# Patient Record
Sex: Male | Born: 1945 | Race: Black or African American | Hispanic: No | Marital: Single | Smoking: Never smoker
Health system: Southern US, Community
[De-identification: ages and names within clinical notes are randomized; demographics above are authoritative.]

## PROBLEM LIST (undated history)

## (undated) DIAGNOSIS — D126 Benign neoplasm of colon, unspecified: Secondary | ICD-10-CM

## (undated) DIAGNOSIS — N183 Chronic kidney disease, stage 3 unspecified: Secondary | ICD-10-CM

## (undated) DIAGNOSIS — K5909 Other constipation: Secondary | ICD-10-CM

## (undated) DIAGNOSIS — Z86010 Personal history of colonic polyps: Secondary | ICD-10-CM

## (undated) DIAGNOSIS — F4310 Post-traumatic stress disorder, unspecified: Secondary | ICD-10-CM

## (undated) DIAGNOSIS — I10 Essential (primary) hypertension: Secondary | ICD-10-CM

## (undated) DIAGNOSIS — F329 Major depressive disorder, single episode, unspecified: Secondary | ICD-10-CM

## (undated) DIAGNOSIS — K635 Polyp of colon: Secondary | ICD-10-CM

## (undated) HISTORY — DX: Major depressive disorder, single episode, unspecified: F32.9

## (undated) HISTORY — PX: COLONOSCOPY: SHX174

## (undated) HISTORY — DX: Other disorders of bilirubin metabolism: E80.6

## (undated) HISTORY — DX: Post-traumatic stress disorder, unspecified: F43.10

## (undated) HISTORY — DX: Polyp of colon: K63.5

## (undated) HISTORY — DX: Other constipation: K59.09

## (undated) HISTORY — PX: UPPER GASTROINTESTINAL ENDOSCOPY: SHX188

## (undated) HISTORY — DX: Chronic kidney disease, stage 3 unspecified: N18.30

## (undated) HISTORY — DX: Personal history of colonic polyps: Z86.010

## (undated) HISTORY — DX: Essential (primary) hypertension: I10

---
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
## 2010-01-18 ENCOUNTER — Emergency Department (HOSPITAL_COMMUNITY): Admission: EM | Admit: 2010-01-18 | Discharge: 2010-01-18 | Payer: Self-pay | Admitting: Family Medicine

## 2010-02-17 ENCOUNTER — Ambulatory Visit: Payer: Self-pay | Admitting: Internal Medicine

## 2010-02-17 ENCOUNTER — Encounter: Payer: Self-pay | Admitting: Internal Medicine

## 2010-02-17 DIAGNOSIS — I10 Essential (primary) hypertension: Secondary | ICD-10-CM

## 2010-02-17 DIAGNOSIS — R0989 Other specified symptoms and signs involving the circulatory and respiratory systems: Secondary | ICD-10-CM | POA: Insufficient documentation

## 2010-02-17 HISTORY — DX: Essential (primary) hypertension: I10

## 2010-03-07 ENCOUNTER — Telehealth (INDEPENDENT_AMBULATORY_CARE_PROVIDER_SITE_OTHER): Payer: Self-pay | Admitting: *Deleted

## 2010-03-08 ENCOUNTER — Ambulatory Visit: Payer: Self-pay | Admitting: Internal Medicine

## 2010-03-08 LAB — CONVERTED CEMR LAB
AST: 57 units/L — ABNORMAL HIGH (ref 0–37)
Albumin: 4.1 g/dL (ref 3.5–5.2)
CO2: 31 meq/L (ref 19–32)
Chloride: 102 meq/L (ref 96–112)
Glucose, Bld: 106 mg/dL — ABNORMAL HIGH (ref 70–99)
HDL: 44.1 mg/dL (ref >39.00)
PSA: 0.82 ng/mL (ref 0.10–4.00)
Potassium: 4.5 meq/L (ref 3.5–5.1)
Sodium: 141 meq/L (ref 135–145)
TSH: 0.95 microintl units/mL (ref 0.35–5.50)
Total Bilirubin: 0.7 mg/dL (ref 0.3–1.2)
Total CHOL/HDL Ratio: 4
Triglycerides: 61 mg/dL (ref 0.0–149.0)
VLDL: 12.2 mg/dL (ref 0.0–40.0)

## 2010-03-11 ENCOUNTER — Ambulatory Visit: Payer: Self-pay | Admitting: Internal Medicine

## 2010-03-14 ENCOUNTER — Ambulatory Visit: Payer: Self-pay | Admitting: Internal Medicine

## 2010-05-17 NOTE — Assessment & Plan Note (Signed)
Summary: FU/NWS   Vital Signs:  Patient profile:   65 year old male Height:      67 inches Weight:      213 pounds BMI:     33.48 O2 Sat:      99 % on Room air Temp:     98.1 degrees F oral Pulse rate:   56 / minute Pulse rhythm:   regular BP sitting:   118 / 76  (left arm) Cuff size:   large  Vitals Entered By: Rock Nephew CMA (March 14, 2010 4:40 PM)  O2 Flow:  Room air CC: Patient here for follow up/ lab results  Does patient need assistance? Functional Status Self care Ambulation Normal   Primary Care Provider:  Jacques Navy MD  CC:  Patient here for follow up/ lab results.  History of Present Illness: Patient returns for BP follow-up. Reviewed initial H&P with the patient - no additions or corrections.   In the interval he has done well with medication. Generally BP is well controlled. He does report that he has had to take clonidine on occasion but not on a regular basis. He did have follow-up labs Nov 22nd with normal renal function.  Current Medications (verified): 1)  Clonidine Hcl 0.1 Mg Tabs (Clonidine Hcl) .Marland Kitchen.. 1 Tablet Daily 2)  Lisinopril-Hydrochlorothiazide 10-12.5 Mg Tabs (Lisinopril-Hydrochlorothiazide) .Marland Kitchen.. 1 By Mouth Once Daily For Blood Pressure  Allergies (verified): No Known Drug Allergies  Past History:  Past Medical History: Benign colon polyp removed in 1970s possible PTSD - [place] Veteran Hypertension PMH-FH-SH reviewed-no changes except otherwise noted  Review of Systems  The patient denies anorexia, fever, weight loss, weight gain, chest pain, dyspnea on exertion, headaches, abdominal pain, severe indigestion/heartburn, incontinence, suspicious skin lesions, difficulty walking, unusual weight change, enlarged lymph nodes, and breast masses.    Physical Exam  General:  WNWD AA male in no distress Head:  Normocephalic and atraumatic without obvious abnormalities. No apparent alopecia or balding. Eyes:  pupils equal and  pupils round.   Neck:  supple.   Lungs:  Normal respiratory effort, chest expands symmetrically. Lungs are clear to auscultation, no crackles or wheezes. Heart:  Normal rate and regular rhythm. S1 and S2 normal without gallop, murmur, click, rub or other extra sounds. Abdomen:  soft and non-tender.   Neurologic:  alert & oriented X3, gait normal, and DTRs symmetrical and normal.   Skin:  turgor normal and color normal.     Impression & Recommendations:  Problem # 1:  UNSPECIFIED ESSENTIAL HYPERTENSION (ICD-401.9)  His updated medication list for this problem includes:    Clonidine Hcl 0.1 Mg Tabs (Clonidine hcl) .Marland Kitchen... 1 tablet daily    Lisinopril-hydrochlorothiazide 10-12.5 Mg Tabs (Lisinopril-hydrochlorothiazide) .Marland Kitchen... 1 by mouth once daily for blood pressure  BP today: 118/76 Prior BP: 148/92 (02/17/2010)  Labs Reviewed: K+: 4.5 (03/08/2010) Creat: : 1.3 (03/08/2010)     Good control on present medications and will continue the same  Problem # 2:  Preventive Health Care (ICD-V70.0) Reviewed all his labs: normal results including very good lipid panel. He will return in 6 months.   Complete Medication List: 1)  Clonidine Hcl 0.1 Mg Tabs (Clonidine hcl) .Marland Kitchen.. 1 tablet daily 2)  Lisinopril-hydrochlorothiazide 10-12.5 Mg Tabs (Lisinopril-hydrochlorothiazide) .Marland Kitchen.. 1 by mouth once daily for blood pressure Prescriptions: LISINOPRIL-HYDROCHLOROTHIAZIDE 10-12.5 MG TABS (LISINOPRIL-HYDROCHLOROTHIAZIDE) 1 by mouth once daily for blood pressure  #30 x 12   Entered and Authorized by:   Jacques Navy MD  Signed by:   Jacques Navy MD on 03/14/2010   Method used:   Electronically to        [facility] (retail)       [address]       [place], [place]  [id number]       Ph: [phone]       Fax: [phone]   RxID:   [id number] CLONIDINE HCL 0.1 MG TABS (CLONIDINE HCL) 1 tablet daily  #30 x 12   Entered and Authorized by:   Jacques Navy MD    Signed by:   Jacques Navy MD on 03/14/2010   Method used:   Electronically to        [facility] (retail)       [address]       [place], [place]  [id number]       Ph: [phone]       Fax: [phone]   RxID:   [id number]    Orders Added: 1)  Est. Patient Level III [67893]

## 2010-05-17 NOTE — Progress Notes (Signed)
Summary: PT DUE FOR OV  Phone Note From Pharmacy   Caller: [employer] [phone] opt 3 order # [id number] Summary of Call: [facility] is req RF for Simvastatin 20mg . This is not on med list. OK?  Initial call taken by: Lamar Sprinkles, CMA,  March 07, 2010 2:49 PM  Follow-up for Phone Call        not reviewed at his one and only visit. He was to have labs for which he has not returned.  He needs to have labs, including lipid panel and then ROV and then Rx if needed Follow-up by: Jacques Navy MD,  March 07, 2010 5:33 PM  Additional Follow-up for Phone Call Additional follow up Details #1::        No answer on pt's home #. Pt needs return office visit this or next week with Fasting lipid panel, CBC, and LFTs prior  Please help set up, [facility] Additional Follow-up by: Lamar Sprinkles, CMA,  March 07, 2010 5:54 PM    Additional Follow-up for Phone Call Additional follow up Details #2::    Pt has appt for 1128 @3 :10 pm. Pt states he had labwork this am. Follow-up by: Verdell Face,  March 08, 2010 2:39 PM

## 2010-05-17 NOTE — Assessment & Plan Note (Signed)
Summary: NEW / [name]  #   Vital Signs:  Patient profile:   65 year old male Height:      67 inches Weight:      215 pounds BMI:     33.80 O2 Sat:      97 % on Room air Temp:     98.2 degrees F oral Pulse rate:   53 / minute BP sitting:   148 / 92  (left arm) Cuff size:   regular  Vitals Entered By: Bill Salinas CMA (February 17, 2010 1:41 PM)  O2 Flow:  Room air CC: new pt here to est care with primary MD/ ab   CC:  new pt here to est care with primary MD/ ab.  History of Present Illness: [name] comes in today as a new patient to be evaluated for a recent diagnosis of hypertension.  He states that he has never had a problem with blood pressue in the past, but recently he had a friend check his blood pressure with his at home machine and got an elevated reading.  He went to urgent care at [facility] where his blood pressure was taken to be 208/101 and then 210/105 an hour later.  HE denied any symptoms of increase blood pressure at this time.  A BMP was obtained and showed normal values (Cr = 1.4).  He wa placed on Clonidine 0.1 mg and told him to set up a follow up appointment with a primary care physician.    At presentation today his blood pressure was 148/92.  He deniesw any headaches, blurry vision, tinitis, or any other pain.  He does report some feeling of lightheadedness when he stands but only occasionally.  He lives comfortably at home with his 2 brothers.  He is able to perform his ADLs well in taking care of his finances and house.  He interacts well socially but does report some problems with depression secondary to his time in [place] and the resent passing of his mother.     Of note the patients last colonoscopy was back in the 1970s and he had benign colon polyps removed. He has not had a pneumonnia vaccine and denies a flu shot today.  Preventive Screening-Counseling & Management  Alcohol-Tobacco     Alcohol drinks/day: 0     Smoking Status:  never  Caffeine-Diet-Exercise     Does Patient Exercise: no  Hep-HIV-STD-Contraception     Sun Exposure-Excessive: no  Safety-Violence-Falls     Seat Belt Use: yes     Helmet Use: n/a     Firearms in the Home: no firearms in the home     Smoke Detectors: no     Violence in the Home: no risk noted     Sexual Abuse: no     Fall Risk: low fall risk  Current Medications (verified): 1)  Clonidine Hcl 0.1 Mg Tabs (Clonidine Hcl) .Marland Kitchen.. 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 Lives with his 2 brothers Currently sexually active - uses protection Smoking Status:  never Does Patient Exercise:  no Sun Exposure-Excessive:  no Seat Belt Use:  yes Fall Risk:  low fall risk  Review of Systems       Patient complains of some depression - mother passed at the begining of October and has some continual problems since being in [place].  Physical Exam  General:  alert, well-developed, and well-nourished.  alert, well-developed, and well-nourished.   Head:  normocephalic and atraumatic.  normocephalic and atraumatic.   Eyes:  pupils  equal, pupils round, pupils reactive to light, no retinal abnormalitiies, and no nystagmus.  C&S clear.pupils equal, pupils round, pupils reactive to light, no retinal abnormalitiies, and no nystagmus.   Ears:  R ear normal, L ear normal, and no external deformities.  R ear normal, L ear normal, and no external deformities.   Nose:  no external deformity, no external erythema, no nasal discharge, no mucosal pallor, no mucosal edema, and no active bleeding or clots.  no external deformity, no external erythema, no nasal discharge, no mucosal pallor, no mucosal edema, and no active bleeding or clots.   Mouth:  good dentition, pharynx pink and moist, no erythema, and no exudates.  good dentition, pharynx pink and moist, no erythema, and no exudates.   Neck:  supple, full ROM, no masses, no carotid bruits, no cervical lymphadenopathy, and no neck tenderness.  supple, full ROM, no masses, no carotid bruits, no cervical lymphadenopathy, and no neck tenderness.   Chest Wall:  no deformities, no tenderness, and no masses.  no deformities, no tenderness, and no masses.   Lungs:  normal respiratory effort, normal breath sounds, no dullness, no crackles, and no wheezes.  normal respiratory effort, normal breath sounds, no dullness, no crackles, and no wheezes.   Heart:  regular rhythm, no murmur, no gallop, no rub, no thrills, PMI normal, and bradycardia.  1 second capillary refill timeregular rhythm, no murmur, no gallop, no rub, no thrills, PMI normal, and bradycardia.   Abdomen:  soft, non-tender, normal bowel sounds, no distention, no masses, no guarding, no rigidity, no rebound tenderness, no abdominal hernia, and no hepatomegaly.  soft, non-tender, normal bowel sounds, no distention, no masses, no guarding, no rigidity, no rebound tenderness, no abdominal hernia, and no hepatomegaly.   Msk:  normal ROM, no joint tenderness, no joint swelling, no joint warmth, no joint deformities, no crepitation, and no muscle  atrophy.  normal ROM, no joint tenderness, no joint swelling, no joint warmth, no joint deformities, no crepitation, and no muscle atrophy.   Pulses:  R radial normal, R posterior tibial normal, R dorsalis pedis normal, L radial normal, L posterior tibial normal, and L dorsalis pedis normal.  R radial normal, R posterior tibial normal, R dorsalis pedis normal, L radial normal, L posterior tibial normal, and L dorsalis pedis normal.   Neurologic:  alert & oriented X3, cranial nerves II-XII intact, strength normal in all extremities, sensation intact to light touch, sensation intact to pinprick, gait normal, and DTRs symmetrical and normal.  alert & oriented X3, cranial nerves II-XII intact, strength  normal in all extremities, sensation intact to light touch, sensation intact to pinprick, gait normal, and DTRs symmetrical and normal.   Skin:  turgor normal and color normal.  turgor normal and color normal.   Cervical Nodes:  no anterior cervical adenopathy and no posterior cervical adenopathy.  no anterior cervical adenopathy and no posterior cervical adenopathy.   Psych:  Oriented X3, memory intact for recent and remote, normally interactive, good eye contact, and subdued.  Oriented X3, memory intact for recent and remote, normally interactive, and good eye contact.     Impression & Recommendations:  Problem # 1:  UNSPECIFIED ESSENTIAL HYPERTENSION (ICD-401.9) The patient has confirmed hypertension.  A BMP was obtained at the ED visit and showed good renal function.  A 12 Lead EKG was performed today and showed sinus bradycardia with no signs of LVH.  Due to the severity of the rise in his blood pressure at the ED a two drug combination therapy is the best choice to keep his blood pressure under control.  A CXR still needs to be obtained to evaluate for any current heart changes and to establish a baseline for future comparison.  Plan:  CXR today           Lisinopril/HCTZ 10/12.5mg  once a day            Stop taking Clonidine regularly           Take one Clonidine 0.1mg  if blood pressure is >180 systolic or >100 diastolic and call office           LIfestyle changes - monitor diet and increase exercise  His updated medication list for this problem includes:    Clonidine Hcl 0.1 Mg Tabs (Clonidine hcl) .Marland Kitchen... 1 tablet daily    Lisinopril-hydrochlorothiazide 10-12.5 Mg Tabs (Lisinopril-hydrochlorothiazide) .Marland Kitchen... 1 by mouth once daily for blood pressure  Orders: T-2 View CXR (71020TC)  His updated medication list for this problem includes:    Clonidine Hcl 0.1 Mg Tabs (Clonidine hcl) .Marland Kitchen... 1 tablet daily    Lisinopril-hydrochlorothiazide 10-12.5 Mg Tabs (Lisinopril-hydrochlorothiazide) .Marland Kitchen... 1 by mouth once daily for blood pressure  Problem # 2:  GRIEF REACTION (ICD-309.0) His mother passed about a month ago and has been dealing with his greif over the loss, but does seem to be more depressed than normal.  Due to his history in [place] and possible PTSD he should be monitored for any other signs of depression.  Plan:  Re-evaluate at next office visit in 3 weeks           Call if needed  Problem # 3:  Preventive Health Care (ICD-V70.0) Patient is an over-weight African American male who presents to day with a new diagnosis of hypertension which will now be managed with lisinopril and HCTZ.    The focus of this visit was mainly to get his hypertension under control and more preventative health care will be addressed.  He does report being functional in his ADLs and can maintain his finances well.  He is capable of normal social interaction.  Some history of depression that should be evaluated on next visit.    Plan:  Follow up in 3 weeks           Fasting lipid panel, CBC, and TFTs prior to next office visit  Complete Medication List: 1)  Clonidine Hcl 0.1 Mg Tabs (Clonidine hcl) .Marland Kitchen.. 1 tablet daily 2)  Lisinopril-hydrochlorothiazide 10-12.5 Mg Tabs (Lisinopril-hydrochlorothiazide) .Marland Kitchen.. 1 by  mouth once daily for blood  pressure  Patient Instructions: 1)  Newly diagnosed hypertension - will start lisinopril/hct 10/12.5 mg once a day. Monitor your blood pressure at home. If the top number is greater than 180 and/or the bottom number is greater than 100 you should take a clonidine. If this happens frequently you need to call me. You will return for a follow-up visit in 3 weeks with lab work 2 working days before your visit. Call at anytime with questions: [phone], option #3, option #1 and leave a message.  2)  Health maintenance - will get routine labs when you have your blood drawn and we will talk about followp-up colonoscopy. Prescriptions: LISINOPRIL-HYDROCHLOROTHIAZIDE 10-12.5 MG TABS (LISINOPRIL-HYDROCHLOROTHIAZIDE) 1 by mouth once daily for blood pressure  #30 x 12   Entered and Authorized by:   Jacques Navy MD   Signed by:   Jacques Navy MD on 02/17/2010   Method used:   Electronically to        [facility] (retail)       [address]       [place], [place]  [id number]       Ph: [phone]       Fax: [phone]   RxID:   [id number]    Orders Added: 1)  T-2 View CXR [[id number]] 2)  New Patient Level III [99203]   DG CHEST 2 VIEW - [id number]   Clinical Data: Unspecified essential hypertension.   CHEST - 2 VIEW 02/17/2010:   Comparison: None.   Findings: Cardiac silhouette normal in size.  Thoracic aorta tortuous, consistent with the history of hypertension.  Hilar and mediastinal contours otherwise unremarkable.  Lungs clear. Bronchovascular markings normal.  No pleural effusions.  Visualized bony thorax intact with mild degenerative changes.   IMPRESSION: No acute cardiopulmonary disease.   Read By:  Arnell Sieving,  Judie Petit.D.

## 2010-06-30 LAB — POCT I-STAT, CHEM 8
Calcium, Ion: 1.2 mmol/L (ref 1.12–1.32)
Chloride: 104 mEq/L (ref 96–112)
Glucose, Bld: 96 mg/dL (ref 70–99)
HCT: 49 % (ref 39.0–52.0)
Hemoglobin: 16.7 g/dL (ref 13.0–17.0)
Potassium: 4.2 mEq/L (ref 3.5–5.1)

## 2010-12-15 ENCOUNTER — Encounter: Payer: Self-pay | Admitting: Internal Medicine

## 2010-12-15 ENCOUNTER — Ambulatory Visit (INDEPENDENT_AMBULATORY_CARE_PROVIDER_SITE_OTHER): Payer: BC Managed Care – PPO | Admitting: Internal Medicine

## 2010-12-15 ENCOUNTER — Other Ambulatory Visit (INDEPENDENT_AMBULATORY_CARE_PROVIDER_SITE_OTHER): Payer: BC Managed Care – PPO

## 2010-12-15 VITALS — BP 128/82 | HR 61 | Temp 97.8°F | Wt 207.0 lb

## 2010-12-15 DIAGNOSIS — I10 Essential (primary) hypertension: Secondary | ICD-10-CM

## 2010-12-15 DIAGNOSIS — K648 Other hemorrhoids: Secondary | ICD-10-CM

## 2010-12-15 LAB — COMPREHENSIVE METABOLIC PANEL
ALT: 24 U/L (ref 0–53)
AST: 30 U/L (ref 0–37)
CO2: 30 mEq/L (ref 19–32)
Chloride: 100 mEq/L (ref 96–112)
Creatinine, Ser: 1.5 mg/dL (ref 0.4–1.5)
Sodium: 137 mEq/L (ref 135–145)
Total Bilirubin: 0.7 mg/dL (ref 0.3–1.2)
Total Protein: 8 g/dL (ref 6.0–8.3)

## 2010-12-15 MED ORDER — HYDROCORTISONE ACETATE 25 MG RE SUPP: 25.0000 mg | Freq: Two times a day (BID) | RECTAL | Status: AC

## 2010-12-15 NOTE — Progress Notes (Signed)
  Subjective:    Patient ID: [name], male    DOB: 24-May-1945, 65 y.o.   MRN: [id number]  HPI [name] presents for evaluation of a nodule at the anus. It is a painful. No bleeding. He also has an irregular bowel habit which does do better with dulcolax or fiber.  He is followed for blood pressure - doing very well on his present medications.  Past Medical History  Diagnosis Date  . Benign colon polyp   . PTSD (post-traumatic stress disorder)     possible [place] vet  . Hypertension    No past surgical history on file. No family history on file. History   Social History  . Marital Status: Single    Spouse Name: N/A    Number of Children: N/A  . Years of Education: N/A   Occupational History  . Not on file.   Social History Main Topics  . Smoking status: Not on file  . Smokeless tobacco: Not on file  . Alcohol Use: Not on file  . Drug Use: Not on file  . Sexually Active: Not on file   Other Topics Concern  . 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 NST. Firm nodule internal to the sphincter that is not tender.       Assessment & Plan:  Internal hemorrhoid - no frank bleeding but some blood on tissue with BM and a hesitancy in BMs.  Plan - routine care: bulk laxatives, sitz bath for flare, anusol HC 25 mg suppositories as needed.

## 2010-12-15 NOTE — Patient Instructions (Signed)
Blood pressure is very well controlled. Please continue your present medication. Will check routine lab today.  Hemorrhoid - internal hemorrhoid at the 5:00 position. Plan - a bulk laxative, i.e. Metamucil, fibercon, etc, daily to promote an easy BM without strain. Anusol cortisone suppository for the hemorrhoid: take a sitz bath or hold a warm washcloth against the anus for 5 minutes or so then use the suppository.    Hemorrhoids Hemorrhoids are dilated (enlarged) veins around the rectum. Sometimes clots will form in the veins. This makes them swollen and painful. These are called thrombosed hemorrhoids. Causes of hemorrhoids include:  Pregnancy: this increases the pressure in the hemorrhoidal veins.   Constipation.   Straining to have a bowel movement.  HOME CARE INSTRUCTIONS  Eat a well balanced diet and drink 6 to 8 glasses of water every day to avoid constipation. You may also use a bulk laxative.   Avoid straining to have bowel movements.   Keep anal area dry and clean.   Only take over-the-counter or prescription medicines for pain, discomfort, or fever as directed by your caregiver.  If thrombosed:  Take hot sitz baths for 20 to 30 minutes, 3 to 4 times per day.   If the hemorrhoids are very tender and swollen, place ice packs on area as tolerated. Using ice packs between sitz baths may be helpful. Fill a plastic bag with ice and use a towel between the bag of ice and your skin.   Special creams and suppositories (Anusol, Nupercainal, Wyanoids) may be used or applied as directed.   Do not use a donut shaped pillow or sit on the toilet for long periods. This increases blood pooling and pain.   Move your bowels when your body has the urge; this will require less straining and will decrease pain and pressure.   Only take over-the-counter or prescription medicines for pain, discomfort, or fever as directed by your caregiver.  SEEK MEDICAL CARE IF:  You have increasing pain  and swelling that is not controlled with your prescription.   You have uncontrolled bleeding.   You have an inability or difficulty having a bowel movement.   You have pain or inflammation outside the area of the hemorrhoids.   You have chills and/or an oral temperature above 100 that lasts for 2 days or longer, or as your caregiver suggests.  MAKE SURE YOU:    Understand these instructions.   Will watch your condition.   Will get help right away if you are not doing well or get worse.  Document Released: 03/31/2000 Document Re-Released: 03/16/2008 [facility] Patient Information 2011 [place], [place].       What is this medicine? HYDROCORTISONE (hye droe KOR ti sone) is a corticosteroid. It is used to decrease swelling, itching, and pain that is caused by minor skin irritations or by hemorrhoids. This medicine may be used for other purposes; ask your health care Beryle Bagsby or pharmacist if you have questions.   What should I tell my health care Heidee Audi before I take this medicine? They need to know if you have any of these conditions: -an unusual or allergic reaction to hydrocortisone, corticosteroids, other medicines, foods, dyes, or preservatives -pregnant or trying to get pregnant -breast-feeding   How should I use this medicine? This medicine is for rectal use only. Do not take by mouth. Wash your hands before and after use. Take off the foil wrapping. Wet the tip of the suppository with cold tap water to make it easier to  use. Lie on your side with your lower leg straightened out and your upper leg bent forward toward your stomach. Lift upper buttock to expose the rectal area. Apply gentle pressure to insert the suppository completely into the rectum, pointed end first. Hold buttocks together for a few seconds. Remain lying down for about 15 minutes to avoid having the suppository come out. Do not use more often than directed.   Talk to your pediatrician regarding the use of this  medicine in children. Special care may be needed.   Overdosage: If you think you have taken too much of this medicine contact a poison control center or emergency room at once. NOTE: This medicine is only for you. Do not share this medicine with others.   What if I miss a dose? If you miss a dose, use it as soon as you can. If it is almost time for your next dose, use only that dose. Do not use double or extra doses.   What may interact with this medicine? Interactions are not expected. Do not use any other rectal products on the affected area without telling your doctor or health care professional.   This list may not describe all possible interactions. Give your health care Corbin Hott a list of all the medicines, herbs, non-prescription drugs, or dietary supplements you use. Also tell them if you smoke, drink alcohol, or use illegal drugs. Some items may interact with your medicine.   What should I watch for while using this medicine? Visit your doctor or health care professional for regular checks on your progress. Tell your doctor or health care professional if your symptoms do not improve after a few days of use. Do not use if there is blood in your stools.   If you get any type of infection while using this medicine, you may need to stop using this medicine until our infections clears up. Ask your doctor or health care professional for advice.   What side effects may I notice from receiving this medicine? Side effects that you should report to your doctor or health care professional as soon as possible: -bloody or black, tarry stools -painful, red, pus filled blisters in hair follicles -rectal pain, burning or bleeding after use of medicine   Side effects that usually do not require medical attention (report to your doctor or health care professional if they continue or are bothersome): -changes in skin color -dry skin -itching or irritation   This list may not describe all possible  side effects. Call your doctor for medical advice about side effects. You may report side effects to FDA at 1-800-FDA-1088.   Where should I keep my medicine? Keep out of the reach of children.   Store at room temperature between 20 and 25 degrees C (68 and 77 degrees F). Protect from heat and freezing. Throw away any unused medicine after the expiration date.   NOTE:This sheet is a summary. It may not cover all possible information. If you have questions about this medicine, talk to your doctor, pharmacist, or health care Musa Rewerts.      2011, [employer] Standard.

## 2010-12-17 NOTE — Assessment & Plan Note (Signed)
BP Readings from Last 3 Encounters:  12/15/10 128/82  03/14/10 118/76  02/17/10 148/92   Good control. Continue present regimen.

## 2010-12-19 ENCOUNTER — Encounter: Payer: Self-pay | Admitting: Internal Medicine

## 2011-03-22 ENCOUNTER — Other Ambulatory Visit: Payer: Self-pay | Admitting: *Deleted

## 2011-03-22 NOTE — Telephone Encounter (Signed)
Opened in error

## 2011-03-27 ENCOUNTER — Other Ambulatory Visit: Payer: Self-pay | Admitting: *Deleted

## 2011-03-28 ENCOUNTER — Other Ambulatory Visit: Payer: Self-pay | Admitting: Internal Medicine

## 2011-03-28 MED ORDER — LISINOPRIL-HYDROCHLOROTHIAZIDE 10-12.5 MG PO TABS: 1.0000 | ORAL_TABLET | Freq: Every day | ORAL | Status: DC

## 2011-03-28 NOTE — Telephone Encounter (Signed)
Rx faxed into [facility] on [address].

## 2011-04-27 ENCOUNTER — Other Ambulatory Visit: Payer: Self-pay | Admitting: *Deleted

## 2011-04-27 MED ORDER — SIMVASTATIN 20 MG PO TABS: 20.0000 mg | ORAL_TABLET | Freq: Every day | ORAL | Status: DC

## 2011-04-27 MED ORDER — LOSARTAN POTASSIUM 50 MG PO TABS: 50.0000 mg | ORAL_TABLET | Freq: Every day | ORAL | Status: DC

## 2011-05-15 ENCOUNTER — Other Ambulatory Visit: Payer: Self-pay | Admitting: *Deleted

## 2011-05-15 MED ORDER — LOSARTAN POTASSIUM 50 MG PO TABS: 50.0000 mg | ORAL_TABLET | Freq: Every day | ORAL | Status: DC

## 2012-01-29 ENCOUNTER — Other Ambulatory Visit: Payer: Self-pay | Admitting: Internal Medicine

## 2012-04-17 DIAGNOSIS — D126 Benign neoplasm of colon, unspecified: Secondary | ICD-10-CM

## 2012-04-17 HISTORY — DX: Benign neoplasm of colon, unspecified: D12.6

## 2013-01-30 ENCOUNTER — Telehealth: Payer: Self-pay | Admitting: Internal Medicine

## 2013-01-30 NOTE — Telephone Encounter (Signed)
Pt called to say his BP is elevated. 121/113 this morning around 8:30.  LOV 2012.  He is also going to call the [place] where he has been seeing someone.

## 2013-01-30 NOTE — Telephone Encounter (Signed)
Pt walked into [facility] states his BP was elevated.  BP checked 122/78 left, 126/80 right.  Pt has been taking Nifedipine 30mg , last dose approx 9am.  Pt scheduled appoint with Dr Debby Bud on 10.20.14 at 11:30am

## 2013-02-03 ENCOUNTER — Ambulatory Visit (INDEPENDENT_AMBULATORY_CARE_PROVIDER_SITE_OTHER): Payer: BC Managed Care – PPO | Admitting: Internal Medicine

## 2013-02-03 VITALS — BP 118/82 | HR 62 | Temp 98.6°F | Wt 205.0 lb

## 2013-02-03 DIAGNOSIS — F4310 Post-traumatic stress disorder, unspecified: Secondary | ICD-10-CM | POA: Insufficient documentation

## 2013-02-03 DIAGNOSIS — I10 Essential (primary) hypertension: Secondary | ICD-10-CM

## 2013-02-03 NOTE — Assessment & Plan Note (Signed)
Patient reports dx made at [facility] and has been seeking talk therapy there. Patient also reports that they recommended a medication to help him sleep (maybe Prazosin?), but has not taken it. Significant symptoms of depression--admits to past SI, low energy, periods of low mood.   Plan:  Will more fully assess at follow up CPE.

## 2013-02-03 NOTE — Assessment & Plan Note (Addendum)
Blood pressure is excellent today. Unclear exactly how his BPs have been in the past since he has been seen outside of this office.   Plan:  Will recheck at follow-up CPE.

## 2013-02-03 NOTE — Progress Notes (Signed)
Subjective:     Patient ID: [name], male   DOB: [birth year], 67 y.o.   MRN: [id number]  HPI [name] is a 67 year old man with a history of HTN and PTSD who presents for evaluation of his blood pressure. He has been seeing his doctor at the [place] in [place] and was last here about 2 years ago. He checked his BP at a friend's and it was high--maybe about 180s. Last week, a nurse checked his BP here and reported that it was ok.   He was taken off of his lisinopril-HCTZ 6 months ago at the [place] for angioedema--"my face and tongue were very swollen." He was started on nifedipine 30 mg once daily.   He does admit to an intermittent HA 1-2x daily. They feel like feeling "off or not clear" but there is no pain. During these headaches, his vision gets slightly burred. He has not taken any medicines for these HA, but the doctors at the [place] recommended that he started a medicine for sleep--maybe Prazosin. No photophobia or N/V. No hearing racing or chest palpitations.   He admits to some periods of low mood, and at times is less able to enjoy his normal activities. He feels like he has low energy. He has been diagnosed with PTSD in the past and has been receiving talk therapy with the [place]. He admits to some suicidal thoughts in the past during his periods of low mood, most recently 4-5 months ago. He recently called for another appointment at the [place], but does not have one currently scheduled.   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     Objective:   Physical Exam Filed Vitals:   02/03/13 1209  BP: 118/82  Pulse: 62  Temp: 98.6 F (37 C)   MSE: Affect is flat. Thought content includes concerns over his blood pressure. No behaviors concerning for responding to internal stimuli. Does admit to past SI, but denies any current SI.     Assessment:     [name] is a 67 year old  man with a history of HTN and depression who presents for a blood pressure recheck. Given the many changes in his medical history since he was last here, he will be scheduled for a CPE later this week or early next week.     Plan:     See plan by problem list.

## 2013-02-11 ENCOUNTER — Ambulatory Visit: Payer: BC Managed Care – PPO | Admitting: Internal Medicine

## 2013-02-11 DIAGNOSIS — Z0289 Encounter for other administrative examinations: Secondary | ICD-10-CM

## 2013-10-21 ENCOUNTER — Telehealth: Payer: Self-pay | Admitting: *Deleted

## 2013-10-21 NOTE — Telephone Encounter (Signed)
Ok with me 

## 2013-10-21 NOTE — Telephone Encounter (Signed)
Patient would like to know if Dr Jenny Reichmann will accept him as a new patient.  Patient is a former Dr Norrins patient and would come the same day as his wife [name].  Mrs Valere Dross has already scheduled an appointment with Dr Jenny Reichmann on 12/30/13 and Mr Hallum would like to do the same if possible?

## 2013-12-30 ENCOUNTER — Encounter: Payer: Self-pay | Admitting: Internal Medicine

## 2013-12-30 ENCOUNTER — Ambulatory Visit (INDEPENDENT_AMBULATORY_CARE_PROVIDER_SITE_OTHER): Payer: BC Managed Care – PPO | Admitting: Internal Medicine

## 2013-12-30 VITALS — BP 118/60 | HR 64 | Temp 98.2°F | Ht 67.0 in | Wt 217.0 lb

## 2013-12-30 DIAGNOSIS — F329 Major depressive disorder, single episode, unspecified: Secondary | ICD-10-CM

## 2013-12-30 DIAGNOSIS — Z0000 Encounter for general adult medical examination without abnormal findings: Secondary | ICD-10-CM

## 2013-12-30 DIAGNOSIS — Z860100 Personal history of colon polyps, unspecified: Secondary | ICD-10-CM

## 2013-12-30 DIAGNOSIS — I10 Essential (primary) hypertension: Secondary | ICD-10-CM

## 2013-12-30 DIAGNOSIS — F3289 Other specified depressive episodes: Secondary | ICD-10-CM

## 2013-12-30 DIAGNOSIS — F32A Depression, unspecified: Secondary | ICD-10-CM

## 2013-12-30 DIAGNOSIS — Z0001 Encounter for general adult medical examination with abnormal findings: Secondary | ICD-10-CM | POA: Insufficient documentation

## 2013-12-30 HISTORY — DX: Major depressive disorder, single episode, unspecified: F32.9

## 2013-12-30 HISTORY — DX: Personal history of colon polyps, unspecified: Z86.0100

## 2013-12-30 HISTORY — DX: Personal history of colonic polyps: Z86.010

## 2013-12-30 MED ORDER — ASPIRIN EC 81 MG PO TBEC: 81.0000 mg | DELAYED_RELEASE_TABLET | Freq: Every day | ORAL | Status: DC

## 2013-12-30 NOTE — Progress Notes (Signed)
Pre visit review using our clinic review tool, if applicable. No additional management support is needed unless otherwise documented below in the visit note. 

## 2013-12-30 NOTE — Assessment & Plan Note (Signed)
stable overall by history and exam, recent data reviewed with pt, and pt to continue medical treatment as before,  to f/u any worsening symptoms or concerns BP Readings from Last 3 Encounters:  12/30/13 118/60  02/03/13 118/82  12/15/10 128/82

## 2013-12-30 NOTE — Patient Instructions (Signed)
Please continue all other medications as before  Please have the pharmacy call with any other refills you may need.  Please continue your efforts at being more active, low cholesterol diet, and weight control.  You are otherwise up to date with prevention measures today.  Please keep your appointments with your specialists as you may have planned  Please forward any Lab work from the [place], or bring to your next visit  Please return in 1 year for your yearly visit, or sooner if needed, with Lab testing done 3-5 days before

## 2013-12-30 NOTE — Progress Notes (Signed)
Subjective:    Patient ID: [name], male    DOB: 03/12/1946, 68 y.o.   MRN: [id number]  HPI  /Here for wellness and f/u;  Overall doing ok;  Pt denies CP, worsening SOB, DOE, wheezing, orthopnea, PND, worsening LE edema, palpitations, dizziness or syncope.  Pt denies neurological change such as new headache, facial or extremity weakness.  Pt denies polydipsia, polyuria, or low sugar symptoms. Pt states overall good compliance with treatment and medications, good tolerability, and has been trying to follow lower cholesterol diet.  Pt denies worsening depressive symptoms, suicidal ideation or panic. No fever, night sweats, wt loss, loss of appetite, or other constitutional symptoms.  Pt states good ability with ADL's, has low fall risk, home safety reviewed and adequate, no other significant changes in hearing or vision, and only occasionally active with exercise. Declines immunizations. Does not want albs today, but may do later this wk  Had some labs a few mo ago at the [place].  No complaints Past Medical History  Diagnosis Date  . Benign colon polyp   . PTSD (post-traumatic stress disorder)     possible [place] vet  . Hypertension   . Personal history of colonic polyps 12/30/2013    Per colonoscopy at [facility] per pt, about sept 2014  . Depression 12/30/2013   No past surgical history on file.  reports that he has quit smoking. He does not have any smokeless tobacco history on file. He reports that he does not drink alcohol or use illicit drugs. family history includes Hypertension in an other family member. Allergies  Allergen Reactions  . Lisinopril     Angioedema? Patient reports that his tongue and mouth became very swollen, was seen at [facility] for this in 2014.    No current outpatient prescriptions on file prior to visit.   No current facility-administered medications on file prior to visit.   Review of Systems Constitutional: Negative for increased diaphoresis, other activity, appetite  or other siginficant weight change  HENT: Negative for worsening hearing loss, ear pain, facial swelling, mouth sores and neck stiffness.   Eyes: Negative for other worsening pain, redness or visual disturbance.  Respiratory: Negative for shortness of breath and wheezing.   Cardiovascular: Negative for chest pain and palpitations.  Gastrointestinal: Negative for diarrhea, blood in stool, abdominal distention or other pain Genitourinary: Negative for hematuria, flank pain or change in urine volume.  Musculoskeletal: Negative for myalgias or other joint complaints.  Skin: Negative for color change and wound.  Neurological: Negative for syncope and numbness. other than noted Hematological: Negative for adenopathy. or other swelling Psychiatric/Behavioral: Negative for hallucinations, self-injury, decreased concentration or other worsening agitation.      Objective:   Physical Exam BP 118/60  Pulse 64  Temp(Src) 98.2 F (36.8 C) (Oral)  Ht 5\' 7"  (1.702 m)  Wt 217 lb (98.431 kg)  BMI 33.98 kg/m2  SpO2 95% VS noted,  Constitutional: Pt is oriented to person, place, and time. Appears well-developed and well-nourished.  Head: Normocephalic and atraumatic.  Right Ear: External ear normal.  Left Ear: External ear normal.  Nose: Nose normal.  Mouth/Throat: Oropharynx is clear and moist.  Eyes: Conjunctivae and EOM are normal. Pupils are equal, round, and reactive to light.  Neck: Normal range of motion. Neck supple. No JVD present. No tracheal deviation present.  Cardiovascular: Normal rate, regular rhythm, normal heart sounds and intact distal pulses.   Pulmonary/Chest: Effort normal and breath sounds without rales or wheezing  Abdominal:  Soft. Bowel sounds are normal. NT. No HSM  Musculoskeletal: Normal range of motion. Exhibits no edema.  Lymphadenopathy:  Has no cervical adenopathy.  Neurological: Pt is alert and oriented to person, place, and time. Pt has normal reflexes. No cranial  nerve deficit. Motor grossly intact Skin: Skin is warm and dry. No rash noted.  Psychiatric:  Has depressed mood and affect. Behavior is normal.     Assessment & Plan:

## 2013-12-30 NOTE — Assessment & Plan Note (Signed)
Mild persistent, is on what sounds like anti-depressant from the [place], cant recall name, declines any change

## 2013-12-30 NOTE — Assessment & Plan Note (Addendum)

## 2014-09-15 ENCOUNTER — Ambulatory Visit: Payer: BC Managed Care – PPO | Admitting: Internal Medicine

## 2014-09-15 DIAGNOSIS — R4689 Other symptoms and signs involving appearance and behavior: Secondary | ICD-10-CM | POA: Insufficient documentation

## 2014-09-15 DIAGNOSIS — Z0289 Encounter for other administrative examinations: Secondary | ICD-10-CM

## 2015-01-13 ENCOUNTER — Encounter: Payer: BC Managed Care – PPO | Admitting: Internal Medicine

## 2015-01-14 ENCOUNTER — Encounter: Payer: Self-pay | Admitting: Internal Medicine

## 2015-01-14 ENCOUNTER — Ambulatory Visit (INDEPENDENT_AMBULATORY_CARE_PROVIDER_SITE_OTHER): Payer: BC Managed Care – PPO | Admitting: Internal Medicine

## 2015-01-14 ENCOUNTER — Other Ambulatory Visit (INDEPENDENT_AMBULATORY_CARE_PROVIDER_SITE_OTHER): Payer: BC Managed Care – PPO

## 2015-01-14 VITALS — BP 126/86 | HR 67 | Temp 97.8°F | Ht 67.0 in | Wt 219.0 lb

## 2015-01-14 DIAGNOSIS — I10 Essential (primary) hypertension: Secondary | ICD-10-CM | POA: Diagnosis not present

## 2015-01-14 DIAGNOSIS — F32A Depression, unspecified: Secondary | ICD-10-CM

## 2015-01-14 DIAGNOSIS — F329 Major depressive disorder, single episode, unspecified: Secondary | ICD-10-CM

## 2015-01-14 DIAGNOSIS — Z86010 Personal history of colonic polyps: Secondary | ICD-10-CM | POA: Diagnosis not present

## 2015-01-14 DIAGNOSIS — Z0000 Encounter for general adult medical examination without abnormal findings: Secondary | ICD-10-CM | POA: Diagnosis not present

## 2015-01-14 LAB — HEPATIC FUNCTION PANEL
ALBUMIN: 4 g/dL (ref 3.5–5.2)
ALK PHOS: 63 U/L (ref 39–117)
ALT: 24 U/L (ref 0–53)
AST: 30 U/L (ref 0–37)
Bilirubin, Direct: 0.2 mg/dL (ref 0.0–0.3)
Total Bilirubin: 0.7 mg/dL (ref 0.2–1.2)
Total Protein: 7.1 g/dL (ref 6.0–8.3)

## 2015-01-14 LAB — URINALYSIS, ROUTINE W REFLEX MICROSCOPIC
Bilirubin Urine: NEGATIVE
HGB URINE DIPSTICK: NEGATIVE
Ketones, ur: NEGATIVE
LEUKOCYTES UA: NEGATIVE
Nitrite: NEGATIVE
Specific Gravity, Urine: 1.025 (ref 1.000–1.030)
TOTAL PROTEIN, URINE-UPE24: NEGATIVE
URINE GLUCOSE: NEGATIVE
UROBILINOGEN UA: 0.2 (ref 0.0–1.0)
pH: 6 (ref 5.0–8.0)

## 2015-01-14 LAB — CBC WITH DIFFERENTIAL/PLATELET
BASOS ABS: 0 10*3/uL (ref 0.0–0.1)
Basophils Relative: 0.5 % (ref 0.0–3.0)
EOS ABS: 0.1 10*3/uL (ref 0.0–0.7)
Eosinophils Relative: 1.1 % (ref 0.0–5.0)
HEMATOCRIT: 41.9 % (ref 39.0–52.0)
HEMOGLOBIN: 13.7 g/dL (ref 13.0–17.0)
LYMPHS PCT: 25.9 % (ref 12.0–46.0)
Lymphs Abs: 1.2 10*3/uL (ref 0.7–4.0)
MCHC: 32.6 g/dL (ref 30.0–36.0)
MCV: 88.4 fl (ref 78.0–100.0)
MONOS PCT: 8.5 % (ref 3.0–12.0)
Monocytes Absolute: 0.4 10*3/uL (ref 0.1–1.0)
Neutro Abs: 3 10*3/uL (ref 1.4–7.7)
Neutrophils Relative %: 64 % (ref 43.0–77.0)
Platelets: 147 10*3/uL — ABNORMAL LOW (ref 150.0–400.0)
RBC: 4.74 Mil/uL (ref 4.22–5.81)
RDW: 15 % (ref 11.5–15.5)
WBC: 4.6 10*3/uL (ref 4.0–10.5)

## 2015-01-14 LAB — LIPID PANEL
CHOLESTEROL: 148 mg/dL (ref 0–200)
HDL: 48.9 mg/dL (ref >39.00)
LDL CALC: 82 mg/dL (ref 0–99)
NonHDL: 98.98
Total CHOL/HDL Ratio: 3
Triglycerides: 83 mg/dL (ref 0.0–149.0)
VLDL: 16.6 mg/dL (ref 0.0–40.0)

## 2015-01-14 LAB — BASIC METABOLIC PANEL
BUN: 10 mg/dL (ref 6–23)
CALCIUM: 9 mg/dL (ref 8.4–10.5)
CO2: 30 mEq/L (ref 19–32)
CREATININE: 1.53 mg/dL — AB (ref 0.40–1.50)
Chloride: 103 mEq/L (ref 96–112)
GFR: 58.14 mL/min — AB (ref >60.00)
GLUCOSE: 91 mg/dL (ref 70–99)
Potassium: 3.7 mEq/L (ref 3.5–5.1)
SODIUM: 139 meq/L (ref 135–145)

## 2015-01-14 LAB — TSH: TSH: 1.07 u[IU]/mL (ref 0.35–4.50)

## 2015-01-14 LAB — PSA: PSA: 0.8 ng/mL (ref 0.10–4.00)

## 2015-01-14 MED ORDER — ESCITALOPRAM OXALATE 10 MG PO TABS: 10.0000 mg | ORAL_TABLET | Freq: Every day | ORAL | Status: DC

## 2015-01-14 MED ORDER — NIFEDIPINE ER OSMOTIC RELEASE 30 MG PO TB24: 30.0000 mg | ORAL_TABLET | Freq: Every day | ORAL | Status: DC

## 2015-01-14 NOTE — Patient Instructions (Addendum)
Please return for a Nurse visit if you change your mind about the pneumonia, tetanus (Td) or flu shots  Your EKG was [facility] today  You will be contacted regarding the referral for: colonoscopy  Please continue all other medications as before, and refills have been done if requested.  Please have the pharmacy call with any other refills you may need.  Please continue your efforts at being more active, low cholesterol diet, and weight control.  You are otherwise up to date with prevention measures today.  Please keep your appointments with your specialists as you may have planned  Please go to the LAB in the Basement (turn left off the elevator) for the tests to be done today  You will be contacted by phone if any changes need to be made immediately.  Otherwise, you will receive a letter about your results with an explanation, but please check with MyChart first.  Please remember to sign up for MyChart if you have not done so, as this will be important to you in the future with finding out test results, communicating by private email, and scheduling acute appointments online when needed.  Please return in 1 year for your yearly visit, or sooner if needed, with Lab testing done 3-5 days before

## 2015-01-14 NOTE — Assessment & Plan Note (Signed)
Ok for tx for lexapro 10 mg daily, declines referral counseling,  to f/u any worsening symptoms or concerns

## 2015-01-14 NOTE — Assessment & Plan Note (Signed)
stable overall by history and exam, recent data reviewed with pt, and pt to continue medical treatment as before,  to f/u any worsening symptoms or concerns bke BP Readings from Last 3 Encounters:  01/14/15 126/86  12/30/13 118/60  02/03/13 118/82

## 2015-01-14 NOTE — Progress Notes (Signed)
Pre visit review using our clinic review tool, if applicable. No additional management support is needed unless otherwise documented below in the visit note. 

## 2015-01-14 NOTE — Assessment & Plan Note (Signed)
Pt now states due for colonsocpy - ok for referral

## 2015-01-14 NOTE — Assessment & Plan Note (Signed)

## 2015-01-14 NOTE — Progress Notes (Signed)
Subjective:    Patient ID: [name], male    DOB: [birth year], 69 y.o.   MRN: [id number]  HPI Here for wellness and f/u;  Overall doing ok;  Pt denies Chest pain, worsening SOB, DOE, wheezing, orthopnea, PND, worsening LE edema, palpitations, dizziness or syncope.  Pt denies neurological change such as new headache, facial or extremity weakness.  Pt denies polydipsia, polyuria, or low sugar symptoms. Pt states overall good compliance with treatment and medications, good tolerability, and has been trying to follow appropriate diet.  Pt has had mild 2 wks worsening depressive symptoms, but no suicidal ideation or panic. No fever, night sweats, wt loss, loss of appetite, or other constitutional symptoms.  Pt states good ability with ADL's, has low fall risk, home safety reviewed and adequate, no other significant changes in hearing or vision, and only occasionally active with exercise. No complaints  Declines flu shot or other immunizations.  Past Medical History  Diagnosis Date  . Benign colon polyp   . PTSD (post-traumatic stress disorder)     possible [place] vet  . Hypertension   . Personal history of colonic polyps 12/30/2013    Per colonoscopy at [facility] per pt, about sept 2014  . Depression 12/30/2013   No past surgical history on file.  reports that he has never smoked. He does not have any smokeless tobacco history on file. He reports that he does not drink alcohol or use illicit drugs. family history includes Hypertension in an other family member. Allergies  Allergen Reactions  . Lisinopril     Angioedema? Patient reports that his tongue and mouth became very swollen, was seen at [facility] for this in 2014.    Current Outpatient Prescriptions on File Prior to Visit  Medication Sig Dispense Refill  . aspirin EC 81 MG tablet Take 1 tablet (81 mg total) by mouth daily. 90 tablet 11   No current facility-administered medications on file prior to visit.   Review of Systems Constitutional:  Negative for increased diaphoresis, other activity, appetite or siginficant weight change other than noted HENT: Negative for worsening hearing loss, ear pain, facial swelling, mouth sores and neck stiffness.   Eyes: Negative for other worsening pain, redness or visual disturbance.  Respiratory: Negative for shortness of breath and wheezing  Cardiovascular: Negative for chest pain and palpitations.  Gastrointestinal: Negative for diarrhea, blood in stool, abdominal distention or other pain Genitourinary: Negative for hematuria, flank pain or change in urine volume.  Musculoskeletal: Negative for myalgias or other joint complaints.  Skin: Negative for color change and wound or drainage.  Neurological: Negative for syncope and numbness. other than noted Hematological: Negative for adenopathy. or other swelling Psychiatric/Behavioral: Negative for hallucinations, SI, self-injury, decreased concentration or other worsening agitation.      Objective:   Physical Exam BP 126/86 mmHg  Pulse 67  Temp(Src) 97.8 F (36.6 C) (Oral)  Ht 5\' 7"  (1.702 m)  Wt 219 lb (99.338 kg)  BMI 34.29 kg/m2  SpO2 98% VS noted,  Constitutional: Pt is oriented to person, place, and time. Appears well-developed and well-nourished, in no significant distress Head: Normocephalic and atraumatic.  Right Ear: External ear normal.  Left Ear: External ear normal.  Nose: Nose normal.  Mouth/Throat: Oropharynx is clear and moist.  Eyes: Conjunctivae and EOM are normal. Pupils are equal, round, and reactive to light.  Neck: Normal range of motion. Neck supple. No JVD present. No tracheal deviation present or significant neck LA or mass Cardiovascular: Normal rate,  regular rhythm, normal heart sounds and intact distal pulses.   Pulmonary/Chest: Effort normal and breath sounds without rales or wheezing  Abdominal: Soft. Bowel sounds are normal. NT. No HSM  Musculoskeletal: Normal range of motion. Exhibits no edema.    Lymphadenopathy:  Has no cervical adenopathy.  Neurological: Pt is alert and oriented to person, place, and time. Pt has normal reflexes. No cranial nerve deficit. Motor grossly intact Skin: Skin is warm and dry. No rash noted.  Psychiatric:  Has normal mood and affect. Behavior is normal.     Assessment & Plan:

## 2015-02-09 ENCOUNTER — Encounter: Payer: Self-pay | Admitting: Internal Medicine

## 2015-09-01 ENCOUNTER — Other Ambulatory Visit (INDEPENDENT_AMBULATORY_CARE_PROVIDER_SITE_OTHER): Payer: BC Managed Care – PPO

## 2015-09-01 ENCOUNTER — Ambulatory Visit (INDEPENDENT_AMBULATORY_CARE_PROVIDER_SITE_OTHER): Payer: BC Managed Care – PPO | Admitting: Internal Medicine

## 2015-09-01 DIAGNOSIS — R6889 Other general symptoms and signs: Secondary | ICD-10-CM

## 2015-09-01 DIAGNOSIS — I10 Essential (primary) hypertension: Secondary | ICD-10-CM

## 2015-09-01 DIAGNOSIS — Z0001 Encounter for general adult medical examination with abnormal findings: Secondary | ICD-10-CM | POA: Diagnosis not present

## 2015-09-01 DIAGNOSIS — F329 Major depressive disorder, single episode, unspecified: Secondary | ICD-10-CM

## 2015-09-01 DIAGNOSIS — R441 Visual hallucinations: Secondary | ICD-10-CM | POA: Insufficient documentation

## 2015-09-01 DIAGNOSIS — R4689 Other symptoms and signs involving appearance and behavior: Secondary | ICD-10-CM

## 2015-09-01 DIAGNOSIS — F32A Depression, unspecified: Secondary | ICD-10-CM

## 2015-09-01 DIAGNOSIS — F29 Unspecified psychosis not due to a substance or known physiological condition: Secondary | ICD-10-CM | POA: Diagnosis not present

## 2015-09-01 LAB — CBC WITH DIFFERENTIAL/PLATELET
BASOS ABS: 0 10*3/uL (ref 0.0–0.1)
Basophils Relative: 0.6 % (ref 0.0–3.0)
EOS ABS: 0 10*3/uL (ref 0.0–0.7)
Eosinophils Relative: 0.8 % (ref 0.0–5.0)
HEMATOCRIT: 40 % (ref 39.0–52.0)
Hemoglobin: 13.4 g/dL (ref 13.0–17.0)
LYMPHS ABS: 1.3 10*3/uL (ref 0.7–4.0)
LYMPHS PCT: 31.7 % (ref 12.0–46.0)
MCHC: 33.4 g/dL (ref 30.0–36.0)
MCV: 86.4 fl (ref 78.0–100.0)
MONO ABS: 0.4 10*3/uL (ref 0.1–1.0)
Monocytes Relative: 9.1 % (ref 3.0–12.0)
NEUTROS ABS: 2.4 10*3/uL (ref 1.4–7.7)
NEUTROS PCT: 57.8 % (ref 43.0–77.0)
PLATELETS: 138 10*3/uL — AB (ref 150.0–400.0)
RBC: 4.63 Mil/uL (ref 4.22–5.81)
RDW: 14.5 % (ref 11.5–15.5)
WBC: 4.2 10*3/uL (ref 4.0–10.5)

## 2015-09-01 LAB — URINALYSIS, ROUTINE W REFLEX MICROSCOPIC
Bilirubin Urine: NEGATIVE
Hgb urine dipstick: NEGATIVE
Ketones, ur: NEGATIVE
Leukocytes, UA: NEGATIVE
Nitrite: NEGATIVE
PH: 5.5 (ref 5.0–8.0)
RBC / HPF: NONE SEEN (ref 0–?)
SPECIFIC GRAVITY, URINE: 1.02 (ref 1.000–1.030)
TOTAL PROTEIN, URINE-UPE24: NEGATIVE
UROBILINOGEN UA: 0.2 (ref 0.0–1.0)
Urine Glucose: NEGATIVE

## 2015-09-01 LAB — BASIC METABOLIC PANEL
BUN: 8 mg/dL (ref 6–23)
CHLORIDE: 103 meq/L (ref 96–112)
CO2: 30 meq/L (ref 19–32)
CREATININE: 1.38 mg/dL (ref 0.40–1.50)
Calcium: 9.4 mg/dL (ref 8.4–10.5)
GFR: 65.37 mL/min (ref >60.00)
Glucose, Bld: 93 mg/dL (ref 70–99)
POTASSIUM: 3.9 meq/L (ref 3.5–5.1)
Sodium: 139 mEq/L (ref 135–145)

## 2015-09-01 LAB — LIPID PANEL
Cholesterol: 164 mg/dL (ref 0–200)
HDL: 44.8 mg/dL (ref >39.00)
LDL Cholesterol: 104 mg/dL — ABNORMAL HIGH (ref 0–99)
NONHDL: 118.71
Total CHOL/HDL Ratio: 4
Triglycerides: 76 mg/dL (ref 0.0–149.0)
VLDL: 15.2 mg/dL (ref 0.0–40.0)

## 2015-09-01 LAB — HEPATIC FUNCTION PANEL
ALT: 33 U/L (ref 0–53)
AST: 30 U/L (ref 0–37)
Albumin: 4.4 g/dL (ref 3.5–5.2)
Alkaline Phosphatase: 68 U/L (ref 39–117)
BILIRUBIN DIRECT: 0.1 mg/dL (ref 0.0–0.3)
TOTAL PROTEIN: 7.2 g/dL (ref 6.0–8.3)
Total Bilirubin: 0.5 mg/dL (ref 0.2–1.2)

## 2015-09-01 LAB — TSH: TSH: 1.18 u[IU]/mL (ref 0.35–4.50)

## 2015-09-01 LAB — PSA: PSA: 0.73 ng/mL (ref 0.10–4.00)

## 2015-09-01 MED ORDER — ESCITALOPRAM OXALATE 10 MG PO TABS: 10.0000 mg | ORAL_TABLET | Freq: Every day | ORAL | Status: DC

## 2015-09-01 MED ORDER — NIFEDIPINE ER OSMOTIC RELEASE 30 MG PO TB24: 30.0000 mg | ORAL_TABLET | Freq: Every day | ORAL | Status: DC
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
## 2015-09-01 NOTE — Patient Instructions (Signed)
Please take all new medication as prescribed  - the lexapro 10 mg per day  Please continue all other medications as before, and refills have been done if requested.  Please have the pharmacy call with any other refills you may need.  Please continue your efforts at being more active, low cholesterol diet, and weight control.  You are otherwise up to date with prevention measures today.  Please keep your appointments with your specialists as you may have planned  Please go to the LAB in the Basement (turn left off the elevator) for the tests to be done today  You will be contacted by phone if any changes need to be made immediately.  Otherwise, you will receive a letter about your results with an explanation, but please check with MyChart first.  Please remember to sign up for MyChart if you have not done so, as this will be important to you in the future with finding out test results, communicating by private email, and scheduling acute appointments online when needed.  Please return in 6 months, or sooner if needed

## 2015-09-02 NOTE — Assessment & Plan Note (Signed)
Mod to severe, for start lexapro 10 qd, f/u with psychiatry next month as planned,  to f/u any worsening symptoms or concerns, verified no SI or HI today

## 2015-09-02 NOTE — Assessment & Plan Note (Signed)
Has been worsening as of late, I wonder but did not address concerns of paranoia and distrust, may be a factor in his not taking medications,  to f/u any worsening symptoms or concerns

## 2015-09-02 NOTE — Assessment & Plan Note (Addendum)
Overall doing well, age appropriate education and counseling updated, referrals for preventative services and immunizations addressed, dietary and smoking counseling addressed, most recent labs reviewed.  I have personally reviewed and have noted:  1) the patient's medical and social history 2) The pt's use of alcohol, tobacco, and illicit drugs 3) The patient's current medications and supplements 4) Functional ability including ADL's, fall risk, home safety risk, hearing and visual impairment 5) Diet and physical activities 6) Evidence for depression or mood disorder 7) The patient's height, weight, and BMI have been recorded in the chart  I have made referrals, and provided counseling and education based on review of the above Lab Results  Component Value Date   WBC 4.2 09/01/2015   HGB 13.4 09/01/2015   HCT 40.0 09/01/2015   PLT 138.0* 09/01/2015   GLUCOSE 93 09/01/2015   CHOL 164 09/01/2015   TRIG 76.0 09/01/2015   HDL 44.80 09/01/2015   LDLCALC 104* 09/01/2015   ALT 33 09/01/2015   AST 30 09/01/2015   NA 139 09/01/2015   K 3.9 09/01/2015   CL 103 09/01/2015   CREATININE 1.38 09/01/2015   BUN 8 09/01/2015   CO2 30 09/01/2015   TSH 1.18 09/01/2015   PSA 0.73 09/01/2015

## 2015-09-02 NOTE — Assessment & Plan Note (Addendum)
Declines other med for now, liekly related to depression, advised him and wife today to go to ER for any further symptoms, or at least present next day to [facility].  In addition to the time spent performing CPE, I spent an additional 40 minutes face to face,in which greater than 50% of this time was spent in counseling and coordination of care for patient's acute illness as documented.

## 2015-09-02 NOTE — Assessment & Plan Note (Signed)
stable overall by history and exam, recent data reviewed with pt, and pt to continue medical treatment as before,  to f/u any worsening symptoms or concerns BP Readings from Last 3 Encounters:  01/14/15 126/86  12/30/13 118/60  02/03/13 118/82

## 2015-09-03 ENCOUNTER — Encounter: Payer: Self-pay | Admitting: Internal Medicine

## 2015-11-10 ENCOUNTER — Ambulatory Visit: Payer: BC Managed Care – PPO

## 2015-11-10 VITALS — BP 138/88

## 2015-11-10 DIAGNOSIS — I10 Essential (primary) hypertension: Secondary | ICD-10-CM

## 2015-11-16 ENCOUNTER — Ambulatory Visit (INDEPENDENT_AMBULATORY_CARE_PROVIDER_SITE_OTHER): Payer: BC Managed Care – PPO | Admitting: Internal Medicine

## 2015-11-16 ENCOUNTER — Encounter: Payer: Self-pay | Admitting: Internal Medicine

## 2015-11-16 VITALS — BP 140/82 | HR 89 | Temp 97.8°F | Resp 20 | Wt 222.0 lb

## 2015-11-16 DIAGNOSIS — F329 Major depressive disorder, single episode, unspecified: Secondary | ICD-10-CM | POA: Diagnosis not present

## 2015-11-16 DIAGNOSIS — I10 Essential (primary) hypertension: Secondary | ICD-10-CM | POA: Diagnosis not present

## 2015-11-16 DIAGNOSIS — R6889 Other general symptoms and signs: Secondary | ICD-10-CM

## 2015-11-16 DIAGNOSIS — Z0001 Encounter for general adult medical examination with abnormal findings: Secondary | ICD-10-CM | POA: Diagnosis not present

## 2015-11-16 DIAGNOSIS — F32A Depression, unspecified: Secondary | ICD-10-CM

## 2015-11-16 DIAGNOSIS — F4310 Post-traumatic stress disorder, unspecified: Secondary | ICD-10-CM

## 2015-11-16 MED ORDER — LOSARTAN POTASSIUM 100 MG PO TABS: 100.0000 mg | ORAL_TABLET | Freq: Every day | ORAL | 3 refills | Status: DC

## 2015-11-16 NOTE — Assessment & Plan Note (Signed)
stable overall by history and exam, denies HI or SI, plans to follow up with psychiatry, and pt to continue medical treatment as before,  to f/u any worsening symptoms or concerns

## 2015-11-16 NOTE — Progress Notes (Signed)
Subjective:    Patient ID: [name], male    DOB: 11-Dec-1945, 70 y.o.   MRN: [id number]  HPI  Here to f/u; overall doing ok,  Pt denies chest pain, increasing sob or doe, wheezing, orthopnea, PND, increased LE swelling, palpitations, dizziness or syncope.  Pt denies new neurological symptoms such as new headache, or facial or extremity weakness or numbness.  Pt denies polydipsia, polyuria, or low sugar episode.   Pt denies new neurological symptoms such as new headache, or facial or extremity weakness or numbness.   Pt states overall good compliance with meds, mostly trying to follow appropriate diet, with wt overall stable,  but little exercise however. BP Readings from Last 3 Encounters:  11/16/15 140/82  11/10/15 138/88  01/14/15 126/86   Wt Readings from Last 3 Encounters:  11/16/15 222 lb (100.7 kg)  01/14/15 219 lb (99.3 kg)  12/30/13 217 lb (98.4 kg)  Not taking the lexapro, was unsure if he really wanted to start at last visit. Denies worsening depressive symptoms, suicidal ideation, or panic; has ongoing anxiety Past Medical History:  Diagnosis Date  . Benign colon polyp   . Depression 12/30/2013  . Hypertension   . Personal history of colonic polyps 12/30/2013   Per colonoscopy at [facility] per pt, about sept 2014  . PTSD (post-traumatic stress disorder)    possible [place] vet   No past surgical history on file.  reports that he has never smoked. He does not have any smokeless tobacco history on file. He reports that he does not drink alcohol or use drugs. family history is not on file. Allergies  Allergen Reactions  . Lisinopril     Angioedema? Patient reports that his tongue and mouth became very swollen, was seen at [facility] for this in 2014.    Current Outpatient Prescriptions on File Prior to Visit  Medication Sig Dispense Refill  . aspirin EC 81 MG tablet Take 1 tablet (81 mg total) by mouth daily. 90 tablet 11  . escitalopram (LEXAPRO) 10 MG tablet Take 1 tablet (10  mg total) by mouth daily. 90 tablet 3  . NIFEdipine (PROCARDIA XL) 30 MG 24 hr tablet Take 1 tablet (30 mg total) by mouth daily. 90 tablet 3   No current facility-administered medications on file prior to visit.     Review of Systems  Constitutional: Negative for unusual diaphoresis or night sweats HENT: Negative for ear swelling or discharge Eyes: Negative for worsening visual haziness  Respiratory: Negative for choking and stridor.   Gastrointestinal: Negative for distension or worsening eructation Genitourinary: Negative for retention or change in urine volume.  Musculoskeletal: Negative for other MSK pain or swelling Skin: Negative for color change and worsening wound Neurological: Negative for tremors and numbness other than noted  Psychiatric/Behavioral: Negative for decreased concentration or agitation other than above       Objective:   Physical Exam BP 140/82   Pulse 89   Temp 97.8 F (36.6 C) (Oral)   Resp 20   Wt 222 lb (100.7 kg)   SpO2 97%   BMI 34.77 kg/m  VS noted, not ill appearing Constitutional: Pt appears in no apparent distress HENT: Head: NCAT.  Right Ear: External ear normal.  Left Ear: External ear normal.  Eyes: . Pupils are equal, round, and reactive to light. Conjunctivae and EOM are normal Neck: Normal range of motion. Neck supple.  Cardiovascular: Normal rate and regular rhythm.   Pulmonary/Chest: Effort normal and breath sounds without rales  or wheezing.  Abd:  Soft, NT, ND, + BS Neurological: Pt is alert. Not confused , motor grossly intact Skin: Skin is warm. No rash, no LE edema Psychiatric: Pt behavior is normal. No agitation. mild dysphoric, not nervous appearing     Assessment & Plan:

## 2015-11-16 NOTE — Assessment & Plan Note (Signed)
stable overall by history and exam, recent data reviewed with pt, and pt to continue medical treatment as before,  to f/u any worsening symptoms or concerns Lab Results  Component Value Date   WBC 4.2 09/01/2015   HGB 13.4 09/01/2015   HCT 40.0 09/01/2015   PLT 138.0 (L) 09/01/2015   GLUCOSE 93 09/01/2015   CHOL 164 09/01/2015   TRIG 76.0 09/01/2015   HDL 44.80 09/01/2015   LDLCALC 104 (H) 09/01/2015   ALT 33 09/01/2015   AST 30 09/01/2015   NA 139 09/01/2015   K 3.9 09/01/2015   CL 103 09/01/2015   CREATININE 1.38 09/01/2015   BUN 8 09/01/2015   CO2 30 09/01/2015   TSH 1.18 09/01/2015   PSA 0.73 09/01/2015

## 2015-11-16 NOTE — Patient Instructions (Signed)
Please take all new medication as prescribed - the losartan at 100 mg per day  Please continue all other medications as before, including the nifedipine  Please have the pharmacy call with any other refills you may need.  Please continue your efforts at being more active, low cholesterol diet, and weight control.  Please keep your appointments with your specialists as you may have planned  Please return in 6 months, or sooner if needed, with Lab testing done 3-5 days before

## 2015-11-16 NOTE — Assessment & Plan Note (Signed)
Mild uncontrolled, to add losart 100 qd,  to f/u any worsening symptoms or concerns

## 2015-11-16 NOTE — Progress Notes (Signed)
Pre visit review using our clinic review tool, if applicable. No additional management support is needed unless otherwise documented below in the visit note. 

## 2016-10-13 ENCOUNTER — Other Ambulatory Visit: Payer: Self-pay | Admitting: Internal Medicine

## 2017-01-10 ENCOUNTER — Ambulatory Visit: Payer: BC Managed Care – PPO | Admitting: Internal Medicine

## 2017-01-11 ENCOUNTER — Telehealth: Payer: Self-pay | Admitting: Internal Medicine

## 2017-01-11 ENCOUNTER — Other Ambulatory Visit (INDEPENDENT_AMBULATORY_CARE_PROVIDER_SITE_OTHER): Payer: BC Managed Care – PPO

## 2017-01-11 ENCOUNTER — Ambulatory Visit (INDEPENDENT_AMBULATORY_CARE_PROVIDER_SITE_OTHER): Payer: BC Managed Care – PPO | Admitting: Internal Medicine

## 2017-01-11 ENCOUNTER — Encounter: Payer: Self-pay | Admitting: Internal Medicine

## 2017-01-11 VITALS — BP 162/104 | HR 68 | Temp 98.0°F | Ht 67.0 in | Wt 218.0 lb

## 2017-01-11 DIAGNOSIS — K5900 Constipation, unspecified: Secondary | ICD-10-CM | POA: Diagnosis not present

## 2017-01-11 DIAGNOSIS — F329 Major depressive disorder, single episode, unspecified: Secondary | ICD-10-CM | POA: Diagnosis not present

## 2017-01-11 DIAGNOSIS — Z1159 Encounter for screening for other viral diseases: Secondary | ICD-10-CM

## 2017-01-11 DIAGNOSIS — F32A Depression, unspecified: Secondary | ICD-10-CM

## 2017-01-11 DIAGNOSIS — Z0000 Encounter for general adult medical examination without abnormal findings: Secondary | ICD-10-CM

## 2017-01-11 DIAGNOSIS — I10 Essential (primary) hypertension: Secondary | ICD-10-CM

## 2017-01-11 DIAGNOSIS — Z0001 Encounter for general adult medical examination with abnormal findings: Secondary | ICD-10-CM

## 2017-01-11 LAB — URINALYSIS, ROUTINE W REFLEX MICROSCOPIC
Bilirubin Urine: NEGATIVE
HGB URINE DIPSTICK: NEGATIVE
Ketones, ur: NEGATIVE
LEUKOCYTES UA: NEGATIVE
Nitrite: NEGATIVE
RBC / HPF: NONE SEEN (ref 0–?)
Specific Gravity, Urine: 1.025 (ref 1.000–1.030)
TOTAL PROTEIN, URINE-UPE24: NEGATIVE
URINE GLUCOSE: NEGATIVE
Urobilinogen, UA: 0.2 (ref 0.0–1.0)
WBC, UA: NONE SEEN (ref 0–?)
pH: 6 (ref 5.0–8.0)

## 2017-01-11 LAB — CBC WITH DIFFERENTIAL/PLATELET
BASOS ABS: 0 10*3/uL (ref 0.0–0.1)
Basophils Relative: 0.8 % (ref 0.0–3.0)
EOS ABS: 0 10*3/uL (ref 0.0–0.7)
EOS PCT: 0.9 % (ref 0.0–5.0)
HCT: 40 % (ref 39.0–52.0)
Hemoglobin: 12.9 g/dL — ABNORMAL LOW (ref 13.0–17.0)
LYMPHS ABS: 1.4 10*3/uL (ref 0.7–4.0)
Lymphocytes Relative: 34.1 % (ref 12.0–46.0)
MCHC: 32.4 g/dL (ref 30.0–36.0)
MCV: 89.3 fl (ref 78.0–100.0)
MONO ABS: 0.4 10*3/uL (ref 0.1–1.0)
Monocytes Relative: 10.5 % (ref 3.0–12.0)
NEUTROS PCT: 53.7 % (ref 43.0–77.0)
Neutro Abs: 2.1 10*3/uL (ref 1.4–7.7)
Platelets: 136 10*3/uL — ABNORMAL LOW (ref 150.0–400.0)
RBC: 4.47 Mil/uL (ref 4.22–5.81)
RDW: 14 % (ref 11.5–15.5)
WBC: 4 10*3/uL (ref 4.0–10.5)

## 2017-01-11 LAB — LIPID PANEL
CHOLESTEROL: 137 mg/dL (ref 0–200)
HDL: 50.6 mg/dL (ref >39.00)
LDL CALC: 72 mg/dL (ref 0–99)
NonHDL: 85.98
Total CHOL/HDL Ratio: 3
Triglycerides: 71 mg/dL (ref 0.0–149.0)
VLDL: 14.2 mg/dL (ref 0.0–40.0)

## 2017-01-11 LAB — HEPATIC FUNCTION PANEL
ALBUMIN: 4.1 g/dL (ref 3.5–5.2)
ALK PHOS: 61 U/L (ref 39–117)
ALT: 19 U/L (ref 0–53)
AST: 21 U/L (ref 0–37)
Bilirubin, Direct: 0.1 mg/dL (ref 0.0–0.3)
TOTAL PROTEIN: 6.9 g/dL (ref 6.0–8.3)
Total Bilirubin: 0.6 mg/dL (ref 0.2–1.2)

## 2017-01-11 LAB — TSH: TSH: 1.47 u[IU]/mL (ref 0.35–4.50)

## 2017-01-11 LAB — PSA: PSA: 1.05 ng/mL (ref 0.10–4.00)

## 2017-01-11 LAB — BASIC METABOLIC PANEL
BUN: 12 mg/dL (ref 6–23)
CO2: 31 meq/L (ref 19–32)
Calcium: 9.4 mg/dL (ref 8.4–10.5)
Chloride: 104 mEq/L (ref 96–112)
Creatinine, Ser: 1.27 mg/dL (ref 0.40–1.50)
GFR: 71.67 mL/min (ref >60.00)
GLUCOSE: 79 mg/dL (ref 70–99)
Potassium: 4 mEq/L (ref 3.5–5.1)
SODIUM: 140 meq/L (ref 135–145)

## 2017-01-11 MED ORDER — LOSARTAN POTASSIUM 100 MG PO TABS: 100.0000 mg | ORAL_TABLET | Freq: Every day | ORAL | 3 refills | Status: DC

## 2017-01-11 MED ORDER — NIFEDIPINE ER OSMOTIC RELEASE 30 MG PO TB24: 30.0000 mg | ORAL_TABLET | Freq: Every day | ORAL | 3 refills | Status: DC

## 2017-01-11 MED ORDER — ESCITALOPRAM OXALATE 10 MG PO TABS: 10.0000 mg | ORAL_TABLET | Freq: Every day | ORAL | 3 refills | Status: DC

## 2017-01-11 MED ORDER — POLYETHYLENE GLYCOL 3350 17 GM/SCOOP PO POWD: 17.0000 g | Freq: Two times a day (BID) | ORAL | 1 refills | Status: DC | PRN

## 2017-01-11 NOTE — Progress Notes (Signed)
Subjective:    Patient ID: [name], male    DOB: [birth year], 71 y.o.   MRN: [id number]  HPI Here for wellness and f/u;  Overall doing ok;  Pt denies Chest pain, worsening SOB, DOE, wheezing, orthopnea, PND, worsening LE edema, palpitations, dizziness or syncope.  Pt denies neurological change such as new headache, facial or extremity weakness.  Pt denies polydipsia, polyuria, or low sugar symptoms. Pt states overall good compliance with treatment and medications, good tolerability, and has been trying to follow appropriate diet. No fever, night sweats, wt loss, loss of appetite, or other constitutional symptoms.  Pt states good ability with ADL's, has low fall risk, home safety reviewed and adequate, no other significant changes in hearing or vision, and only occasionally active with exercise.  Denies worsening reflux, abd pain, dysphagia, n/v, or blood but has had recurring constipation in the past few months. Is out of BP meds.  Wt Readings from Last 3 Encounters:  01/11/17 218 lb (98.9 kg)  11/16/15 222 lb (100.7 kg)  01/14/15 219 lb (99.3 kg)   BP Readings from Last 3 Encounters:  01/11/17 (!) 162/104  11/16/15 140/82  11/10/15 138/88  Does also c/o worsening depression symptoms in the past month, without SI or HI. Past Medical History:  Diagnosis Date  . Benign colon polyp   . Depression 12/30/2013  . Hypertension   . Personal history of colonic polyps 12/30/2013   Per colonoscopy at [facility] per pt, about sept 2014  . PTSD (post-traumatic stress disorder)    possible [place] vet   No past surgical history on file.  reports that he has never smoked. He has never used smokeless tobacco. He reports that he does not drink alcohol or use drugs. family history includes Hypertension in his unknown relative. Allergies  Allergen Reactions  . Lisinopril     Angioedema? Patient reports that his tongue and mouth became very swollen, was seen at [facility] for this in 2014.    Current Outpatient  Prescriptions on File Prior to Visit  Medication Sig Dispense Refill  . aspirin EC 81 MG tablet Take 1 tablet (81 mg total) by mouth daily. 90 tablet 11   No current facility-administered medications on file prior to visit.    Review of Systems Constitutional: Negative for other unusual diaphoresis, sweats, appetite or weight changes HENT: Negative for other worsening hearing loss, ear pain, facial swelling, mouth sores or neck stiffness.   Eyes: Negative for other worsening pain, redness or other visual disturbance.  Respiratory: Negative for other stridor or swelling Cardiovascular: Negative for other palpitations or other chest pain  Gastrointestinal: Negative for worsening diarrhea or loose stools, blood in stool, distention or other pain Genitourinary: Negative for hematuria, flank pain or other change in urine volume.  Musculoskeletal: Negative for myalgias or other joint swelling.  Skin: Negative for other color change, or other wound or worsening drainage.  Neurological: Negative for other syncope or numbness. Hematological: Negative for other adenopathy or swelling Psychiatric/Behavioral: Negative for hallucinations, other worsening agitation, SI, self-injury, or new decreased concentration No other exam findings    Objective:   Physical Exam BP (!) 162/104   Pulse 68   Temp 98 F (36.7 C) (Oral)   Ht 5\' 7"  (1.702 m)   Wt 218 lb (98.9 kg)   SpO2 100%   BMI 34.14 kg/m  VS noted,  Constitutional: Pt is oriented to person, place, and time. Appears well-developed and well-nourished, in no significant distress and comfortable Head:  Normocephalic and atraumatic  Eyes: Conjunctivae and EOM are normal. Pupils are equal, round, and reactive to light Right Ear: External ear normal without discharge Left Ear: External ear normal without discharge Nose: Nose without discharge or deformity Mouth/Throat: Oropharynx is without other ulcerations and moist  Neck: Normal range of  motion. Neck supple. No JVD present. No tracheal deviation present or significant neck LA or mass Cardiovascular: Normal rate, regular rhythm, normal heart sounds and intact distal pulses.   Pulmonary/Chest: WOB normal and breath sounds without rales or wheezing  Abdominal: Soft. Bowel sounds are normal. NT. No HSM  Musculoskeletal: Normal range of motion. Exhibits no edema Lymphadenopathy: Has no other cervical adenopathy.  Neurological: Pt is alert and oriented to person, place, and time. Pt has normal reflexes. No cranial nerve deficit. Motor grossly intact, Gait intact Skin: Skin is warm and dry. No rash noted or new ulcerations Psychiatric:  Has depressed mood and affect. Behavior is normal without agitation No other exam findings Lab Results  Component Value Date   WBC 4.0 01/11/2017   HGB 12.9 (L) 01/11/2017   HCT 40.0 01/11/2017   PLT 136.0 (L) 01/11/2017   GLUCOSE 79 01/11/2017   CHOL 137 01/11/2017   TRIG 71.0 01/11/2017   HDL 50.60 01/11/2017   LDLCALC 72 01/11/2017   ALT 19 01/11/2017   AST 21 01/11/2017   NA 140 01/11/2017   K 4.0 01/11/2017   CL 104 01/11/2017   CREATININE 1.27 01/11/2017   BUN 12 01/11/2017   CO2 31 01/11/2017   TSH 1.47 01/11/2017   PSA 1.05 01/11/2017      Assessment & Plan:

## 2017-01-11 NOTE — Telephone Encounter (Signed)
[place] for miralax asd - done erx

## 2017-01-11 NOTE — Telephone Encounter (Signed)
Called pt, received a message stating the phone number is not in service.

## 2017-01-11 NOTE — Patient Instructions (Signed)
Please take all new medication as prescribed - the lexapro 10 mg per day  Please continue all other medications as before, and refills have been done if requested, in cluding restarting the blood pressure medications  Please have the pharmacy call with any other refills you may need.  Please continue your efforts at being more active, low cholesterol diet, and weight control.  You are otherwise up to date with prevention measures today.  Please keep your appointments with your specialists as you may have planned  Please go to the LAB in the Basement (turn left off the elevator) for the tests to be done today  You will be contacted by phone if any changes need to be made immediately.  Otherwise, you will receive a letter about your results with an explanation, but please check with MyChart first.  Please remember to sign up for MyChart if you have not done so, as this will be important to you in the future with finding out test results, communicating by private email, and scheduling acute appointments online when needed.  Please return in 6 months, or sooner if needed

## 2017-01-11 NOTE — Telephone Encounter (Signed)
Pt stopped by front desk after his appt today and was wondering if Dr Jenny Reichmann could prescribe/recommend something for constipation.

## 2017-01-12 LAB — HEPATITIS C ANTIBODY
Hepatitis C Ab: NONREACTIVE
SIGNAL TO CUT-OFF: 0.14 (ref <1.00)

## 2017-01-13 NOTE — Assessment & Plan Note (Signed)
To re-start meds

## 2017-01-13 NOTE — Assessment & Plan Note (Signed)

## 2017-01-13 NOTE — Assessment & Plan Note (Signed)
Ok for miralax asd,  to f/u any worsening symptoms or concerns 

## 2017-01-13 NOTE — Assessment & Plan Note (Signed)
Mild to mod, for lexapro 10 qd, declines counseling referral or psychiatry

## 2017-07-11 ENCOUNTER — Ambulatory Visit: Payer: BC Managed Care – PPO | Admitting: Internal Medicine

## 2017-07-11 ENCOUNTER — Encounter: Payer: Self-pay | Admitting: Internal Medicine

## 2017-07-11 ENCOUNTER — Other Ambulatory Visit: Payer: Self-pay | Admitting: Internal Medicine

## 2017-07-11 VITALS — BP 124/68 | HR 65 | Temp 97.6°F | Ht 67.0 in | Wt 211.0 lb

## 2017-07-11 DIAGNOSIS — F329 Major depressive disorder, single episode, unspecified: Secondary | ICD-10-CM | POA: Diagnosis not present

## 2017-07-11 DIAGNOSIS — F4310 Post-traumatic stress disorder, unspecified: Secondary | ICD-10-CM

## 2017-07-11 DIAGNOSIS — I10 Essential (primary) hypertension: Secondary | ICD-10-CM

## 2017-07-11 DIAGNOSIS — N529 Male erectile dysfunction, unspecified: Secondary | ICD-10-CM | POA: Diagnosis not present

## 2017-07-11 DIAGNOSIS — F32A Depression, unspecified: Secondary | ICD-10-CM

## 2017-07-11 DIAGNOSIS — Z0000 Encounter for general adult medical examination without abnormal findings: Secondary | ICD-10-CM

## 2017-07-11 MED ORDER — ESCITALOPRAM OXALATE 10 MG PO TABS: 10.0000 mg | ORAL_TABLET | Freq: Every day | ORAL | 3 refills | Status: DC

## 2017-07-11 MED ORDER — SILDENAFIL CITRATE 100 MG PO TABS: 50.0000 mg | ORAL_TABLET | Freq: Every day | ORAL | 11 refills | Status: DC | PRN

## 2017-07-11 MED ORDER — LOSARTAN POTASSIUM 100 MG PO TABS: 100.0000 mg | ORAL_TABLET | Freq: Every day | ORAL | 3 refills | Status: DC

## 2017-07-11 MED ORDER — NIFEDIPINE ER OSMOTIC RELEASE 30 MG PO TB24: 30.0000 mg | ORAL_TABLET | Freq: Every day | ORAL | 3 refills | Status: DC

## 2017-07-11 NOTE — Progress Notes (Signed)
Subjective:    Patient ID: [name], male    DOB: 1945-07-10, 72 y.o.   MRN: [id number]  HPI Here to f/u; overall doing ok,  Pt denies chest pain, increasing sob or doe, wheezing, orthopnea, PND, increased LE swelling, palpitations, dizziness or syncope.  Pt denies new neurological symptoms such as new headache, or facial or extremity weakness or numbness.  Pt denies polydipsia, polyuria, or low sugar episode.  Pt states overall good compliance with meds, mostly trying to follow appropriate diet, with wt overall stable,  but little exercise however.  Still working at [employer] . Declines immunizations.  Denies worsening depressive symptoms, suicidal ideation, or panic; has ongoing anxiety, not increased recently.  No other interval change or complaints except also worsening ED symptoms of last 6 months Past Medical History:  Diagnosis Date  . Benign colon polyp   . Depression 12/30/2013  . Hypertension   . Personal history of colonic polyps 12/30/2013   Per colonoscopy at [facility] per pt, about sept 2014  . PTSD (post-traumatic stress disorder)    possible [place] vet   History reviewed. No pertinent surgical history.  reports that he has never smoked. He has never used smokeless tobacco. He reports that he does not drink alcohol or use drugs. family history includes Hypertension in his unknown relative. Allergies  Allergen Reactions  . Lisinopril     Angioedema? Patient reports that his tongue and mouth became very swollen, was seen at [facility] for this in 2014.    No current outpatient medications on file prior to visit.   No current facility-administered medications on file prior to visit.    Review of Systems  Constitutional: Negative for other unusual diaphoresis or sweats HENT: Negative for ear discharge or swelling Eyes: Negative for other worsening visual disturbances Respiratory: Negative for stridor or other swelling  Gastrointestinal: Negative for worsening distension or other  blood Genitourinary: Negative for retention or other urinary change Musculoskeletal: Negative for other MSK pain or swelling Skin: Negative for color change or other new lesions Neurological: Negative for worsening tremors and other numbness  Psychiatric/Behavioral: Negative for worsening agitation or other fatigue All other system neg per pt    Objective:   Physical Exam BP 124/68 (BP Location: Left Arm, Patient Position: Sitting, Cuff Size: Normal)   Pulse 65   Temp 97.6 F (36.4 C) (Oral)   Ht 5\' 7"  (1.702 m)   Wt 211 lb (95.7 kg)   SpO2 97%   BMI 33.05 kg/m  VS noted,  Constitutional: Pt appears in NAD HENT: Head: NCAT.  Right Ear: External ear normal.  Left Ear: External ear normal.  Eyes: . Pupils are equal, round, and reactive to light. Conjunctivae and EOM are normal Nose: without d/c or deformity Neck: Neck supple. Gross normal ROM Cardiovascular: Normal rate and regular rhythm.   Pulmonary/Chest: Effort normal and breath sounds without rales or wheezing.  Abd:  Soft, NT, ND, + BS, no organomegaly Neurological: Pt is alert. At baseline orientation, motor grossly intact Skin: Skin is warm. No rashes, other new lesions, no LE edema Psychiatric: Pt behavior is normal without agitation , mild depressed/anxious affect Lab Results  Component Value Date   WBC 4.0 01/11/2017   HGB 12.9 (L) 01/11/2017   HCT 40.0 01/11/2017   PLT 136.0 (L) 01/11/2017   GLUCOSE 79 01/11/2017   CHOL 137 01/11/2017   TRIG 71.0 01/11/2017   HDL 50.60 01/11/2017   LDLCALC 72 01/11/2017   ALT 19 01/11/2017  AST 21 01/11/2017   NA 140 01/11/2017   K 4.0 01/11/2017   CL 104 01/11/2017   CREATININE 1.27 01/11/2017   BUN 12 01/11/2017   CO2 31 01/11/2017   TSH 1.47 01/11/2017   PSA 1.05 01/11/2017      Assessment & Plan:

## 2017-07-11 NOTE — Patient Instructions (Signed)
Please take all new medication as prescribed - the viagra as needed (in hardcopy)  Please continue all other medications as before, and refills have been done if requested.  Please have the pharmacy call with any other refills you may need.  Please continue your efforts at being more active, low cholesterol diet, and weight control.  Please keep your appointments with your specialists as you may have planned  Please return in 6 months, or sooner if needed, with Lab testing done 3-5 days before

## 2017-07-11 NOTE — Telephone Encounter (Signed)
Copied from [place] [id number]. Topic: Quick Communication - Rx Refill/Question >> Jul 11, 2017  5:33 PM Cecelia Byars, NT wrote: Medication:  sildenafil (VIAGRA) 100 MG tablet  Has the patient contacted their pharmacy? {yes  (Agent: If no, request that the patient contact the pharmacy for the refill.) Preferred Pharmacy (with phone number or street name):[facility] [phone] - [place], [place] - [address] [phone] (Phone) [phone] (Fax Agent: Please be advised that RX refills may take up to 3 business days. We ask that you follow-up with your pharmacy.

## 2017-07-12 ENCOUNTER — Encounter: Payer: Self-pay | Admitting: Internal Medicine

## 2017-07-12 NOTE — Assessment & Plan Note (Signed)
stable overall by history and exam, recent data reviewed with pt, and pt to continue medical treatment as before,  to f/u any worsening symptoms or concerns BP Readings from Last 3 Encounters:  07/11/17 124/68  01/11/17 (!) 162/104  11/16/15 140/82

## 2017-07-12 NOTE — Assessment & Plan Note (Signed)
stable overall by history and exam, and pt to continue medical treatment as before,  to f/u any worsening symptoms or concerns 

## 2017-07-12 NOTE — Telephone Encounter (Signed)
Per chart MD assistant already sent refill this am../lmb

## 2017-07-12 NOTE — Assessment & Plan Note (Signed)
Ok for viagra prn,  to f/u any worsening symptoms or concerns  

## 2017-07-27 NOTE — Telephone Encounter (Signed)
Pt states he needs an new Rx for sildenafil (VIAGRA) 100 MG tablet [[id number]].  [facility] [place], [place] AT [address] [phone] (Phone) [phone] (Fax)

## 2017-07-27 NOTE — Telephone Encounter (Signed)
Also pt states he lost Rx.

## 2017-07-30 ENCOUNTER — Other Ambulatory Visit: Payer: Self-pay | Admitting: Internal Medicine

## 2017-07-30 MED ORDER — SILDENAFIL CITRATE 100 MG PO TABS: 50.0000 mg | ORAL_TABLET | Freq: Every day | ORAL | 11 refills | Status: DC | PRN

## 2017-07-30 MED ORDER — TADALAFIL 20 MG PO TABS: 20.0000 mg | ORAL_TABLET | Freq: Every day | ORAL | 11 refills | Status: DC | PRN

## 2017-07-30 NOTE — Addendum Note (Signed)
Addended by: Biagio Borg on: 07/30/2017 12:24 PM   Modules accepted: Orders

## 2017-07-30 NOTE — Telephone Encounter (Signed)
Done erx 

## 2018-01-05 ENCOUNTER — Other Ambulatory Visit: Payer: Self-pay

## 2018-01-05 ENCOUNTER — Encounter (HOSPITAL_COMMUNITY): Payer: Self-pay | Admitting: Emergency Medicine

## 2018-01-05 ENCOUNTER — Emergency Department (HOSPITAL_COMMUNITY)
Admission: EM | Admit: 2018-01-05 | Discharge: 2018-01-05 | Disposition: A | Discharge status: Home / Self Care | Payer: BC Managed Care – PPO | Attending: Emergency Medicine | Admitting: Emergency Medicine

## 2018-01-05 DIAGNOSIS — I10 Essential (primary) hypertension: Secondary | ICD-10-CM | POA: Diagnosis not present

## 2018-01-05 DIAGNOSIS — E871 Hypo-osmolality and hyponatremia: Secondary | ICD-10-CM | POA: Diagnosis not present

## 2018-01-05 DIAGNOSIS — Z79899 Other long term (current) drug therapy: Secondary | ICD-10-CM | POA: Diagnosis not present

## 2018-01-05 DIAGNOSIS — E860 Dehydration: Secondary | ICD-10-CM | POA: Diagnosis not present

## 2018-01-05 DIAGNOSIS — R42 Dizziness and giddiness: Secondary | ICD-10-CM | POA: Diagnosis present

## 2018-01-05 LAB — URINALYSIS, ROUTINE W REFLEX MICROSCOPIC
Bilirubin Urine: NEGATIVE
Glucose, UA: NEGATIVE mg/dL
Hgb urine dipstick: NEGATIVE
KETONES UR: NEGATIVE mg/dL
LEUKOCYTES UA: NEGATIVE
NITRITE: NEGATIVE
PH: 6 (ref 5.0–8.0)
Protein, ur: NEGATIVE mg/dL

## 2018-01-05 LAB — BASIC METABOLIC PANEL
Anion gap: 10 (ref 5–15)
BUN: 5 mg/dL — AB (ref 8–23)
CHLORIDE: 97 mmol/L — AB (ref 98–111)
CO2: 26 mmol/L (ref 22–32)
Calcium: 9.3 mg/dL (ref 8.9–10.3)
Creatinine, Ser: 1.46 mg/dL — ABNORMAL HIGH (ref 0.61–1.24)
GFR, EST AFRICAN AMERICAN: 53 mL/min — AB (ref >60)
GFR, EST NON AFRICAN AMERICAN: 46 mL/min — AB (ref >60)
GLUCOSE: 90 mg/dL (ref 70–99)
Potassium: 4.5 mmol/L (ref 3.5–5.1)
Sodium: 133 mmol/L — ABNORMAL LOW (ref 135–145)

## 2018-01-05 LAB — CBG MONITORING, ED: Glucose-Capillary: 78 mg/dL (ref 70–99)

## 2018-01-05 LAB — CBC
HEMATOCRIT: 43.4 % (ref 39.0–52.0)
HEMOGLOBIN: 14 g/dL (ref 13.0–17.0)
MCH: 28.3 pg (ref 26.0–34.0)
MCHC: 32.3 g/dL (ref 30.0–36.0)
MCV: 87.9 fL (ref 78.0–100.0)
Platelets: 121 10*3/uL — ABNORMAL LOW (ref 150–400)
RBC: 4.94 MIL/uL (ref 4.22–5.81)
RDW: 12.8 % (ref 11.5–15.5)
WBC: 3.1 10*3/uL — AB (ref 4.0–10.5)

## 2018-01-05 MED ORDER — SODIUM CHLORIDE 0.9 % IV BOLUS
1000.0000 mL | Freq: Once | INTRAVENOUS | Status: AC
  Administered 2018-01-05: 1000 mL via INTRAVENOUS

## 2018-01-05 NOTE — ED Triage Notes (Signed)
No LOC 

## 2018-01-05 NOTE — ED Notes (Signed)
ED Provider at bedside. 

## 2018-01-05 NOTE — ED Triage Notes (Signed)
Pt. Stated, I had episodes of block out when I went to bathroom. I took and enema and I think AI took out too much fluids.

## 2018-01-05 NOTE — ED Provider Notes (Signed)
[facility] Provider Note   CSN: [id number] Arrival date & time: 01/05/18  5188     History   Chief Complaint Chief Complaint  Patient presents with  . Dizziness  . Weakness    HPI [name] is a 72 y.o. male.  HPI   72 year old male here with dizziness and weakness.  The patient states he was in his usual state of health until yesterday.  He has a history of chronic constipation and reportedly gives himself free water enemas every 2 to 3 days.  He states that yesterday, he felt very backed up so he did to free water enemas.  He had a large amount of loose bowel movements.  Upon awakening and standing up this morning, he reports significant dizziness upon standing.  He states that this improved when he kneeled down and sat down.  He did not actually lose consciousness.  He had no associated chest pain, palpitations, or other symptoms.  He now feels somewhat better after drinking glasses of water and reportedly eating a salty meal.  He strongly denies any associated abdominal pain.  No fevers.  No chills.  No other recent medication changes.  No other medical complaints.  Past Medical History:  Diagnosis Date  . Benign colon polyp   . Depression 12/30/2013  . Hypertension   . Personal history of colonic polyps 12/30/2013   Per colonoscopy at [facility] per pt, about sept 2014  . PTSD (post-traumatic stress disorder)    possible [place] vet    Patient Active Problem List   Diagnosis Date Noted  . Erectile dysfunction 07/11/2017  . Constipation 01/11/2017  . 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 Non-compliant behavior 09/15/2014  . History of colonic polyps 12/30/2013  . Preventative health care 12/30/2013  . Depression 12/30/2013  . PTSD (post-traumatic stress disorder) 02/03/2013  . Essential hypertension 02/17/2010    History reviewed. No pertinent surgical history.      Home Medications    Prior to Admission medications   Medication  Sig Start Date End Date Taking? Authorizing Provider  escitalopram (LEXAPRO) 10 MG tablet Take 1 tablet (10 mg total) by mouth daily. 07/11/17 10/09/17  Biagio Borg, MD  losartan (COZAAR) 100 MG tablet Take 1 tablet (100 mg total) by mouth daily. 07/11/17   Biagio Borg, MD  NIFEdipine (PROCARDIA-XL/ADALAT-CC/NIFEDICAL-XL) 30 MG 24 hr tablet Take 1 tablet (30 mg total) by mouth daily. 07/11/17   Biagio Borg, MD  sildenafil (VIAGRA) 100 MG tablet Take 0.5-1 tablets (50-100 mg total) by mouth daily as needed for erectile dysfunction. 07/30/17   Biagio Borg, MD  tadalafil (CIALIS) 20 MG tablet Take 1 tablet (20 mg total) by mouth daily as needed for erectile dysfunction. 07/30/17   Biagio Borg, MD    Family History Family History  Problem Relation Age of Onset  . Hypertension Unknown     Social History Social History   Tobacco Use  . Smoking status: Never Smoker  . Smokeless tobacco: Never Used  Substance Use Topics  . Alcohol use: No    Alcohol/week: 0.0 standard drinks  . Drug use: No     Allergies   Lisinopril   Review of Systems Review of Systems  Constitutional: Positive for fatigue. Negative for chills and fever.  HENT: Negative for congestion and rhinorrhea.   Eyes: Negative for visual disturbance.  Respiratory: Negative for cough, shortness of breath and wheezing.   Cardiovascular: Negative for chest pain and leg  swelling.  Gastrointestinal: Positive for constipation. Negative for abdominal pain, diarrhea, nausea and vomiting.  Genitourinary: Negative for dysuria and flank pain.  Musculoskeletal: Negative for neck pain and neck stiffness.  Skin: Negative for rash and wound.  Allergic/Immunologic: Negative for immunocompromised state.  Neurological: Positive for light-headedness (Upon standing). Negative for syncope, weakness and headaches.  All other systems reviewed and are negative.    Physical Exam Updated Vital Signs BP 91/64 (BP Location: Right Arm)    Pulse 72   Temp 97.9 F (36.6 C) (Oral)   Resp 18   Ht 5\' 7"  (1.702 m)   Wt 99.8 kg   SpO2 100%   BMI 34.46 kg/m   Physical Exam  Constitutional: He is oriented to person, place, and time. He appears well-developed and well-nourished. No distress.  HENT:  Head: Normocephalic and atraumatic.  Mildly dry mucous membranes  Eyes: Conjunctivae are normal.  Neck: Neck supple.  Cardiovascular: Normal rate, regular rhythm and normal heart sounds. Exam reveals no friction rub.  No murmur heard. Pulmonary/Chest: Effort normal and breath sounds normal. No respiratory distress. He has no wheezes. He has no rales.  Abdominal: Soft. Bowel sounds are normal. He exhibits no distension. There is no tenderness. There is no rebound and no guarding.  Musculoskeletal: He exhibits no edema.  Neurological: He is alert and oriented to person, place, and time. He exhibits normal muscle tone.  Skin: Skin is warm. Capillary refill takes less than 2 seconds.  Psychiatric: He has a normal mood and affect.  Nursing note and vitals reviewed.    ED Treatments / Results  Labs (all labs ordered are listed, but only abnormal results are displayed) Labs Reviewed  BASIC METABOLIC PANEL - Abnormal; Notable for the following components:      Result Value   Sodium 133 (*)    Chloride 97 (*)    BUN 5 (*)    Creatinine, Ser 1.46 (*)    GFR calc non Af Amer 46 (*)    GFR calc Af Amer 53 (*)    All other components within normal limits  CBC - Abnormal; Notable for the following components:   WBC 3.1 (*)    Platelets 121 (*)    All other components within normal limits  URINALYSIS, ROUTINE W REFLEX MICROSCOPIC - Abnormal; Notable for the following components:   Color, Urine YELLOW (*)    APPearance CLEAR (*)    Specific Gravity, Urine <1.005 (*)    All other components within normal limits  CBG MONITORING, ED    EKG EKG Interpretation  Date/Time:  Saturday January 05 2018 10:20:47 EDT Ventricular  Rate:  68 PR Interval:  166 QRS Duration: 84 QT Interval:  394 QTC Calculation: 418 R Axis:   1 Text Interpretation:  Normal sinus rhythm Nonspecific T wave abnormality Abnormal ECG No old tracing to compare Confirmed by Duffy Bruce [phone]) on 01/05/2018 4:18:09 PM   Radiology No results found.  Procedures Procedures (including critical care time)  Medications Ordered in ED Medications  sodium chloride 0.9 % bolus 1,000 mL (0 mLs Intravenous Stopped 01/05/18 1412)     Initial Impression / Assessment and Plan / ED Course  I have reviewed the triage vital signs and the nursing notes.  Pertinent labs & imaging results that were available during my care of the patient were reviewed by me and considered in my medical decision making (see chart for details).     Very pleasant 72 year old male here with transient lightheadedness  upon standing.  Symptoms are reproduced upon standing here, resolved upon lying flat.  I suspect this is secondary to orthostasis in the setting of multiple free water enemas followed by significant amount of watery diarrhea.  His lab work does show mild hyponatremia which I suspect is hypovolemic.  He may also have a component of hyponatremia related to his free water enema.  I discussed that this is dangerous that his current frequency and will have him decrease the frequency and ideally stop this.  Advised him to hydrate with electrolyte replacement drinks.  He was given a liter of normal saline and has been ambulatory without difficulty or lightheadedness here.  He is otherwise well-appearing.  EKG nonischemic.  He has no arrhythmia on telemetry.  Will discharge home.  Final Clinical Impressions(s) / ED Diagnoses   Final diagnoses:  Dehydration  Hyponatremia    ED Discharge Orders    None       Duffy Bruce, MD 01/05/18 [phone]

## 2018-01-05 NOTE — Discharge Instructions (Addendum)
As we discussed, I suspect your symptoms are due to dehydration and low sodium related to your enemas.  I would limit your enemas to only 1, instead of 2, at a time.  Try to limit this to only as needed.  Following your enemas, it is important to drink at least 1 to 2 glasses of fluid.  You should drink at least 6-8 daily.  Following your enema, I recommend using Gatorade, Powerade, ideally the low sugar variations (G2, powerade zero), to rehydrate.  Be careful when going from sitting to standing.  I would follow-up with your doctor in 1 week for repeat lab work.

## 2018-01-05 NOTE — ED Notes (Signed)
Patient able to ambulate independently  

## 2018-01-11 ENCOUNTER — Encounter: Payer: Self-pay | Admitting: Internal Medicine

## 2018-01-11 ENCOUNTER — Ambulatory Visit: Payer: BC Managed Care – PPO | Admitting: Internal Medicine

## 2018-01-11 ENCOUNTER — Other Ambulatory Visit (INDEPENDENT_AMBULATORY_CARE_PROVIDER_SITE_OTHER): Payer: BC Managed Care – PPO

## 2018-01-11 VITALS — BP 112/74 | HR 62 | Temp 98.4°F | Resp 16 | Ht 67.0 in | Wt 199.1 lb

## 2018-01-11 DIAGNOSIS — I10 Essential (primary) hypertension: Secondary | ICD-10-CM

## 2018-01-11 DIAGNOSIS — K5900 Constipation, unspecified: Secondary | ICD-10-CM

## 2018-01-11 DIAGNOSIS — Z0000 Encounter for general adult medical examination without abnormal findings: Secondary | ICD-10-CM

## 2018-01-11 LAB — CBC WITH DIFFERENTIAL/PLATELET
BASOS ABS: 0 10*3/uL (ref 0.0–0.1)
Basophils Relative: 1.2 % (ref 0.0–3.0)
EOS ABS: 0 10*3/uL (ref 0.0–0.7)
Eosinophils Relative: 0.6 % (ref 0.0–5.0)
HCT: 38 % — ABNORMAL LOW (ref 39.0–52.0)
Hemoglobin: 12.7 g/dL — ABNORMAL LOW (ref 13.0–17.0)
Lymphocytes Relative: 34.9 % (ref 12.0–46.0)
Lymphs Abs: 1.2 10*3/uL (ref 0.7–4.0)
MCHC: 33.3 g/dL (ref 30.0–36.0)
MCV: 87 fl (ref 78.0–100.0)
MONO ABS: 0.4 10*3/uL (ref 0.1–1.0)
Monocytes Relative: 11.3 % (ref 3.0–12.0)
Neutro Abs: 1.8 10*3/uL (ref 1.4–7.7)
Neutrophils Relative %: 52 % (ref 43.0–77.0)
Platelets: 126 10*3/uL — ABNORMAL LOW (ref 150.0–400.0)
RBC: 4.37 Mil/uL (ref 4.22–5.81)
RDW: 13.7 % (ref 11.5–15.5)
WBC: 3.5 10*3/uL — AB (ref 4.0–10.5)

## 2018-01-11 LAB — BASIC METABOLIC PANEL
BUN: 10 mg/dL (ref 6–23)
CALCIUM: 9.4 mg/dL (ref 8.4–10.5)
CO2: 32 meq/L (ref 19–32)
Chloride: 103 mEq/L (ref 96–112)
Creatinine, Ser: 1.37 mg/dL (ref 0.40–1.50)
GFR: 65.49 mL/min (ref >60.00)
GLUCOSE: 93 mg/dL (ref 70–99)
Potassium: 4.4 mEq/L (ref 3.5–5.1)
Sodium: 139 mEq/L (ref 135–145)

## 2018-01-11 LAB — URINALYSIS, ROUTINE W REFLEX MICROSCOPIC
Bilirubin Urine: NEGATIVE
Hgb urine dipstick: NEGATIVE
Ketones, ur: NEGATIVE
Leukocytes, UA: NEGATIVE
Nitrite: NEGATIVE
PH: 6 (ref 5.0–8.0)
RBC / HPF: NONE SEEN (ref 0–?)
SPECIFIC GRAVITY, URINE: 1.01 (ref 1.000–1.030)
Total Protein, Urine: NEGATIVE
UROBILINOGEN UA: 0.2 (ref 0.0–1.0)
Urine Glucose: NEGATIVE
WBC, UA: NONE SEEN (ref 0–?)

## 2018-01-11 LAB — LIPID PANEL
CHOL/HDL RATIO: 3
Cholesterol: 125 mg/dL (ref 0–200)
HDL: 43.9 mg/dL (ref >39.00)
LDL Cholesterol: 69 mg/dL (ref 0–99)
NONHDL: 80.69
Triglycerides: 58 mg/dL (ref 0.0–149.0)
VLDL: 11.6 mg/dL (ref 0.0–40.0)

## 2018-01-11 LAB — HEPATIC FUNCTION PANEL
ALK PHOS: 59 U/L (ref 39–117)
ALT: 17 U/L (ref 0–53)
AST: 17 U/L (ref 0–37)
Albumin: 4.2 g/dL (ref 3.5–5.2)
BILIRUBIN DIRECT: 0.2 mg/dL (ref 0.0–0.3)
BILIRUBIN TOTAL: 0.7 mg/dL (ref 0.2–1.2)
Total Protein: 7 g/dL (ref 6.0–8.3)

## 2018-01-11 LAB — PSA: PSA: 0.86 ng/mL (ref 0.10–4.00)

## 2018-01-11 LAB — TSH: TSH: 0.53 u[IU]/mL (ref 0.35–4.50)

## 2018-01-11 MED ORDER — NIFEDIPINE ER OSMOTIC RELEASE 30 MG PO TB24: 30.0000 mg | ORAL_TABLET | Freq: Every day | ORAL | 3 refills | Status: DC

## 2018-01-11 MED ORDER — ESCITALOPRAM OXALATE 10 MG PO TABS: 10.0000 mg | ORAL_TABLET | Freq: Every day | ORAL | 3 refills | Status: DC

## 2018-01-11 MED ORDER — LOSARTAN POTASSIUM 100 MG PO TABS: 100.0000 mg | ORAL_TABLET | Freq: Every day | ORAL | 3 refills | Status: DC

## 2018-01-11 MED ORDER — POLYETHYLENE GLYCOL 3350 17 GM/SCOOP PO POWD: 17.0000 g | Freq: Two times a day (BID) | ORAL | 1 refills | Status: DC | PRN

## 2018-01-11 NOTE — Progress Notes (Signed)
Subjective:    Patient ID: [name], male    DOB: 12/25/1945, 72 y.o.   MRN: [id number]  HPI  Here for wellness and f/u;  Overall doing ok;  Pt denies Chest pain, worsening SOB, DOE, wheezing, orthopnea, PND, worsening LE edema, palpitations, dizziness or syncope.  Pt denies neurological change such as new headache, facial or extremity weakness.  Pt denies polydipsia, polyuria, or low sugar symptoms. Pt states overall good compliance with treatment and medications, good tolerability, and has been trying to follow appropriate diet.  Pt denies worsening depressive symptoms, suicidal ideation or panic. No fever, night sweats, wt loss, loss of appetite, or other constitutional symptoms.  Pt states good ability with ADL's, has low fall risk, home safety reviewed and adequate, no other significant changes in hearing or vision, and only occasionally active with exercise.. BP Readings from Last 3 Encounters:  01/11/18 112/74  01/05/18 91/64  07/11/17 124/68   Wt Readings from Last 3 Encounters:  01/11/18 199 lb 1.9 oz (90.3 kg)  01/05/18 220 lb (99.8 kg)  07/11/17 211 lb (95.7 kg)  Was in ED after a colon cleansing misadventure with mld low sodium and AKI;  Still havin chronic constipation Past Medical History:  Diagnosis Date  . Benign colon polyp   . Depression 12/30/2013  . Hypertension   . Personal history of colonic polyps 12/30/2013   Per colonoscopy at [facility] per pt, about sept 2014  . PTSD (post-traumatic stress disorder)    possible [place] vet   No past surgical history on file.  reports that he has never smoked. He has never used smokeless tobacco. He reports that he does not drink alcohol or use drugs. family history includes Hypertension in his unknown relative. Allergies  Allergen Reactions  . Lisinopril     Angioedema? Patient reports that his tongue and mouth became very swollen, was seen at [facility] for this in 2014.    Current Outpatient Medications on File Prior to Visit    Medication Sig Dispense Refill  . losartan (COZAAR) 100 MG tablet Take 1 tablet (100 mg total) by mouth daily. 90 tablet 3  . NIFEdipine (PROCARDIA-XL/ADALAT-CC/NIFEDICAL-XL) 30 MG 24 hr tablet Take 1 tablet (30 mg total) by mouth daily. 90 tablet 3  . sildenafil (VIAGRA) 100 MG tablet Take 0.5-1 tablets (50-100 mg total) by mouth daily as needed for erectile dysfunction. 5 tablet 11  . tadalafil (CIALIS) 20 MG tablet Take 1 tablet (20 mg total) by mouth daily as needed for erectile dysfunction. 10 tablet 11  . escitalopram (LEXAPRO) 10 MG tablet Take 1 tablet (10 mg total) by mouth daily. 90 tablet 3   No current facility-administered medications on file prior to visit.    Review of Systems Constitutional: Negative for other unusual diaphoresis, sweats, appetite or weight changes HENT: Negative for other worsening hearing loss, ear pain, facial swelling, mouth sores or neck stiffness.   Eyes: Negative for other worsening pain, redness or other visual disturbance.  Respiratory: Negative for other stridor or swelling Cardiovascular: Negative for other palpitations or other chest pain  Gastrointestinal: Negative for worsening diarrhea or loose stools, blood in stool, distention or other pain Genitourinary: Negative for hematuria, flank pain or other change in urine volume.  Musculoskeletal: Negative for myalgias or other joint swelling.  Skin: Negative for other color change, or other wound or worsening drainage.  Neurological: Negative for other syncope or numbness. Hematological: Negative for other adenopathy or swelling Psychiatric/Behavioral: Negative for hallucinations, other worsening agitation,  SI, self-injury, or new decreased concentration All other system neg per pt    Objective:   Physical Exam BP 112/74   Pulse 62   Temp 98.4 F (36.9 C) (Oral)   Resp 16   Ht 5\' 7"  (1.702 m)   Wt 199 lb 1.9 oz (90.3 kg)   SpO2 94%   BMI 31.19 kg/m  VS noted,  Constitutional: Pt is  oriented to person, place, and time. Appears well-developed and well-nourished, in no significant distress and comfortable Head: Normocephalic and atraumatic  Eyes: Conjunctivae and EOM are normal. Pupils are equal, round, and reactive to light Right Ear: External ear normal without discharge Left Ear: External ear normal without discharge Nose: Nose without discharge or deformity Mouth/Throat: Oropharynx is without other ulcerations and moist  Neck: Normal range of motion. Neck supple. No JVD present. No tracheal deviation present or significant neck LA or mass Cardiovascular: Normal rate, regular rhythm, normal heart sounds and intact distal pulses.   Pulmonary/Chest: WOB normal and breath sounds without rales or wheezing  Abdominal: Soft. Bowel sounds are normal. NT. No HSM  Musculoskeletal: Normal range of motion. Exhibits no edema Lymphadenopathy: Has no other cervical adenopathy.  Neurological: Pt is alert and oriented to person, place, and time. Pt has normal reflexes. No cranial nerve deficit. Motor grossly intact, Gait intact Skin: Skin is warm and dry. No rash noted or new ulcerations Psychiatric:  Has normal mood and affect. Behavior is normal without agitation No other exam findings Lab Results  Component Value Date   WBC 3.1 (L) 01/05/2018   HGB 14.0 01/05/2018   HCT 43.4 01/05/2018   PLT 121 (L) 01/05/2018   GLUCOSE 90 01/05/2018   CHOL 137 01/11/2017   TRIG 71.0 01/11/2017   HDL 50.60 01/11/2017   LDLCALC 72 01/11/2017   ALT 19 01/11/2017   AST 21 01/11/2017   NA 133 (L) 01/05/2018   K 4.5 01/05/2018   CL 97 (L) 01/05/2018   CREATININE 1.46 (H) 01/05/2018   BUN 5 (L) 01/05/2018   CO2 26 01/05/2018   TSH 1.47 01/11/2017   PSA 1.05 01/11/2017       Assessment & Plan:

## 2018-01-11 NOTE — Patient Instructions (Addendum)
Please take all new medication as prescribed  - the miralax daily  Please continue all other medications as before, and refills have been done if requested.  Please have the pharmacy call with any other refills you may need.  Please continue your efforts at being more active, low cholesterol diet, and weight control.  You are otherwise up to date with prevention measures today.  Please keep your appointments with your specialists as you may have planned  Please go to the LAB in the Basement (turn left off the elevator) for the tests to be done today  You will be contacted by phone if any changes need to be made immediately.  Otherwise, you will receive a letter about your results with an explanation, but please check with MyChart first.  Please remember to sign up for MyChart if you have not done so, as this will be important to you in the future with finding out test results, communicating by private email, and scheduling acute appointments online when needed.  Please return in 6 months, or sooner if needed

## 2018-01-11 NOTE — Assessment & Plan Note (Signed)
Ok for [employer] daily

## 2018-01-11 NOTE — Assessment & Plan Note (Signed)

## 2018-01-11 NOTE — Assessment & Plan Note (Signed)
stable overall by history and exam, recent data reviewed with pt, and pt to continue medical treatment as before,  to f/u any worsening symptoms or concerns  

## 2018-01-14 ENCOUNTER — Ambulatory Visit: Payer: BC Managed Care – PPO | Admitting: Internal Medicine

## 2018-09-16 ENCOUNTER — Ambulatory Visit (INDEPENDENT_AMBULATORY_CARE_PROVIDER_SITE_OTHER): Payer: BC Managed Care – PPO | Admitting: Internal Medicine

## 2018-09-16 ENCOUNTER — Telehealth: Payer: Self-pay | Admitting: Internal Medicine

## 2018-09-16 ENCOUNTER — Encounter: Payer: Self-pay | Admitting: Internal Medicine

## 2018-09-16 DIAGNOSIS — F32A Depression, unspecified: Secondary | ICD-10-CM

## 2018-09-16 DIAGNOSIS — F329 Major depressive disorder, single episode, unspecified: Secondary | ICD-10-CM | POA: Diagnosis not present

## 2018-09-16 DIAGNOSIS — K5909 Other constipation: Secondary | ICD-10-CM

## 2018-09-16 DIAGNOSIS — I10 Essential (primary) hypertension: Secondary | ICD-10-CM

## 2018-09-16 DIAGNOSIS — Z0000 Encounter for general adult medical examination without abnormal findings: Secondary | ICD-10-CM

## 2018-09-16 MED ORDER — POLYETHYLENE GLYCOL 3350 17 GM/SCOOP PO POWD: 17.0000 g | Freq: Two times a day (BID) | ORAL | 11 refills | Status: DC | PRN

## 2018-09-16 MED ORDER — LINACLOTIDE 72 MCG PO CAPS: 72.0000 ug | ORAL_CAPSULE | Freq: Every day | ORAL | 5 refills | Status: DC

## 2018-09-16 NOTE — Assessment & Plan Note (Signed)
Pt encouraged to check BP on regular basis, with goal at least < 140/90

## 2018-09-16 NOTE — Assessment & Plan Note (Signed)
stable overall by history and exam, recent data reviewed with pt, and pt to continue medical treatment as before,  to f/u any worsening symptoms or concerns  

## 2018-09-16 NOTE — Telephone Encounter (Signed)
DOXY visit scheduled today @ 4:00.

## 2018-09-16 NOTE — Telephone Encounter (Signed)
Copied from [place] [phone]. Topic: Referral - Request for Referral >> Sep 16, 2018  8:19 AM Lionel December wrote: Has patient seen PCP for this complaint? No. *If NO, is insurance requiring patient see PCP for this issue before PCP can refer them? Referral for which specialty: Gastroenterology  Preferred provider/office: First available  Reason for referral: Bowels not functioning properly

## 2018-09-16 NOTE — Patient Instructions (Signed)
Please take all new medication as prescribed - the miralax at least for a few days, then try the linzess if not working well  Please continue all other medications as before, and refills have been done if requested.  Please have the pharmacy call with any other refills you may need..  Please keep your appointments with your specialists as you may have planned  You will be contacted regarding the referral for: Gastroenterology  Please return in about 3 months (after sept 27, 2020), or sooner if needed, with Lab testing done 3-5 days before

## 2018-09-16 NOTE — Assessment & Plan Note (Signed)
With mild recent worsening, for miralax asd trial, then linzess if not improved,  Refer GI per pt request

## 2018-09-16 NOTE — Progress Notes (Signed)
Patient ID: [name], male   DOB: Feb 07, 1946, 73 y.o.   MRN: [id number]  Virtual Visit via Video Note  I connected with [name] on 09/16/18 at  4:00 PM EDT by a video enabled telemedicine application and verified that I am speaking with the correct person using two identifiers.  Location: Patient: at home with wife Provider: at office   I discussed the limitations of evaluation and management by telemedicine and the availability of in person appointments. The patient expressed understanding and agreed to proceed.  History of Present Illness: Here to f/u chronic constipation, some worsening mod to severe recently now having to use enema qod on a regular basis for several month close to 1 yr. No recent significant diet or activity change or narcotic med use.  Denies worsening reflux, abd pain, dysphagia, n/v, diarrhea or blood.  Nothing else seems to make better or worse.  Pt denies chest pain, increased sob or doe, wheezing, orthopnea, PND, increased LE swelling, palpitations, dizziness or syncope.  Pt denies new neurological symptoms such as new headache, or facial or extremity weakness or numbness   Pt denies polydipsia, polyuria.  Last colonoscopy about 2-3 yrs ago at [place].  Pt asks for GI referral as well.  Denies worsening depressive symptoms, suicidal ideation, or panic Past Medical History:  Diagnosis Date  . Benign colon polyp   . Depression 12/30/2013  . Hypertension   . Personal history of colonic polyps 12/30/2013   Per colonoscopy at [facility] per pt, about sept 2014  . PTSD (post-traumatic stress disorder)    possible [place] vet   No past surgical history on file.  reports that he has never smoked. He has never used smokeless tobacco. He reports that he does not drink alcohol or use drugs. family history includes Hypertension in his unknown relative. Allergies  Allergen Reactions  . Lisinopril     Angioedema? Patient reports that his tongue and mouth became very  swollen, was seen at [facility] for this in 2014.    Current Outpatient Medications on File Prior to Visit  Medication Sig Dispense Refill  . escitalopram (LEXAPRO) 10 MG tablet Take 1 tablet (10 mg total) by mouth daily. 90 tablet 3  . losartan (COZAAR) 100 MG tablet Take 1 tablet (100 mg total) by mouth daily. 90 tablet 3  . NIFEdipine (PROCARDIA-XL/ADALAT-CC/NIFEDICAL-XL) 30 MG 24 hr tablet Take 1 tablet (30 mg total) by mouth daily. 90 tablet 3  . sildenafil (VIAGRA) 100 MG tablet Take 0.5-1 tablets (50-100 mg total) by mouth daily as needed for erectile dysfunction. 5 tablet 11  . tadalafil (CIALIS) 20 MG tablet Take 1 tablet (20 mg total) by mouth daily as needed for erectile dysfunction. 10 tablet 11   No current facility-administered medications on file prior to visit.     Observations/Objective: Alert, NAD, appropriate mood and affect, resps normal, cn 2-12 intact, moves all 4s, no visible rash or swelling Lab Results  Component Value Date   WBC 3.5 (L) 01/11/2018   HGB 12.7 (L) 01/11/2018   HCT 38.0 (L) 01/11/2018   PLT 126.0 (L) 01/11/2018   GLUCOSE 93 01/11/2018   CHOL 125 01/11/2018   TRIG 58.0 01/11/2018   HDL 43.90 01/11/2018   LDLCALC 69 01/11/2018   ALT 17 01/11/2018   AST 17 01/11/2018   NA 139 01/11/2018   K 4.4 01/11/2018   CL 103 01/11/2018   CREATININE 1.37 01/11/2018   BUN 10 01/11/2018   CO2 32 01/11/2018   TSH  0.53 01/11/2018   PSA 0.86 01/11/2018   Assessment and Plan: See notes  Follow Up Instructions: See notes   I discussed the assessment and treatment plan with the patient. The patient was provided an opportunity to ask questions and all were answered. The patient agreed with the plan and demonstrated an understanding of the instructions.   The patient was advised to call back or seek an in-person evaluation if the symptoms worsen or if the condition fails to improve as anticipated.   Cathlean Cower, MD

## 2018-09-16 NOTE — Telephone Encounter (Signed)
I would ask for Doxy or inperson visit later today to see what needs to be done, thanks

## 2018-09-23 ENCOUNTER — Encounter: Payer: Self-pay | Admitting: General Surgery

## 2018-09-24 ENCOUNTER — Other Ambulatory Visit: Payer: Self-pay

## 2018-09-24 ENCOUNTER — Ambulatory Visit (INDEPENDENT_AMBULATORY_CARE_PROVIDER_SITE_OTHER): Payer: BC Managed Care – PPO | Admitting: Gastroenterology

## 2018-09-24 ENCOUNTER — Telehealth: Payer: Self-pay | Admitting: Gastroenterology

## 2018-09-24 ENCOUNTER — Encounter: Payer: Self-pay | Admitting: Gastroenterology

## 2018-09-24 VITALS — Ht 67.0 in | Wt 220.0 lb

## 2018-09-24 DIAGNOSIS — Z86010 Personal history of colonic polyps: Secondary | ICD-10-CM

## 2018-09-24 DIAGNOSIS — K5904 Chronic idiopathic constipation: Secondary | ICD-10-CM

## 2018-09-24 MED ORDER — SUPREP BOWEL PREP KIT 17.5-3.13-1.6 GM/177ML PO SOLN: 1.0000 | ORAL | 0 refills | Status: DC

## 2018-09-24 MED ORDER — PLECANATIDE 3 MG PO TABS: 1.0000 | ORAL_TABLET | Freq: Every day | ORAL | 2 refills | Status: DC

## 2018-09-24 NOTE — Progress Notes (Signed)
History of Present Illness: This is a 73 year old male referred by Biagio Borg, MD for the evaluation of constipation and a personal history of adenomatous colon polyps. Worsening constipation over many yeard and uses enemas frequently, often qod. Linzess recently started lead to diarrhea. Denies weight loss, abdominal pain,  change in stool caliber, melena, hematochezia, nausea, vomiting, dysphagia, reflux symptoms, chest pain.   CBC 12/2017 Hb=12.7, plts=126k  Colonoscopy 01/2013 1 cm adenomatous polyp  EGD 01/2013 Normal    Allergies  Allergen Reactions  . Lisinopril     Angioedema? Patient reports that his tongue and mouth became very swollen, was seen at [facility] for this in 2014.    Outpatient Medications Prior to Visit  Medication Sig Dispense Refill  . escitalopram (LEXAPRO) 10 MG tablet Take 1 tablet (10 mg total) by mouth daily. 90 tablet 3  . linaclotide (LINZESS) 72 MCG capsule Take 1 capsule (72 mcg total) by mouth daily before breakfast. 30 capsule 5  . losartan (COZAAR) 100 MG tablet Take 1 tablet (100 mg total) by mouth daily. 90 tablet 3  . NIFEdipine (PROCARDIA-XL/ADALAT-CC/NIFEDICAL-XL) 30 MG 24 hr tablet Take 1 tablet (30 mg total) by mouth daily. 90 tablet 3  . polyethylene glycol powder (GLYCOLAX/MIRALAX) 17 GM/SCOOP powder Take 17 g by mouth 2 (two) times daily as needed. 3350 g 11  . sildenafil (VIAGRA) 100 MG tablet Take 0.5-1 tablets (50-100 mg total) by mouth daily as needed for erectile dysfunction. (Patient not taking: Reported on 09/23/2018) 5 tablet 11  . tadalafil (CIALIS) 20 MG tablet Take 1 tablet (20 mg total) by mouth daily as needed for erectile dysfunction. (Patient not taking: Reported on 09/23/2018) 10 tablet 11   No facility-administered medications prior to visit.    Past Medical History:  Diagnosis Date  . Depression 12/30/2013  . Hypertension   . Personal history of colonic polyps 12/30/2013   Per colonoscopy at [facility] per pt, about sept 2014  .  PTSD (post-traumatic stress disorder)    possible [place] vet  . Tubular adenoma of colon 2014   No past surgical history on file. Social History   Socioeconomic History  . Marital status: Single    Spouse name: Not on file  . Number of children: Not on file  . Years of education: Not on file  . Highest education level: Not on file  Occupational History  . Not on file  Social Needs  . Financial resource strain: Not on file  . Food insecurity:    Worry: Not on file    Inability: Not on file  . Transportation needs:    Medical: Not on file    Non-medical: Not on file  Tobacco Use  . Smoking status: Never Smoker  . Smokeless tobacco: Never Used  Substance and Sexual Activity  . Alcohol use: No    Alcohol/week: 0.0 standard drinks  . Drug use: No  . Sexual activity: Not on file  Lifestyle  . Physical activity:    Days per week: Not on file    Minutes per session: Not on file  . Stress: Not on file  Relationships  . Social connections:    Talks on phone: Not on file    Gets together: Not on file    Attends religious service: Not on file    Active member of club or organization: Not on file    Attends meetings of clubs or organizations: Not on file    Relationship status: Not  on file  Other Topics Concern  . 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 Hypertension Unknown        Review of Systems: Pertinent positive and negative review of systems were noted in the above HPI section. All other review of systems were otherwise negative.    Physical Exam: Telemedicine - not performed    Assessment and Recommendations:  1. Constipation, longstanding.  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  Rule out colorectal neoplasms, strictures and other disorders.  Discontinue Linzess.  Begin  Trulance 3 mg po qd. Schedule colonoscopy. The risks (including bleeding, perforation, infection, missed lesions, medication reactions and possible hospitalization or surgery if complications occur), benefits, and alternatives to colonoscopy with possible biopsy and possible polypectomy were discussed with the patient and they consent to proceed.   2. Personal history of a  1 cm adenomatous colon polyp.  He is overdue for recommended 3-year interval surveillance colonoscopy.  Colonoscopy as above.    These services were provided via telemedicine, audio only per patient request.  The patient was at home and his wife was included in the visit and the provider was in the office, alone.  We discussed the limitations of evaluation and management by telemedicine and the availability of in person appointments.  Patient consented for this telemedicine visit and is aware of possible charges for this service.  Office CMA or LPN participated in this telemedicine service.  Time spent on call: 15 minutes     cc: Biagio Borg, MD [place] [place], [place] [address]

## 2018-09-24 NOTE — Telephone Encounter (Signed)
Patient wife called said that she called the pharmacy and was told that the med Plecanatide (TRULANCE) 3 MG TABS was to price.

## 2018-09-24 NOTE — Patient Instructions (Signed)
You have been scheduled for a colonoscopy. Please follow written instructions given to you at your visit today.  Please pick up your prep supplies at the pharmacy within the next 1-3 days. If you use inhalers (even only as needed), please bring them with you on the day of your procedure. Your physician has requested that you go to www.startemmi.com and enter the access code given to you at your visit today. This web site gives a general overview about your procedure. However, you should still follow specific instructions given to you by our office regarding your preparation for the procedure.  Discontinue Linzess.   Start Trulance 3mg - once daily by mouth.   We have sent the following medications to your pharmacy for you to pick up at your convenience: Trulance , Suprep   Thank you for choosing me and [facility].  Pricilla Riffle. Dagoberto Ligas., MD., Marval Regal

## 2018-09-24 NOTE — Telephone Encounter (Signed)
PA was required for Trulance. PA was sent via Covermymeds and was approved. Pharmacy to contact patient once rx has been filled. Patient's wife also informed.

## 2018-09-25 ENCOUNTER — Telehealth: Payer: Self-pay | Admitting: Gastroenterology

## 2018-09-25 NOTE — Telephone Encounter (Signed)
I spoke with [name], patients care giver and told her to call patients insurance company for an alternative medication that may be cheaper for the patient.  I told her to get the name of medication they recommend and give [place] a call back and we will check with Dr. Fuller Plan to see if he approves of this medication and we will send to the pharmacy if approved.

## 2018-09-26 ENCOUNTER — Telehealth: Payer: Self-pay | Admitting: Gastroenterology

## 2018-09-26 NOTE — Telephone Encounter (Signed)
Miralax 1/2 scoop to 2 scoops/day adjusted to achieve 1-2 complete bowel movements/day

## 2018-09-26 NOTE — Telephone Encounter (Signed)
Dr. Fuller Plan, I spoke with Ms. [name] (patients caretaker) yesterday.  She stated that Trulance is $200 and that Mr. [name] can not afford this.  She requested an alternative medication.  I asked her to call his insurance company to see if they could recommend an alternative medication that would be less expensive.  She states that there is not another drug the insurance could recommend.  Please advise.  Thanks, [name]

## 2018-09-26 NOTE — Telephone Encounter (Signed)
[name] called in wanting to speak with the nurse about the medication trulance that was recommended for the patient. She stated that it was too much and want to discuss another kind.

## 2018-09-26 NOTE — Telephone Encounter (Signed)
I spoke with Ms. Valere Dross and relayed to her Dr. Silvio Pate instructions.  She verbalized understanding.

## 2018-10-16 ENCOUNTER — Telehealth: Payer: Self-pay | Admitting: Gastroenterology

## 2018-10-16 NOTE — Telephone Encounter (Signed)
Spoke with patient regarding Covid-19 screening questions. °Covid-19 Screening Questions: ° °Do you now or have you had a fever in the last 14 days?  ° °Do you have any respiratory symptoms of shortness of breath or cough now or in the last 14 days?  ° °Do you have any family members or close contacts with diagnosed or suspected Covid-19 in the past 14 days?  ° °Have you been tested for Covid-19 and found to be positive?  ° °Pt made aware of that care partner may wait in the car or come up to the lobby during the procedure but will need to provide their own mask. °

## 2018-10-17 ENCOUNTER — Encounter: Payer: Self-pay | Admitting: Gastroenterology

## 2018-10-17 ENCOUNTER — Telehealth: Payer: Self-pay | Admitting: Gastroenterology

## 2018-10-17 ENCOUNTER — Ambulatory Visit (AMBULATORY_SURGERY_CENTER): Payer: BC Managed Care – PPO | Admitting: Gastroenterology

## 2018-10-17 ENCOUNTER — Other Ambulatory Visit: Payer: Self-pay

## 2018-10-17 VITALS — BP 150/88 | HR 55 | Temp 98.5°F | Resp 16 | Ht 67.0 in | Wt 220.0 lb

## 2018-10-17 DIAGNOSIS — K5904 Chronic idiopathic constipation: Secondary | ICD-10-CM

## 2018-10-17 DIAGNOSIS — Z86010 Personal history of colonic polyps: Secondary | ICD-10-CM

## 2018-10-17 MED ORDER — SODIUM CHLORIDE 0.9 % IV SOLN: 500.0000 mL | Freq: Once | INTRAVENOUS | Status: DC

## 2018-10-17 NOTE — Progress Notes (Signed)
A and O x3. Report to RN. Tolerated MAC anesthesia well.

## 2018-10-17 NOTE — Op Note (Signed)
[facility] Patient Name: [name] Procedure Date: 10/17/2018 8:03 AM MRN: [id number] Endoscopist: Ladene Artist , MD Age: 73 Referring MD:  Date of Birth: Sep 10, 1945 Gender: Male Account #: [id number] Procedure:                Colonoscopy Indications:              Surveillance: Personal history of adenomatous                            polyps on last colonoscopy > 5 years ago Medicines:                Monitored Anesthesia Care Procedure:                Pre-Anesthesia Assessment:                           - Prior to the procedure, a History and Physical                            was performed, and patient medications and                            allergies were reviewed. The patient's tolerance of                            previous anesthesia was also reviewed. The risks                            and benefits of the procedure and the sedation                            options and risks were discussed with the patient.                            All questions were answered, and informed consent                            was obtained. Prior Anticoagulants: The patient has                            taken no previous anticoagulant or antiplatelet                            agents. ASA Grade Assessment: II - A patient with                            mild systemic disease. After reviewing the risks                            and benefits, the patient was deemed in                            satisfactory condition to undergo the procedure.  After obtaining informed consent, the colonoscope                            was passed under direct vision. Throughout the                            procedure, the patient's blood pressure, pulse, and                            oxygen saturations were monitored continuously. The                            Colonoscope was introduced through the anus and                            advanced to the the  cecum, identified by                            appendiceal orifice and ileocecal valve. The                            ileocecal valve, appendiceal orifice, and rectum                            were photographed. The quality of the bowel                            preparation was good. The colonoscopy was performed                            without difficulty. The patient tolerated the                            procedure well. Scope In: 8:09:26 AM Scope Out: 8:23:04 AM Scope Withdrawal Time: 0 hours 11 minutes 34 seconds  Total Procedure Duration: 0 hours 13 minutes 38 seconds  Findings:                 The perianal and digital rectal examinations were                            normal.                           Internal hemorrhoids were found during                            retroflexion. The hemorrhoids were small and Grade                            I (internal hemorrhoids that do not prolapse).                           The exam was otherwise without abnormality on  direct and retroflexion views. Complications:            No immediate complications. Estimated blood loss:                            None. Estimated Blood Loss:     Estimated blood loss: none. Impression:               - Internal hemorrhoids.                           - The examination was otherwise normal on direct                            and retroflexion views.                           - No specimens collected. Recommendation:           - Repeat colonoscopy in 5 years for surveillance.                           - Patient has a contact number available for                            emergencies. The signs and symptoms of potential                            delayed complications were discussed with the                            patient. Return to normal activities tomorrow.                            Written discharge instructions were provided to the                             patient.                           - Resume previous diet.                           - Continue present medications. Ladene Artist, MD 10/17/2018 8:31:35 AM This report has been signed electronically.

## 2018-10-17 NOTE — Telephone Encounter (Signed)
All questions answered about the colonoscopy today.  His wife had questions about the report and tx for his constipation.  They could not afford Trulance.  Wife is advised that they need to continue the Miralx 1-2 times a day. She will call back for any additional questions or concerns.

## 2018-10-17 NOTE — Telephone Encounter (Signed)
Pt's wife called to inform that pt is having BM issues.  She is concerned and would like a call back.

## 2018-10-17 NOTE — Patient Instructions (Signed)
Thank you for allowing [place] to participate in your care today  Resume previous diet and medications today.  Return to your normal activities tomorrow.  Recommend next surveillance colonoscopy in 5 years.       YOU HAD AN ENDOSCOPIC PROCEDURE TODAY AT [facility]:   Refer to the procedure report that was given to you for any specific questions about what was found during the examination.  If the procedure report does not answer your questions, please call your gastroenterologist to clarify.  If you requested that your care partner not be given the details of your procedure findings, then the procedure report has been included in a sealed envelope for you to review at your convenience later.  YOU SHOULD EXPECT: Some feelings of bloating in the abdomen. Passage of more gas than usual.  Walking can help get rid of the air that was put into your GI tract during the procedure and reduce the bloating. If you had a lower endoscopy (such as a colonoscopy or flexible sigmoidoscopy) you may notice spotting of blood in your stool or on the toilet paper. If you underwent a bowel prep for your procedure, you may not have a normal bowel movement for a few days.  Please Note:  You might notice some irritation and congestion in your nose or some drainage.  This is from the oxygen used during your procedure.  There is no need for concern and it should clear up in a day or so.  SYMPTOMS TO REPORT IMMEDIATELY:   Following lower endoscopy (colonoscopy or flexible sigmoidoscopy):  Excessive amounts of blood in the stool  Significant tenderness or worsening of abdominal pains  Swelling of the abdomen that is new, acute  Fever of 100F or higher  For urgent or emergent issues, a gastroenterologist can be reached at any hour by calling [phone].   DIET:  We do recommend a small meal at first, but then you may proceed to your regular diet.  Drink plenty of fluids but you should avoid  alcoholic beverages for 24 hours.  ACTIVITY:  You should plan to take it easy for the rest of today and you should NOT DRIVE or use heavy machinery until tomorrow (because of the sedation medicines used during the test).    FOLLOW UP: Our staff will call the number listed on your records 48-72 hours following your procedure to check on you and address any questions or concerns that you may have regarding the information given to you following your procedure. If we do not reach you, we will leave a message.  We will attempt to reach you two times.  During this call, we will ask if you have developed any symptoms of COVID 19. If you develop any symptoms (ie: fever, flu-like symptoms, shortness of breath, cough etc.) before then, please call [phone].  If you test positive for Covid 19 in the 2 weeks post procedure, please call and report this information to [place].    If any biopsies were taken you will be contacted by phone or by letter within the next 1-3 weeks.  Please call us at [phone] if you have not heard about the biopsies in 3 weeks.    SIGNATURES/CONFIDENTIALITY: You and/or your care partner have signed paperwork which will be entered into your electronic medical record.  These signatures attest to the fact that that the information above on your After Visit Summary has been reviewed and is understood.  Full responsibility of the  confidentiality of this discharge information lies with you and/or your care-partner. 

## 2018-10-17 NOTE — Progress Notes (Signed)
[name] [name]

## 2018-10-21 ENCOUNTER — Telehealth: Payer: Self-pay | Admitting: *Deleted

## 2018-10-21 NOTE — Telephone Encounter (Signed)
  Follow up Call-  Call back number 10/17/2018  Post procedure Call Back phone  # [phone] or [phone]  Permission to leave phone message Yes  Some recent data might be hidden     Patient questions:  Do you have a fever, pain , or abdominal swelling? No. Pain Score  0 *  Have you tolerated food without any problems? Yes.    Have you been able to return to your normal activities? Yes.    Do you have any questions about your discharge instructions: Diet   No. Medications  No. Follow up visit  No.  Do you have questions or concerns about your Care? No.  Actions: * If pain score is 4 or above: No action needed, pain <4.  1. Have you developed a fever since your procedure? no  2.   Have you had an respiratory symptoms (SOB or cough) since your procedure? no  3.   Have you tested positive for COVID 19 since your procedure no  4.   Have you had any family members/close contacts diagnosed with the COVID 19 since your procedure?  no   If yes to any of these questions please route to Joylene John, RN and Alphonsa Gin, Therapist, sports.

## 2018-11-22 ENCOUNTER — Ambulatory Visit (INDEPENDENT_AMBULATORY_CARE_PROVIDER_SITE_OTHER): Payer: BC Managed Care – PPO | Admitting: Internal Medicine

## 2018-11-22 DIAGNOSIS — Z01818 Encounter for other preprocedural examination: Secondary | ICD-10-CM | POA: Diagnosis not present

## 2018-11-22 DIAGNOSIS — I10 Essential (primary) hypertension: Secondary | ICD-10-CM | POA: Diagnosis not present

## 2018-11-22 DIAGNOSIS — F329 Major depressive disorder, single episode, unspecified: Secondary | ICD-10-CM | POA: Diagnosis not present

## 2018-11-22 DIAGNOSIS — F4310 Post-traumatic stress disorder, unspecified: Secondary | ICD-10-CM

## 2018-11-22 DIAGNOSIS — F32A Depression, unspecified: Secondary | ICD-10-CM

## 2018-11-22 NOTE — Progress Notes (Signed)
Patient ID: [name], male   DOB: [birth year], 73 y.o.   MRN: [id number]  Virtual Visit via Video Note  I connected with [name] on 11/22/18 at  1:20 PM EDT by a video enabled telemedicine application and verified that I am speaking with the correct person using two identifiers.  Location: Patient: at home Provider: at office   I discussed the limitations of evaluation and management by telemedicine and the availability of in person appointments. The patient expressed understanding and agreed to proceed.  History of Present Illness: Here to f/u; overall doing ok,  Pt denies chest pain, increasing sob or doe, wheezing, orthopnea, PND, increased LE swelling, palpitations, dizziness or syncope.  Pt denies new neurological symptoms such as new headache, or facial or extremity weakness or numbness.  Pt denies polydipsia, polyuria, or low sugar episode.  Pt states overall good compliance with meds, mostly trying to follow appropriate diet, with wt overall stable,  but little exercise however.  Has seen optho recently with astigmatism and planning surgury soon.  As rec'd by optho to have ECG, cxr, cbc preop.  Denies worsening depressive symptoms, suicidal ideation, or panic; has ongoing anxiety, not increased recently.   BP at home < 140/90, has been more compliants with tx recently.   Past Medical History:  Diagnosis Date  . Depression 12/30/2013  . Hypertension   . Personal history of colonic polyps 12/30/2013   Per colonoscopy at [facility] per pt, about sept 2014  . PTSD (post-traumatic stress disorder)    possible [place] vet  . Tubular adenoma of colon 2014   Past Surgical History:  Procedure Laterality Date  . COLONOSCOPY    . UPPER GASTROINTESTINAL ENDOSCOPY      reports that he has never smoked. He has never used smokeless tobacco. He reports that he does not drink alcohol or use drugs. family history includes Hypertension in an other family member. Allergies  Allergen Reactions   . Lisinopril     Angioedema? Patient reports that his tongue and mouth became very swollen, was seen at [facility] for this in 2014.    Current Outpatient Medications on File Prior to Visit  Medication Sig Dispense Refill  . escitalopram (LEXAPRO) 10 MG tablet Take 1 tablet (10 mg total) by mouth daily. 90 tablet 3  . losartan (COZAAR) 100 MG tablet Take 1 tablet (100 mg total) by mouth daily. 90 tablet 3  . NIFEdipine (PROCARDIA-XL/ADALAT-CC/NIFEDICAL-XL) 30 MG 24 hr tablet Take 1 tablet (30 mg total) by mouth daily. 90 tablet 3  . polyethylene glycol powder (GLYCOLAX/MIRALAX) 17 GM/SCOOP powder Take 17 g by mouth 2 (two) times daily as needed. (Patient taking differently: Take 17 g by mouth as directed. 1/2 to 2 scoops daily to achieve 1-2 complete bowel movements.) 3350 g 11  . sildenafil (VIAGRA) 100 MG tablet Take 0.5-1 tablets (50-100 mg total) by mouth daily as needed for erectile dysfunction. (Patient not taking: Reported on 09/23/2018) 5 tablet 11  . tadalafil (CIALIS) 20 MG tablet Take 1 tablet (20 mg total) by mouth daily as needed for erectile dysfunction. (Patient not taking: Reported on 09/23/2018) 10 tablet 11   No current facility-administered medications on file prior to visit.     Observations/Objective: The patient will observe these symptoms, and report promptly any worsening or unexpected persistence.  If well, may return prn. Lab Results  Component Value Date   WBC 3.5 (L) 01/11/2018   HGB 12.7 (L) 01/11/2018   HCT 38.0 (L) 01/11/2018  PLT 126.0 (L) 01/11/2018   GLUCOSE 93 01/11/2018   CHOL 125 01/11/2018   TRIG 58.0 01/11/2018   HDL 43.90 01/11/2018   LDLCALC 69 01/11/2018   ALT 17 01/11/2018   AST 17 01/11/2018   NA 139 01/11/2018   K 4.4 01/11/2018   CL 103 01/11/2018   CREATININE 1.37 01/11/2018   BUN 10 01/11/2018   CO2 32 01/11/2018   TSH 0.53 01/11/2018   PSA 0.86 01/11/2018   Assessment and Plan: See notes  Follow Up Instructions: See notes   I  discussed the assessment and treatment plan with the patient. The patient was provided an opportunity to ask questions and all were answered. The patient agreed with the plan and demonstrated an understanding of the instructions.   The patient was advised to call back or seek an in-person evaluation if the symptoms worsen or if the condition fails to improve as anticipated.   Cathlean Cower, MD

## 2018-11-24 ENCOUNTER — Encounter: Payer: Self-pay | Admitting: Internal Medicine

## 2018-11-24 NOTE — Assessment & Plan Note (Signed)
Ok for preop evaluation as recommended,  to f/u any worsening symptoms or concerns

## 2018-11-24 NOTE — Patient Instructions (Addendum)
Please continue all other medications as before, and refills have been done if requested.  Please have the pharmacy call with any other refills you may need.  Please continue your efforts at being more active, low cholesterol diet, and weight control.  Please keep your appointments with your specialists as you may have planned  The office will call for you to have a Nurse Visit appt for the EKG  Please go to the XRAY Department in the Basement (go straight as you get off the elevator) for the x-ray testing at your convenience  Please go to the LAB in the Basement (turn left off the elevator) for the tests to be done   You will be contacted by phone if any changes need to be made immediately.  Otherwise, you will receive a letter about your results with an explanation, but please check with MyChart first.  Please remember to sign up for MyChart if you have not done so, as this will be important to you in the future with finding out test results, communicating by private email, and scheduling acute appointments online when needed.

## 2018-11-24 NOTE — Assessment & Plan Note (Signed)
stable overall by history and exam, recent data reviewed with pt, and pt to continue medical treatment as before,  to f/u any worsening symptoms or concerns  

## 2018-11-26 ENCOUNTER — Other Ambulatory Visit (INDEPENDENT_AMBULATORY_CARE_PROVIDER_SITE_OTHER): Payer: BC Managed Care – PPO

## 2018-11-26 ENCOUNTER — Other Ambulatory Visit: Payer: Self-pay

## 2018-11-26 ENCOUNTER — Ambulatory Visit (INDEPENDENT_AMBULATORY_CARE_PROVIDER_SITE_OTHER)
Admission: RE | Admit: 2018-11-26 | Discharge: 2018-11-26 | Disposition: A | Discharge status: Home / Self Care | Payer: BC Managed Care – PPO | Source: Ambulatory Visit | Attending: Internal Medicine | Admitting: Internal Medicine

## 2018-11-26 ENCOUNTER — Telehealth: Payer: Self-pay | Admitting: Emergency Medicine

## 2018-11-26 DIAGNOSIS — Z01818 Encounter for other preprocedural examination: Secondary | ICD-10-CM | POA: Diagnosis not present

## 2018-11-26 LAB — CBC WITH DIFFERENTIAL/PLATELET
Basophils Absolute: 0 10*3/uL (ref 0.0–0.1)
Basophils Relative: 0.7 % (ref 0.0–3.0)
Eosinophils Absolute: 0.1 10*3/uL (ref 0.0–0.7)
Eosinophils Relative: 1.4 % (ref 0.0–5.0)
HCT: 40.6 % (ref 39.0–52.0)
Hemoglobin: 13.3 g/dL (ref 13.0–17.0)
Lymphocytes Relative: 36.6 % (ref 12.0–46.0)
Lymphs Abs: 1.4 10*3/uL (ref 0.7–4.0)
MCHC: 32.7 g/dL (ref 30.0–36.0)
MCV: 88.7 fl (ref 78.0–100.0)
Monocytes Absolute: 0.3 10*3/uL (ref 0.1–1.0)
Monocytes Relative: 9.4 % (ref 3.0–12.0)
Neutro Abs: 1.9 10*3/uL (ref 1.4–7.7)
Neutrophils Relative %: 51.9 % (ref 43.0–77.0)
Platelets: 118 10*3/uL — ABNORMAL LOW (ref 150.0–400.0)
RBC: 4.58 Mil/uL (ref 4.22–5.81)
RDW: 14 % (ref 11.5–15.5)
WBC: 3.7 10*3/uL — ABNORMAL LOW (ref 4.0–10.5)

## 2018-11-26 NOTE — Telephone Encounter (Signed)
I don't see any notations from PCP on these results yet. Please advise.

## 2018-11-26 NOTE — Telephone Encounter (Signed)
This is a case of primary care abuse, where I have been forced to order testing I did not need in order to satisfy the patient and his ophthalmologist  The cbc is stable, and can be faxed to his optho Dr Gevena Cotton at fax [phone]  The patient still needs ECG and CXR to make them happy, of which I do not have yet have results. thanks

## 2018-11-26 NOTE — Telephone Encounter (Signed)
Pt is requesting a call back. He has some questions about the xray and labs that have been done prior to his eye surgery. He also has questions about needing to have covid testing done before the surgery. Thanks.

## 2018-11-26 NOTE — Telephone Encounter (Signed)
Lab work has been faxed.  Waiting for xray results and EKG to be done.

## 2018-11-27 ENCOUNTER — Ambulatory Visit (INDEPENDENT_AMBULATORY_CARE_PROVIDER_SITE_OTHER): Payer: BC Managed Care – PPO | Admitting: *Deleted

## 2018-11-27 ENCOUNTER — Other Ambulatory Visit: Payer: Self-pay

## 2018-11-27 DIAGNOSIS — I10 Essential (primary) hypertension: Secondary | ICD-10-CM | POA: Diagnosis not present

## 2018-11-27 DIAGNOSIS — Z01818 Encounter for other preprocedural examination: Secondary | ICD-10-CM

## 2018-11-28 NOTE — Progress Notes (Addendum)
ECG I have personally intepreted - NSR 63  Medical screening examination/treatment/procedure(s) were performed by non-physician practitioner and as supervising physician I was immediately available for consultation/collaboration. I agree with above. Cathlean Cower, MD

## 2018-12-21 ENCOUNTER — Other Ambulatory Visit (HOSPITAL_COMMUNITY)
Admission: RE | Admit: 2018-12-21 | Discharge: 2018-12-21 | Disposition: A | Discharge status: Home / Self Care | Payer: BC Managed Care – PPO | Source: Ambulatory Visit | Attending: Ophthalmology | Admitting: Ophthalmology

## 2018-12-21 DIAGNOSIS — H532 Diplopia: Secondary | ICD-10-CM | POA: Diagnosis not present

## 2018-12-21 DIAGNOSIS — Z20828 Contact with and (suspected) exposure to other viral communicable diseases: Secondary | ICD-10-CM | POA: Insufficient documentation

## 2018-12-21 DIAGNOSIS — H4912 Fourth [trochlear] nerve palsy, left eye: Secondary | ICD-10-CM | POA: Insufficient documentation

## 2018-12-21 DIAGNOSIS — Z01812 Encounter for preprocedural laboratory examination: Secondary | ICD-10-CM | POA: Diagnosis not present

## 2018-12-22 LAB — NOVEL CORONAVIRUS, NAA (HOSP ORDER, SEND-OUT TO REF LAB; TAT 18-24 HRS): SARS-CoV-2, NAA: NOT DETECTED

## 2018-12-23 NOTE — H&P (Addendum)
[name] is an 73 y.o. male.   Chief Complaint: I see double when I hold my head straight . 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    Past Medical History:  Diagnosis Date  . Depression 12/30/2013  . Hypertension   . Personal history of colonic polyps 12/30/2013   Per colonoscopy at [facility] per pt, about sept 2014  . PTSD (post-traumatic stress disorder)    possible [place] vet  . Tubular adenoma of colon 2014    Past Surgical History:  Procedure Laterality Date  . COLONOSCOPY    . UPPER GASTROINTESTINAL ENDOSCOPY      Family History  Problem Relation Age of Onset  . Hypertension Other   . Colon cancer Neg Hx   . Esophageal cancer Neg Hx   . Stomach cancer Neg Hx   . Rectal cancer Neg Hx    Social History:  reports that he has never smoked. He has never used smokeless tobacco. He reports that he does not drink alcohol or use drugs.  Allergies:  Allergies  Allergen Reactions  . Lisinopril     Angioedema? Patient reports that his tongue and mouth became very swollen, was seen at [facility] for this in 2014.     No medications prior to admission.    No results found for this or any previous visit (from the past 48 hour(s)). No results found.  Review of Systems  Constitutional: Negative.   HENT: Negative.   Eyes:       Left hypertropia and R head tilt .  Respiratory: Negative.   Gastrointestinal: Negative.   Genitourinary: Negative.   Musculoskeletal: Negative.   Skin: Negative.   Neurological: Negative.   Endo/Heme/Allergies: Negative.   Psychiatric/Behavioral: Negative.   All other systems reviewed and are negative.   There were no vitals taken for this visit. Physical Exam  Constitutional: He appears well-developed.  HENT:  Head: Normocephalic.  Eyes: Pupils are equal, round, and reactive to light.    Neck: Normal range of motion.  Cardiovascular: Normal  rate, regular rhythm and normal heart sounds.  Respiratory: Effort normal and breath sounds normal.  GI: Soft.  Neurological: He is alert.  Skin: Skin is warm.     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  Gevena Cotton, MD 12/23/2018, 3:53 PM

## 2018-12-24 ENCOUNTER — Other Ambulatory Visit: Payer: Self-pay

## 2018-12-24 ENCOUNTER — Encounter (HOSPITAL_BASED_OUTPATIENT_CLINIC_OR_DEPARTMENT_OTHER): Payer: Self-pay | Admitting: *Deleted

## 2018-12-24 NOTE — Progress Notes (Signed)
Spoke with patient and friend via telephone for pre op interview. NPO after MN, patient to take Procardia with a sip of water AM of surgery. Current EKG in Epic. Arrival time 0800.

## 2018-12-25 ENCOUNTER — Ambulatory Visit (HOSPITAL_BASED_OUTPATIENT_CLINIC_OR_DEPARTMENT_OTHER)
Admission: RE | Admit: 2018-12-25 | Discharge: 2018-12-25 | Disposition: A | Discharge status: Home / Self Care | Payer: BC Managed Care – PPO | Attending: Ophthalmology | Admitting: Ophthalmology

## 2018-12-25 ENCOUNTER — Ambulatory Visit (HOSPITAL_BASED_OUTPATIENT_CLINIC_OR_DEPARTMENT_OTHER): Payer: BC Managed Care – PPO | Admitting: Anesthesiology

## 2018-12-25 ENCOUNTER — Encounter (HOSPITAL_BASED_OUTPATIENT_CLINIC_OR_DEPARTMENT_OTHER): Payer: Self-pay | Admitting: Anesthesiology

## 2018-12-25 ENCOUNTER — Encounter (HOSPITAL_BASED_OUTPATIENT_CLINIC_OR_DEPARTMENT_OTHER)
Admission: RE | Disposition: A | Discharge status: Home / Self Care | Payer: Self-pay | Source: Home / Self Care | Attending: Ophthalmology

## 2018-12-25 ENCOUNTER — Other Ambulatory Visit: Payer: Self-pay

## 2018-12-25 DIAGNOSIS — F419 Anxiety disorder, unspecified: Secondary | ICD-10-CM | POA: Insufficient documentation

## 2018-12-25 DIAGNOSIS — I10 Essential (primary) hypertension: Secondary | ICD-10-CM | POA: Insufficient documentation

## 2018-12-25 DIAGNOSIS — Z79899 Other long term (current) drug therapy: Secondary | ICD-10-CM | POA: Diagnosis not present

## 2018-12-25 DIAGNOSIS — E669 Obesity, unspecified: Secondary | ICD-10-CM | POA: Insufficient documentation

## 2018-12-25 DIAGNOSIS — Z6834 Body mass index (BMI) 34.0-34.9, adult: Secondary | ICD-10-CM | POA: Diagnosis not present

## 2018-12-25 DIAGNOSIS — F329 Major depressive disorder, single episode, unspecified: Secondary | ICD-10-CM | POA: Diagnosis not present

## 2018-12-25 DIAGNOSIS — H4912 Fourth [trochlear] nerve palsy, left eye: Secondary | ICD-10-CM | POA: Diagnosis not present

## 2018-12-25 HISTORY — PX: MUSCLE RECESSION AND RESECTION: SHX5209

## 2018-12-25 SURGERY — MUSCLE RECESSION/RESECTION
Anesthesia: General | Site: Eye | Laterality: Left

## 2018-12-25 MED ORDER — DEXAMETHASONE SODIUM PHOSPHATE 10 MG/ML IJ SOLN
INTRAMUSCULAR | Status: AC
  Filled 2018-12-25: qty 1

## 2018-12-25 MED ORDER — TOBRAMYCIN-DEXAMETHASONE 0.3-0.1 % OP OINT
TOPICAL_OINTMENT | OPHTHALMIC | Status: DC | PRN
  Administered 2018-12-25: 1 via OPHTHALMIC

## 2018-12-25 MED ORDER — LACTATED RINGERS IV SOLN
INTRAVENOUS | Status: DC
  Administered 2018-12-25: 09:00:00 via INTRAVENOUS
  Filled 2018-12-25: qty 1000

## 2018-12-25 MED ORDER — KETOROLAC TROMETHAMINE 30 MG/ML IJ SOLN
INTRAMUSCULAR | Status: DC | PRN
  Administered 2018-12-25: 30 mg via INTRAVENOUS

## 2018-12-25 MED ORDER — ONDANSETRON HCL 4 MG/2ML IJ SOLN
INTRAMUSCULAR | Status: AC
  Filled 2018-12-25: qty 2

## 2018-12-25 MED ORDER — PHENYLEPHRINE HCL 2.5 % OP SOLN
OPHTHALMIC | Status: DC | PRN
  Administered 2018-12-25: 3 [drp] via OPHTHALMIC

## 2018-12-25 MED ORDER — ACETAMINOPHEN-CODEINE #3 300-30 MG PO TABS: 1.0000 | ORAL_TABLET | ORAL | 0 refills | Status: DC | PRN

## 2018-12-25 MED ORDER — PROPOFOL 10 MG/ML IV BOLUS
INTRAVENOUS | Status: AC
  Filled 2018-12-25: qty 40

## 2018-12-25 MED ORDER — MIDAZOLAM HCL 2 MG/2ML IJ SOLN
INTRAMUSCULAR | Status: DC | PRN
  Administered 2018-12-25: 2 mg via INTRAVENOUS

## 2018-12-25 MED ORDER — LIDOCAINE 2% (20 MG/ML) 5 ML SYRINGE
INTRAMUSCULAR | Status: DC | PRN
  Administered 2018-12-25: 60 mg via INTRAVENOUS

## 2018-12-25 MED ORDER — FENTANYL CITRATE (PF) 100 MCG/2ML IJ SOLN
INTRAMUSCULAR | Status: DC | PRN
  Administered 2018-12-25: 50 ug via INTRAVENOUS

## 2018-12-25 MED ORDER — NALOXONE HCL 0.4 MG/ML IJ SOLN
INTRAMUSCULAR | Status: AC
  Filled 2018-12-25: qty 1

## 2018-12-25 MED ORDER — POVIDONE-IODINE 5 % OP SOLN
OPHTHALMIC | Status: DC | PRN
  Administered 2018-12-25: 1 via OPHTHALMIC

## 2018-12-25 MED ORDER — TOBRADEX 0.3-0.1 % OP OINT: 1.0000 "application " | TOPICAL_OINTMENT | Freq: Two times a day (BID) | OPHTHALMIC | 0 refills | Status: DC

## 2018-12-25 MED ORDER — BSS IO SOLN
INTRAOCULAR | Status: DC | PRN
  Administered 2018-12-25: 15 mL

## 2018-12-25 MED ORDER — DEXAMETHASONE SODIUM PHOSPHATE 10 MG/ML IJ SOLN
INTRAMUSCULAR | Status: DC | PRN
  Administered 2018-12-25: 5 mg via INTRAVENOUS

## 2018-12-25 MED ORDER — PROPOFOL 10 MG/ML IV BOLUS
INTRAVENOUS | Status: DC | PRN
  Administered 2018-12-25: 140 mg via INTRAVENOUS

## 2018-12-25 MED ORDER — MIDAZOLAM HCL 2 MG/2ML IJ SOLN
INTRAMUSCULAR | Status: AC
  Filled 2018-12-25: qty 2

## 2018-12-25 MED ORDER — FENTANYL CITRATE (PF) 100 MCG/2ML IJ SOLN
INTRAMUSCULAR | Status: AC
  Filled 2018-12-25: qty 2

## 2018-12-25 MED ORDER — DEXMEDETOMIDINE HCL IN NACL 200 MCG/50ML IV SOLN
INTRAVENOUS | Status: AC
  Filled 2018-12-25: qty 50

## 2018-12-25 MED ORDER — KETOROLAC TROMETHAMINE 30 MG/ML IJ SOLN
INTRAMUSCULAR | Status: AC
  Filled 2018-12-25: qty 1

## 2018-12-25 MED ORDER — LIDOCAINE 2% (20 MG/ML) 5 ML SYRINGE
INTRAMUSCULAR | Status: AC
  Filled 2018-12-25: qty 10

## 2018-12-25 MED ORDER — ONDANSETRON HCL 4 MG/2ML IJ SOLN
INTRAMUSCULAR | Status: DC | PRN
  Administered 2018-12-25: 4 mg via INTRAVENOUS

## 2018-12-25 SURGICAL SUPPLY — 25 items
APPLICATOR DR MATTHEWS STRL (MISCELLANEOUS) ×3 IMPLANT
BANDAGE EYE OVAL (MISCELLANEOUS) IMPLANT
CAUTERY EYE LOW TEMP 1300F FIN (OPHTHALMIC RELATED) ×3 IMPLANT
CLOSURE WOUND 1/2 X4 (GAUZE/BANDAGES/DRESSINGS) ×1
CORDS BIPOLAR (ELECTRODE) IMPLANT
COVER BACK TABLE 60X90IN (DRAPES) ×3 IMPLANT
COVER MAYO STAND STRL (DRAPES) ×3 IMPLANT
COVER WAND RF STERILE (DRAPES) IMPLANT
DRAPE SHEET LG 3/4 BI-LAMINATE (DRAPES) ×3 IMPLANT
DRAPE SURG 17X23 STRL (DRAPES) ×9 IMPLANT
GLOVE SURG SIGNA 7.5 PF LTX (GLOVE) ×3 IMPLANT
GOWN STRL REUS W/ TWL LRG LVL3 (GOWN DISPOSABLE) ×1 IMPLANT
GOWN STRL REUS W/TWL LRG LVL3 (GOWN DISPOSABLE) ×2
KIT TURNOVER CYSTO (KITS) ×3 IMPLANT
NS IRRIG 1000ML POUR BTL (IV SOLUTION) ×3 IMPLANT
PACK BASIN DAY SURGERY FS (CUSTOM PROCEDURE TRAY) ×3 IMPLANT
SPEAR EYE SURGICAL ST (MISCELLANEOUS) IMPLANT
STRIP CLOSURE SKIN 1/2X4 (GAUZE/BANDAGES/DRESSINGS) ×2 IMPLANT
SUT MERSILENE 6 0 S14 DA (SUTURE) IMPLANT
SUT VICRYL 6 0 S 29 12 (SUTURE) ×6 IMPLANT
SUT VICRYL 7 0 TG140 8 (SUTURE) IMPLANT
SUT VICRYL 8 0 TG140 8 (SUTURE) IMPLANT
TOWEL OR 17X26 10 PK STRL BLUE (TOWEL DISPOSABLE) ×3 IMPLANT
TRAY DSU PREP LF (CUSTOM PROCEDURE TRAY) ×3 IMPLANT
WATER STERILE IRR 500ML POUR (IV SOLUTION) IMPLANT

## 2018-12-25 NOTE — Anesthesia Postprocedure Evaluation (Signed)
Anesthesia Post Note  Patient: [name]  Procedure(s) Performed: INFERIOR OBLIQUE RECESSION LEFT EYE, SUPERIOR OBLIQUE TUCK LEFT EYE (Left Eye)     Patient location during evaluation: PACU Anesthesia Type: General Level of consciousness: awake and alert and oriented Pain management: pain level controlled Vital Signs Assessment: post-procedure vital signs reviewed and stable Respiratory status: spontaneous breathing, nonlabored ventilation and respiratory function stable Cardiovascular status: blood pressure returned to baseline and stable Postop Assessment: no apparent nausea or vomiting Anesthetic complications: no    Last Vitals:  Vitals:   12/25/18 1145 12/25/18 1206  BP: (!) 141/88 (!) 146/99  Pulse: (!) 58 60  Resp: 12 12  Temp:    SpO2: 99% 100%    Last Pain:  Vitals:   12/25/18 1206  TempSrc:   PainSc: 0-No pain                 Onya Eutsler A.

## 2018-12-25 NOTE — Anesthesia Procedure Notes (Signed)
Procedure Name: LMA Insertion Date/Time: 12/25/2018 10:30 AM Performed by: Wanita Chamberlain, CRNA Pre-anesthesia Checklist: Patient identified, Emergency Drugs available, Suction available, Patient being monitored and Timeout performed Patient Re-evaluated:Patient Re-evaluated prior to induction Oxygen Delivery Method: Circle system utilized Preoxygenation: Pre-oxygenation with 100% oxygen Induction Type: IV induction Ventilation: Mask ventilation without difficulty LMA: LMA inserted LMA Size: 5.0 Number of attempts: 1 Placement Confirmation: breath sounds checked- equal and bilateral,  CO2 detector and positive ETCO2 Tube secured with: Tape Dental Injury: Teeth and Oropharynx as per pre-operative assessment

## 2018-12-25 NOTE — Transfer of Care (Signed)
Immediate Anesthesia Transfer of Care Note  Patient: [name]  Procedure(s) Performed: INFERIOR OBLIQUE RECESSION LEFT EYE, SUPERIOR OBLIQUE TUCK LEFT EYE (Left Eye)  Patient Location: PACU  Anesthesia Type:General  Level of Consciousness: awake, alert , oriented and patient cooperative  Airway & Oxygen Therapy: Patient Spontanous Breathing and Patient connected to nasal cannula oxygen  Post-op Assessment: Report given to RN and Post -op Vital signs reviewed and stable  Post vital signs: Reviewed and stable  Last Vitals:  Vitals Value Taken Time  BP    Temp    Pulse 56 12/25/18 1139  Resp 10 12/25/18 1139  SpO2 99 % 12/25/18 1139  Vitals shown include unvalidated device data.  Last Pain:  Vitals:   12/25/18 0839  TempSrc: Oral  PainSc: 0-No pain      Patients Stated Pain Goal: 4 ([id number])  Complications: No apparent anesthesia complications

## 2018-12-25 NOTE — Interval H&P Note (Signed)
History and Physical Interval Note:  12/25/2018 9:57 AM  [name]  has presented today for surgery, with the diagnosis of DOUBLE VISION, FOURTH NERVE PARESIS LEFT EYE.  The various methods of treatment have been discussed with the patient and family. After consideration of risks, benefits and other options for treatment, the patient has consented to  Procedure(s): INFERIOR OBLIQUE RECESSION LEFT EYE, SUPERIOR OBLIQUE TUCK LEFT EYE (Left) as a surgical intervention.  The patient's history has been reviewed, patient examined, no change in status, stable for surgery.  I have reviewed the patient's chart and labs.  Questions were answered to the patient's satisfaction.     Gevena Cotton

## 2018-12-25 NOTE — Op Note (Signed)
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  PROCEDURE:  Left inferior oblique recession of 8 mm, a left superior oblique tuck of 6 mm.  POSTOPERATIVE DIAGNOSIS:  Status post repair of strabismus by a left inferior oblique recession of 8 mm and a left superior oblique tuck of 6 mm.  SURGEON:  Gevena Cotton, MD  ANESTHESIA:  General with laryngeal mask airway.  INDICATION FOR PROCEDURE:  The patient is a 73 year old male with a chronic left superior oblique palsy and a compensatory head tilt with diplopia in primary position.  This procedure was indicated to restore single binocular vision in all  positions of gaze and to ablate the right head tilt.  The risks and benefits of the procedure were explained to the patient.  Prior to procedure, informed consent was obtained.  DESCRIPTION OF TECHNIQUE:  The patient was taken into the operating room, placed in the supine position.  The entire face was prepped and draped in the usual sterile fashion.  My attention was first directed to the left eye.  The globe was held in the superior temporal quadrant.  The eye was depressed and adducted.  An incision was made through the superior temporal fornix, taken down to the posterior sub-Tenons space, and the left superior rectus tendon was then isolated on a Stevens hook,  subsequently on a green hook.  This was used to hold the globe in an adducted and depressed position.  Next, the superior oblique tendon was isolated under direct visualization.  The fibers were sequestered on 2 Stevens hooks, coursing inferior to the superior rectus tendon.  They were then passed to a green hook, and the overlying intermuscular septum was dissected free from  the tendon.  Next, a mark was then placed on the tendon at 6 mm from its insertion.  The tendon was then imbricated on 6-0 Vicryl suture at the preplaced mark.  Taking two locking bites at the medial temporal apices, the imbricated tendon was then advanced to its insertion site, completing the resection of 6 mm.  Sutures were then tied securely and the conjunctiva was repositioned.  My attention was then directed to the ipsilateral inferior oblique tendon.  The globe was held in inferior temporal quadrant.  The eye was elevated and adducted.  An incision was made through the inferior temporal fornix and taken down to the posterior sub-Tenons space, and the left lateral rectus tendon was then isolated on a Stevens hook, subsequently a green hook, and this was used to hold the globe in an elevated and adducted position.  Next, a small retractor was placed in the incision site, and 2 Stevens hooks were used to identify and sequester the inferior oblique tendon coursing from its origin in the inferior anterior aspect of the globe to its insertion at the posterior inferior temporal quadrant of the globe.  Next, the tendon was then passed to a green hook, and it was dissected free from its overlying muscle fascia, and intermuscular septae were transected.  The tendon was then imbricated on 6-0 Vicryl suture at its insertion site and detached from the globe and recessed to a position 6 mm posterior to the ipsilateral inferior rectus tendon and 4 mm lateral to the same tendon, completing an 8 mm recession.  The tendon was then reattached to  the globe in this position.  The conjunctiva was repositioned.  At the  conclusion of the procedure, TobraDex ointment was instilled in inferior fornices of the left eye.  There were no apparent complications.  LN/NUANCE  D:12/25/2018 T:12/25/2018 JOB:[id number]

## 2018-12-25 NOTE — Brief Op Note (Signed)
12/25/2018  11:38 AM  PATIENT:  [name]  73 y.o. male  PRE-OPERATIVE DIAGNOSIS:  DOUBLE VISION, FOURTH NERVE PARESIS LEFT EYE  POST-OPERATIVE DIAGNOSIS:  DOUBLE VISION, FOURTH NERVE PARESIS LEFT EYE  PROCEDURE:  Procedure(s): INFERIOR OBLIQUE RECESSION LEFT EYE, SUPERIOR OBLIQUE TUCK LEFT EYE (Left)  SURGEON:  Surgeon(s) and Role:    Gevena Cotton, MD - Primary  PHYSICIAN ASSISTANT:   ASSISTANTS: none   ANESTHESIA:   none  EBL:  none   BLOOD ADMINISTERED:none  DRAINS: none   LOCAL MEDICATIONS USED:  NONE  SPECIMEN:  No Specimen  DISPOSITION OF SPECIMEN:  N/A  COUNTS:  YES  TOURNIQUET:  * No tourniquets in log *  DICTATION: .Other Dictation: Dictation Number [id number]  PLAN OF CARE: Discharge to home after PACU  PATIENT DISPOSITION:  PACU - hemodynamically stable.   Delay start of Pharmacological VTE agent (>24hrs) due to surgical blood loss or risk of bleeding: no

## 2018-12-25 NOTE — Anesthesia Preprocedure Evaluation (Addendum)
Anesthesia Evaluation  Patient identified by MRN, date of birth, ID band Patient awake    Reviewed: Allergy & Precautions, NPO status , Patient's Chart, lab work & pertinent test results  Airway Mallampati: II  TM Distance: >3 FB Neck ROM: Full    Dental  (+) Teeth Intact   Pulmonary    Pulmonary exam normal breath sounds clear to auscultation       Cardiovascular hypertension, Pt. on medications Normal cardiovascular exam Rhythm:Regular Rate:Normal     Neuro/Psych PSYCHIATRIC DISORDERS Anxiety Depression Schizophrenia PTSDLeft IV CN paresis    GI/Hepatic negative GI ROS, Neg liver ROS,   Endo/Other  Obesity  Renal/GU negative Renal ROS   ED    Musculoskeletal Exotropia OS   Abdominal (+) + obese,   Peds  Hematology negative hematology ROS (+)   Anesthesia Other Findings   Reproductive/Obstetrics                             Anesthesia Physical Anesthesia Plan  ASA: II  Anesthesia Plan: General   Post-op Pain Management:    Induction: Intravenous  PONV Risk Score and Plan: 4 or greater and Ondansetron, Treatment may vary due to age or medical condition and Midazolam  Airway Management Planned: LMA  Additional Equipment:   Intra-op Plan:   Post-operative Plan: Extubation in OR  Informed Consent: I have reviewed the patients History and Physical, chart, labs and discussed the procedure including the risks, benefits and alternatives for the proposed anesthesia with the patient or authorized representative who has indicated his/her understanding and acceptance.       Plan Discussed with: CRNA, Surgeon and Anesthesiologist  Anesthesia Plan Comments:        Anesthesia Quick Evaluation

## 2018-12-25 NOTE — Discharge Instructions (Signed)
Advance to PO as tolerated. Cool compresses left eye Q 15 mins as tolerated. D/C IVF when PO intake adequate. D/C to home when VSS .  Tobradex to eft eye twice a day Call your surgeon if you experience:   1.  Fever over 101.0. 2..  Nausea and/or vomiting. 3..  Extreme swelling or bruising at the surgical site. 4..  Continued bleeding from the incision. 5.  Increased pain, redness or drainage from the incision. 6.  Problems related to your pain medication. 7.  Any problems and/or concerns  Post Anesthesia Home Care Instructions  Activity: Get plenty of rest for the remainder of the day. A responsible individual must stay with you for 24 hours following the procedure.  For the next 24 hours, DO NOT: -Drive a car -Paediatric nurse -Drink alcoholic beverages -Take any medication unless instructed by your physician -Make any legal decisions or sign important papers.  Meals: Start with liquid foods such as gelatin or soup. Progress to regular foods as tolerated. Avoid greasy, spicy, heavy foods. If nausea and/or vomiting occur, drink only clear liquids until the nausea and/or vomiting subsides. Call your physician if vomiting continues.  Special Instructions/Symptoms: Your throat may feel dry or sore from the anesthesia or the breathing tube placed in your throat during surgery. If this causes discomfort, gargle with warm salt water. The discomfort should disappear within 24 hours.  If you had a scopolamine patch placed behind your ear for the management of post- operative nausea and/or vomiting:  1. The medication in the patch is effective for 72 hours, after which it should be removed.  Wrap patch in a tissue and discard in the trash. Wash hands thoroughly with soap and water. 2. You may remove the patch earlier than 72 hours if you experience unpleasant side effects which may include dry mouth, dizziness or visual disturbances. 3. Avoid touching the patch. Wash your hands with soap  and water after contact with the patch.

## 2018-12-26 ENCOUNTER — Encounter (HOSPITAL_BASED_OUTPATIENT_CLINIC_OR_DEPARTMENT_OTHER): Payer: Self-pay | Admitting: Ophthalmology

## 2019-01-16 ENCOUNTER — Other Ambulatory Visit (INDEPENDENT_AMBULATORY_CARE_PROVIDER_SITE_OTHER): Payer: BC Managed Care – PPO

## 2019-01-16 ENCOUNTER — Encounter: Payer: Self-pay | Admitting: Internal Medicine

## 2019-01-16 ENCOUNTER — Other Ambulatory Visit: Payer: Self-pay

## 2019-01-16 ENCOUNTER — Ambulatory Visit (INDEPENDENT_AMBULATORY_CARE_PROVIDER_SITE_OTHER): Payer: BC Managed Care – PPO | Admitting: Internal Medicine

## 2019-01-16 ENCOUNTER — Other Ambulatory Visit: Payer: Self-pay | Admitting: Internal Medicine

## 2019-01-16 VITALS — BP 124/76 | HR 64 | Temp 97.8°F | Ht 67.0 in | Wt 206.0 lb

## 2019-01-16 DIAGNOSIS — E538 Deficiency of other specified B group vitamins: Secondary | ICD-10-CM

## 2019-01-16 DIAGNOSIS — E611 Iron deficiency: Secondary | ICD-10-CM

## 2019-01-16 DIAGNOSIS — E559 Vitamin D deficiency, unspecified: Secondary | ICD-10-CM

## 2019-01-16 DIAGNOSIS — F329 Major depressive disorder, single episode, unspecified: Secondary | ICD-10-CM

## 2019-01-16 DIAGNOSIS — Z0000 Encounter for general adult medical examination without abnormal findings: Secondary | ICD-10-CM

## 2019-01-16 DIAGNOSIS — I10 Essential (primary) hypertension: Secondary | ICD-10-CM

## 2019-01-16 DIAGNOSIS — F32A Depression, unspecified: Secondary | ICD-10-CM

## 2019-01-16 LAB — CBC WITH DIFFERENTIAL/PLATELET
Basophils Absolute: 0 10*3/uL (ref 0.0–0.1)
Basophils Relative: 0.9 % (ref 0.0–3.0)
Eosinophils Absolute: 0 10*3/uL (ref 0.0–0.7)
Eosinophils Relative: 1 % (ref 0.0–5.0)
HCT: 41.1 % (ref 39.0–52.0)
Hemoglobin: 13.1 g/dL (ref 13.0–17.0)
Lymphocytes Relative: 34.8 % (ref 12.0–46.0)
Lymphs Abs: 1.1 10*3/uL (ref 0.7–4.0)
MCHC: 31.9 g/dL (ref 30.0–36.0)
MCV: 88.9 fl (ref 78.0–100.0)
Monocytes Absolute: 0.3 10*3/uL (ref 0.1–1.0)
Monocytes Relative: 10.4 % (ref 3.0–12.0)
Neutro Abs: 1.7 10*3/uL (ref 1.4–7.7)
Neutrophils Relative %: 52.9 % (ref 43.0–77.0)
Platelets: 122 10*3/uL — ABNORMAL LOW (ref 150.0–400.0)
RBC: 4.62 Mil/uL (ref 4.22–5.81)
RDW: 14.7 % (ref 11.5–15.5)
WBC: 3.2 10*3/uL — ABNORMAL LOW (ref 4.0–10.5)

## 2019-01-16 LAB — BASIC METABOLIC PANEL
BUN: 12 mg/dL (ref 6–23)
CO2: 29 mEq/L (ref 19–32)
Calcium: 9.4 mg/dL (ref 8.4–10.5)
Chloride: 105 mEq/L (ref 96–112)
Creatinine, Ser: 1.22 mg/dL (ref 0.40–1.50)
GFR: 70.24 mL/min (ref >60.00)
Glucose, Bld: 88 mg/dL (ref 70–99)
Potassium: 4.7 mEq/L (ref 3.5–5.1)
Sodium: 141 mEq/L (ref 135–145)

## 2019-01-16 LAB — LIPID PANEL
Cholesterol: 154 mg/dL (ref 0–200)
HDL: 60.3 mg/dL (ref >39.00)
LDL Cholesterol: 85 mg/dL (ref 0–99)
NonHDL: 93.81
Total CHOL/HDL Ratio: 3
Triglycerides: 42 mg/dL (ref 0.0–149.0)
VLDL: 8.4 mg/dL (ref 0.0–40.0)

## 2019-01-16 LAB — URINALYSIS, ROUTINE W REFLEX MICROSCOPIC
Bilirubin Urine: NEGATIVE
Hgb urine dipstick: NEGATIVE
Ketones, ur: NEGATIVE
Leukocytes,Ua: NEGATIVE
Nitrite: NEGATIVE
RBC / HPF: NONE SEEN (ref 0–?)
Specific Gravity, Urine: >=1.03 — AB (ref 1.000–1.030)
Urine Glucose: NEGATIVE
Urobilinogen, UA: 0.2 (ref 0.0–1.0)
pH: 5.5 (ref 5.0–8.0)

## 2019-01-16 LAB — HEPATIC FUNCTION PANEL
ALT: 15 U/L (ref 0–53)
AST: 18 U/L (ref 0–37)
Albumin: 4.2 g/dL (ref 3.5–5.2)
Alkaline Phosphatase: 63 U/L (ref 39–117)
Bilirubin, Direct: 0.2 mg/dL (ref 0.0–0.3)
Total Bilirubin: 0.9 mg/dL (ref 0.2–1.2)
Total Protein: 7.1 g/dL (ref 6.0–8.3)

## 2019-01-16 LAB — IBC PANEL
Iron: 159 ug/dL (ref 42–165)
Saturation Ratios: 53.3 % — ABNORMAL HIGH (ref 20.0–50.0)
Transferrin: 213 mg/dL (ref 212.0–360.0)

## 2019-01-16 LAB — TSH: TSH: 1.48 u[IU]/mL (ref 0.35–4.50)

## 2019-01-16 LAB — PSA: PSA: 0.85 ng/mL (ref 0.10–4.00)

## 2019-01-16 LAB — VITAMIN B12: Vitamin B-12: 312 pg/mL (ref 211–911)

## 2019-01-16 LAB — VITAMIN D 25 HYDROXY (VIT D DEFICIENCY, FRACTURES): VITD: 21 ng/mL — ABNORMAL LOW (ref 30.00–100.00)

## 2019-01-16 MED ORDER — CITALOPRAM HYDROBROMIDE 10 MG PO TABS: 10.0000 mg | ORAL_TABLET | Freq: Every day | ORAL | 3 refills | Status: DC

## 2019-01-16 MED ORDER — VITAMIN D (ERGOCALCIFEROL) 1.25 MG (50000 UNIT) PO CAPS: 50000.0000 [IU] | ORAL_CAPSULE | ORAL | 0 refills | Status: DC

## 2019-01-16 MED ORDER — NIFEDIPINE ER OSMOTIC RELEASE 30 MG PO TB24: 30.0000 mg | ORAL_TABLET | Freq: Every day | ORAL | 3 refills | Status: DC

## 2019-01-16 MED ORDER — LOSARTAN POTASSIUM 100 MG PO TABS: 100.0000 mg | ORAL_TABLET | Freq: Every day | ORAL | 3 refills | Status: DC

## 2019-01-16 NOTE — Patient Instructions (Signed)
Please take all new medication as prescribed - the celexa 10 mg per day  Please continue all other medications as before, and refills have been done if requested.  Please have the pharmacy call with any other refills you may need.  Please continue your efforts at being more active, low cholesterol diet, and weight control.  You are otherwise up to date with prevention measures today.  Please keep your appointments with your specialists as you may have planned  Please go to the LAB in the Basement (turn left off the elevator) for the tests to be done today  You will be contacted by phone if any changes need to be made immediately.  Otherwise, you will receive a letter about your results with an explanation, but please check with MyChart first.  Please remember to sign up for MyChart if you have not done so, as this will be important to you in the future with finding out test results, communicating by private email, and scheduling acute appointments online when needed.  Please return in 6 months, or sooner if needed

## 2019-01-16 NOTE — Progress Notes (Signed)
Subjective:    Patient ID: [name], male    DOB: [birth year], 73 y.o.   MRN: [id number]  HPI  Here for wellness and f/u;  Overall doing ok;  Pt denies Chest pain, worsening SOB, DOE, wheezing, orthopnea, PND, worsening LE edema, palpitations, dizziness or syncope.  Pt denies neurological change such as new headache, facial or extremity weakness.  Pt denies polydipsia, polyuria, or low sugar symptoms. Pt states overall good compliance with treatment and medications, good tolerability, and has been trying to follow appropriate diet. No fever, night sweats, wt loss, loss of appetite, or other constitutional symptoms.  Pt states good ability with ADL's, has low fall risk, home safety reviewed and adequate, no other significant changes in hearing or vision, and only occasionally active with exercise. Wt Readings from Last 3 Encounters:  01/16/19 206 lb (93.4 kg)  12/25/18 206 lb (93.4 kg)  10/17/18 220 lb (99.8 kg)   BP Readings from Last 3 Encounters:  01/16/19 124/76  12/25/18 132/87  10/17/18 (!) 150/88  also, Here with 2 wks worsening daily depressive symptoms, feeling down, not looking forward, no enjoyment of usual activities but without Si or HI, and has ongoing mild anxiety as well, crying spells, and multiple social stressors asking for med tx  Past Medical History:  Diagnosis Date  . Depression 12/30/2013  . Hypertension   . Personal history of colonic polyps 12/30/2013   Per colonoscopy at [facility] per pt, about sept 2014  . PTSD (post-traumatic stress disorder)    possible [place] vet  . Tubular adenoma of colon 2014   Past Surgical History:  Procedure Laterality Date  . COLONOSCOPY    . MUSCLE RECESSION AND RESECTION Left 12/25/2018   Procedure: INFERIOR OBLIQUE RECESSION LEFT EYE, SUPERIOR OBLIQUE TUCK LEFT EYE;  Surgeon: Gevena Cotton, MD;  Location: [facility];  Service: Ophthalmology;  Laterality: Left;  . UPPER GASTROINTESTINAL ENDOSCOPY      reports  that he has never smoked. He has never used smokeless tobacco. He reports that he does not drink alcohol or use drugs. family history includes Hypertension in an other family member. Allergies  Allergen Reactions  . Lisinopril     Angioedema? Patient reports that his tongue and mouth became very swollen, was seen at [facility] for this in 2014.    Current Outpatient Medications on File Prior to Visit  Medication Sig Dispense Refill  . polyethylene glycol powder (GLYCOLAX/MIRALAX) 17 GM/SCOOP powder Take 17 g by mouth 2 (two) times daily as needed. (Patient taking differently: Take 17 g by mouth as directed. 1/2 to 2 scoops daily to achieve 1-2 complete bowel movements.) 3350 g 11  . sildenafil (VIAGRA) 100 MG tablet Take 0.5-1 tablets (50-100 mg total) by mouth daily as needed for erectile dysfunction. 5 tablet 11  . tadalafil (CIALIS) 20 MG tablet Take 1 tablet (20 mg total) by mouth daily as needed for erectile dysfunction. 10 tablet 11  . tobramycin-dexamethasone (TOBRADEX) ophthalmic ointment Place 1 application into the left eye 2 (two) times daily at 10 am and 4 pm. 3.5 g 0   No current facility-administered medications on file prior to visit.    Review of Systems Constitutional: Negative for other unusual diaphoresis, sweats, appetite or weight changes HENT: Negative for other worsening hearing loss, ear pain, facial swelling, mouth sores or neck stiffness.   Eyes: Negative for other worsening pain, redness or other visual disturbance.  Respiratory: Negative for other stridor or swelling Cardiovascular: Negative for other  palpitations or other chest pain  Gastrointestinal: Negative for worsening diarrhea or loose stools, blood in stool, distention or other pain Genitourinary: Negative for hematuria, flank pain or other change in urine volume.  Musculoskeletal: Negative for myalgias or other joint swelling.  Skin: Negative for other color change, or other wound or worsening drainage.   Neurological: Negative for other syncope or numbness. Hematological: Negative for other adenopathy or swelling Psychiatric/Behavioral: Negative for hallucinations, other worsening agitation, SI, self-injury, or new decreased concentration All otherwise neg per pt     Objective:   Physical Exam BP 124/76   Pulse 64   Temp 97.8 F (36.6 C) (Oral)   Ht 5\' 7"  (1.702 m)   Wt 206 lb (93.4 kg)   SpO2 97%   BMI 32.26 kg/m  VS noted,  Constitutional: Pt is oriented to person, place, and time. Appears well-developed and well-nourished, in no significant distress and comfortable Head: Normocephalic and atraumatic  Eyes: Conjunctivae and EOM are normal. Pupils are equal, round, and reactive to light Right Ear: External ear normal without discharge Left Ear: External ear normal without discharge Nose: Nose without discharge or deformity Mouth/Throat: Oropharynx is without other ulcerations and moist  Neck: Normal range of motion. Neck supple. No JVD present. No tracheal deviation present or significant neck LA or mass Cardiovascular: Normal rate, regular rhythm, normal heart sounds and intact distal pulses.   Pulmonary/Chest: WOB normal and breath sounds without rales or wheezing  Abdominal: Soft. Bowel sounds are normal. NT. No HSM  Musculoskeletal: Normal range of motion. Exhibits no edema Lymphadenopathy: Has no other cervical adenopathy.  Neurological: Pt is alert and oriented to person, place, and time. Pt has normal reflexes. No cranial nerve deficit. Motor grossly intact, Gait intact Skin: Skin is warm and dry. No rash noted or new ulcerations Psychiatric:  Has normal mood and affect. Behavior is normal without agitation All otherwise neg per pt  Lab Results  Component Value Date   WBC 3.7 (L) 11/26/2018   HGB 13.3 11/26/2018   HCT 40.6 11/26/2018   PLT 118.0 (L) 11/26/2018   GLUCOSE 93 01/11/2018   CHOL 125 01/11/2018   TRIG 58.0 01/11/2018   HDL 43.90 01/11/2018   LDLCALC  69 01/11/2018   ALT 17 01/11/2018   AST 17 01/11/2018   NA 139 01/11/2018   K 4.4 01/11/2018   CL 103 01/11/2018   CREATININE 1.37 01/11/2018   BUN 10 01/11/2018   CO2 32 01/11/2018   TSH 0.53 01/11/2018   PSA 0.86 01/11/2018      Assessment & Plan:

## 2019-01-16 NOTE — Assessment & Plan Note (Signed)

## 2019-01-16 NOTE — Assessment & Plan Note (Signed)
stable overall by history and exam, recent data reviewed with pt, and pt to continue medical treatment as before,  to f/u any worsening symptoms or concerns  

## 2019-01-16 NOTE — Assessment & Plan Note (Addendum)
Mild to mod, for celexa 10 qd, to f/u any worsening symptoms or concerns  In addition to the time spent performing CPE, I spent an additional 15 minutes face to face,in which greater than 50% of this time was spent in counseling and coordination of care for patient's illness as documented, including the differential dx, treatment, further evaluation and other management of depression, HTN

## 2019-01-17 ENCOUNTER — Telehealth: Payer: Self-pay

## 2019-01-17 NOTE — Telephone Encounter (Signed)
Pt has been informed of results and expressed understanding.  °

## 2019-01-17 NOTE — Telephone Encounter (Signed)
-----   Message from Biagio Borg, MD sent at 01/16/2019 12:29 PM EDT ----- Letter sent, cont same tx except  The test results show that your current treatment is OK, as even the platelets are stable, except there is low Vitamin D level.  Please take Vitamin D 50000 units weekly for 12 weeks, then plan to change to OTC Vitamin D3 at 2000 units per day, indefinitely.Redmond Baseman to please inform pt, I will do rx

## 2019-01-17 NOTE — Telephone Encounter (Signed)
Called pt, LVM.   

## 2019-07-17 ENCOUNTER — Ambulatory Visit: Payer: BC Managed Care – PPO | Admitting: Internal Medicine

## 2019-07-17 DIAGNOSIS — Z0289 Encounter for other administrative examinations: Secondary | ICD-10-CM

## 2019-08-05 ENCOUNTER — Ambulatory Visit: Payer: BC Managed Care – PPO | Admitting: Internal Medicine

## 2019-08-06 ENCOUNTER — Ambulatory Visit: Payer: BC Managed Care – PPO | Admitting: Internal Medicine

## 2019-08-06 DIAGNOSIS — Z0289 Encounter for other administrative examinations: Secondary | ICD-10-CM

## 2019-08-07 ENCOUNTER — Other Ambulatory Visit: Payer: Self-pay

## 2019-08-07 ENCOUNTER — Encounter: Payer: Self-pay | Admitting: Internal Medicine

## 2019-08-07 ENCOUNTER — Ambulatory Visit: Payer: BC Managed Care – PPO | Admitting: Internal Medicine

## 2019-08-07 VITALS — BP 140/86 | HR 61 | Temp 98.8°F | Ht 67.0 in | Wt 189.0 lb

## 2019-08-07 DIAGNOSIS — F329 Major depressive disorder, single episode, unspecified: Secondary | ICD-10-CM

## 2019-08-07 DIAGNOSIS — E559 Vitamin D deficiency, unspecified: Secondary | ICD-10-CM

## 2019-08-07 DIAGNOSIS — I10 Essential (primary) hypertension: Secondary | ICD-10-CM

## 2019-08-07 DIAGNOSIS — F32A Depression, unspecified: Secondary | ICD-10-CM

## 2019-08-07 DIAGNOSIS — K5909 Other constipation: Secondary | ICD-10-CM | POA: Diagnosis not present

## 2019-08-07 DIAGNOSIS — Z0000 Encounter for general adult medical examination without abnormal findings: Secondary | ICD-10-CM

## 2019-08-07 HISTORY — DX: Vitamin D deficiency, unspecified: E55.9

## 2019-08-07 NOTE — Progress Notes (Signed)
Subjective:    Patient ID: [name], male    DOB: [birth year], 74 y.o.   MRN: [id number]  HPI   Here to f/u; overall doing ok,  Pt denies chest pain, increasing sob or doe, wheezing, orthopnea, PND, increased LE swelling, palpitations, dizziness or syncope.  Pt denies new neurological symptoms such as new headache, or facial or extremity weakness or numbness.  Pt denies polydipsia, polyuria, or low sugar episode.  Pt states overall good compliance with meds, mostly trying to follow appropriate diet, with wt overall stable,  but little exercise however.  Denies worsening reflux, abd pain, dysphagia, n/v, bowel change or blood.  Denies worsening depressive symptoms, suicidal ideation, or panic; has ongoing anxiety, not increased recently.    Past Medical History:  Diagnosis Date  . Depression 12/30/2013  . Hypertension   . Personal history of colonic polyps 12/30/2013   Per colonoscopy at [facility] per pt, about sept 2014  . PTSD (post-traumatic stress disorder)    possible [place] vet  . Tubular adenoma of colon 2014   Past Surgical History:  Procedure Laterality Date  . COLONOSCOPY    . MUSCLE RECESSION AND RESECTION Left 12/25/2018   Procedure: INFERIOR OBLIQUE RECESSION LEFT EYE, SUPERIOR OBLIQUE TUCK LEFT EYE;  Surgeon: Gevena Cotton, MD;  Location: [facility];  Service: Ophthalmology;  Laterality: Left;  . UPPER GASTROINTESTINAL ENDOSCOPY      reports that he has never smoked. He has never used smokeless tobacco. He reports that he does not drink alcohol or use drugs. family history includes Hypertension in an other family member. Allergies  Allergen Reactions  . Lisinopril     Angioedema? Patient reports that his tongue and mouth became very swollen, was seen at [facility] for this in 2014.    Current Outpatient Medications on File Prior to Visit  Medication Sig Dispense Refill  . citalopram (CELEXA) 10 MG tablet Take 1 tablet (10 mg total) by mouth daily. 90 tablet 3  .  ketorolac (ACULAR) 0.5 % ophthalmic solution 1 drop 2 (two) times daily.    Marland Kitchen losartan (COZAAR) 100 MG tablet Take 1 tablet (100 mg total) by mouth daily. 90 tablet 3  . NIFEdipine (PROCARDIA-XL/NIFEDICAL-XL) 30 MG 24 hr tablet Take 1 tablet (30 mg total) by mouth daily. 90 tablet 3  . polyethylene glycol powder (GLYCOLAX/MIRALAX) 17 GM/SCOOP powder Take 17 g by mouth 2 (two) times daily as needed. (Patient taking differently: Take 17 g by mouth as directed. 1/2 to 2 scoops daily to achieve 1-2 complete bowel movements.) 3350 g 11  . sildenafil (VIAGRA) 100 MG tablet Take 0.5-1 tablets (50-100 mg total) by mouth daily as needed for erectile dysfunction. 5 tablet 11  . tadalafil (CIALIS) 20 MG tablet Take 1 tablet (20 mg total) by mouth daily as needed for erectile dysfunction. 10 tablet 11  . tobramycin-dexamethasone (TOBRADEX) ophthalmic ointment Place 1 application into the left eye 2 (two) times daily at 10 am and 4 pm. 3.5 g 0  . Vitamin D, Ergocalciferol, (DRISDOL) 1.25 MG (50000 UT) CAPS capsule Take 1 capsule (50,000 Units total) by mouth every 7 (seven) days. 12 capsule 0   No current facility-administered medications on file prior to visit.   Review of Systems All otherwise neg per pt     Objective:   Physical Exam BP 140/86 (BP Location: Left Arm, Patient Position: Sitting, Cuff Size: Large)   Pulse 61   Temp 98.8 F (37.1 C) (Oral)   Ht 5'  7" (1.702 m)   Wt 189 lb (85.7 kg)   SpO2 98%   BMI 29.60 kg/m  VS noted,  Constitutional: Pt appears in NAD HENT: Head: NCAT.  Right Ear: External ear normal.  Left Ear: External ear normal.  Eyes: . Pupils are equal, round, and reactive to light. Conjunctivae and EOM are normal Nose: without d/c or deformity Neck: Neck supple. Gross normal ROM Cardiovascular: Normal rate and regular rhythm.   Pulmonary/Chest: Effort normal and breath sounds without rales or wheezing.  Abd:  Soft, NT, ND, + BS, no organomegaly Neurological: Pt is  alert. At baseline orientation, motor grossly intact Skin: Skin is warm. No rashes, other new lesions, no LE edema Psychiatric: Pt behavior is normal without agitation  All otherwise neg per pt Lab Results  Component Value Date   WBC 3.2 (L) 01/16/2019   HGB 13.1 01/16/2019   HCT 41.1 01/16/2019   PLT 122.0 (L) 01/16/2019   GLUCOSE 88 01/16/2019   CHOL 154 01/16/2019   TRIG 42.0 01/16/2019   HDL 60.30 01/16/2019   LDLCALC 85 01/16/2019   ALT 15 01/16/2019   AST 18 01/16/2019   NA 141 01/16/2019   K 4.7 01/16/2019   CL 105 01/16/2019   CREATININE 1.22 01/16/2019   BUN 12 01/16/2019   CO2 29 01/16/2019   TSH 1.48 01/16/2019   PSA 0.85 01/16/2019         Assessment & Plan:

## 2019-08-07 NOTE — Patient Instructions (Signed)

## 2019-08-09 ENCOUNTER — Encounter: Payer: Self-pay | Admitting: Internal Medicine

## 2019-08-09 NOTE — Assessment & Plan Note (Signed)
stable overall by history and exam, recent data reviewed with pt, and pt to continue medical treatment as before,  to f/u any worsening symptoms or concerns  

## 2019-08-09 NOTE — Assessment & Plan Note (Addendum)
stable overall by history and exam, recent data reviewed with pt, and pt to continue medical treatment as before,  to f/u any worsening symptoms or concerns  I spent 32 minutes in preparing to see the patient by review of recent labs, imaging and procedures, obtaining and reviewing separately obtained history, communicating with the patient and family or caregiver, ordering medications, tests or procedures, and documenting clinical information in the EHR including the differential Dx, treatment, and any further evaluation and other management of chronic constipation, depression, hTN, vit d def

## 2019-08-09 NOTE — Assessment & Plan Note (Signed)
For oral replacement 

## 2019-09-30 ENCOUNTER — Other Ambulatory Visit (INDEPENDENT_AMBULATORY_CARE_PROVIDER_SITE_OTHER): Payer: BC Managed Care – PPO

## 2019-09-30 DIAGNOSIS — Z125 Encounter for screening for malignant neoplasm of prostate: Secondary | ICD-10-CM | POA: Diagnosis not present

## 2019-09-30 DIAGNOSIS — Z0000 Encounter for general adult medical examination without abnormal findings: Secondary | ICD-10-CM | POA: Diagnosis not present

## 2019-09-30 DIAGNOSIS — E559 Vitamin D deficiency, unspecified: Secondary | ICD-10-CM | POA: Diagnosis not present

## 2019-09-30 LAB — HEPATIC FUNCTION PANEL
ALT: 16 U/L (ref 0–53)
AST: 22 U/L (ref 0–37)
Albumin: 4.2 g/dL (ref 3.5–5.2)
Alkaline Phosphatase: 66 U/L (ref 39–117)
Bilirubin, Direct: 0.2 mg/dL (ref 0.0–0.3)
Total Bilirubin: 0.6 mg/dL (ref 0.2–1.2)
Total Protein: 6.9 g/dL (ref 6.0–8.3)

## 2019-09-30 LAB — CBC WITH DIFFERENTIAL/PLATELET
Basophils Absolute: 0 10*3/uL (ref 0.0–0.1)
Basophils Relative: 1 % (ref 0.0–3.0)
Eosinophils Absolute: 0 10*3/uL (ref 0.0–0.7)
Eosinophils Relative: 0.6 % (ref 0.0–5.0)
HCT: 38.3 % — ABNORMAL LOW (ref 39.0–52.0)
Hemoglobin: 12.6 g/dL — ABNORMAL LOW (ref 13.0–17.0)
Lymphocytes Relative: 38.7 % (ref 12.0–46.0)
Lymphs Abs: 1.2 10*3/uL (ref 0.7–4.0)
MCHC: 33 g/dL (ref 30.0–36.0)
MCV: 89.9 fl (ref 78.0–100.0)
Monocytes Absolute: 0.3 10*3/uL (ref 0.1–1.0)
Monocytes Relative: 9.1 % (ref 3.0–12.0)
Neutro Abs: 1.6 10*3/uL (ref 1.4–7.7)
Neutrophils Relative %: 50.6 % (ref 43.0–77.0)
Platelets: 119 10*3/uL — ABNORMAL LOW (ref 150.0–400.0)
RBC: 4.27 Mil/uL (ref 4.22–5.81)
RDW: 14.2 % (ref 11.5–15.5)
WBC: 3.1 10*3/uL — ABNORMAL LOW (ref 4.0–10.5)

## 2019-09-30 LAB — BASIC METABOLIC PANEL
BUN: 13 mg/dL (ref 6–23)
CO2: 31 mEq/L (ref 19–32)
Calcium: 9.2 mg/dL (ref 8.4–10.5)
Chloride: 106 mEq/L (ref 96–112)
Creatinine, Ser: 1.22 mg/dL (ref 0.40–1.50)
GFR: 70.11 mL/min (ref >60.00)
Glucose, Bld: 94 mg/dL (ref 70–99)
Potassium: 3.8 mEq/L (ref 3.5–5.1)
Sodium: 141 mEq/L (ref 135–145)

## 2019-09-30 LAB — LIPID PANEL
Cholesterol: 131 mg/dL (ref 0–200)
HDL: 50.3 mg/dL (ref >39.00)
LDL Cholesterol: 71 mg/dL (ref 0–99)
NonHDL: 80.27
Total CHOL/HDL Ratio: 3
Triglycerides: 46 mg/dL (ref 0.0–149.0)
VLDL: 9.2 mg/dL (ref 0.0–40.0)

## 2019-09-30 LAB — PSA: PSA: 1.43 ng/mL (ref 0.10–4.00)

## 2019-09-30 LAB — VITAMIN D 25 HYDROXY (VIT D DEFICIENCY, FRACTURES): VITD: 53.7 ng/mL (ref 30.00–100.00)

## 2019-09-30 LAB — TSH: TSH: 1.15 u[IU]/mL (ref 0.35–4.50)

## 2019-10-03 ENCOUNTER — Telehealth: Payer: Self-pay | Admitting: Internal Medicine

## 2019-10-03 ENCOUNTER — Telehealth: Payer: Self-pay

## 2019-10-03 NOTE — Telephone Encounter (Signed)
Spoke with pt and sent Dr. Jenny Reichmann a message on the pts concerns with his labs. Awaiting Dr. Judi Cong advice.

## 2019-10-03 NOTE — Telephone Encounter (Addendum)
    Please call patient to discuss lab results Please call [phone]

## 2019-10-03 NOTE — Telephone Encounter (Signed)
Very sorry, I cant do complete explanations of lab results by email  Cbc is stable and no significant change from previous and can be addressed at next OV

## 2019-10-06 NOTE — Telephone Encounter (Signed)
Spoke with [name] and informed him of Dr. Jenny Reichmann notes. [name] has stated he would like to come in to discuss his concerns about his platelets being low.  Scheduled [name] to come in on 10/10/2019 @ 10am.

## 2019-10-06 NOTE — Telephone Encounter (Signed)
New message   Wife calling on Friday , June 18 th messages asking for a call back to discuss.

## 2019-10-10 ENCOUNTER — Telehealth: Payer: Self-pay | Admitting: Internal Medicine

## 2019-10-10 ENCOUNTER — Encounter: Payer: Self-pay | Admitting: Internal Medicine

## 2019-10-10 ENCOUNTER — Ambulatory Visit (INDEPENDENT_AMBULATORY_CARE_PROVIDER_SITE_OTHER): Payer: BC Managed Care – PPO | Admitting: Internal Medicine

## 2019-10-10 ENCOUNTER — Other Ambulatory Visit: Payer: Self-pay

## 2019-10-10 VITALS — BP 120/80 | HR 62 | Temp 98.9°F | Ht 67.0 in | Wt 200.0 lb

## 2019-10-10 DIAGNOSIS — D72829 Elevated white blood cell count, unspecified: Secondary | ICD-10-CM

## 2019-10-10 DIAGNOSIS — E559 Vitamin D deficiency, unspecified: Secondary | ICD-10-CM

## 2019-10-10 DIAGNOSIS — F329 Major depressive disorder, single episode, unspecified: Secondary | ICD-10-CM | POA: Diagnosis not present

## 2019-10-10 DIAGNOSIS — D696 Thrombocytopenia, unspecified: Secondary | ICD-10-CM

## 2019-10-10 DIAGNOSIS — I10 Essential (primary) hypertension: Secondary | ICD-10-CM

## 2019-10-10 DIAGNOSIS — F32A Depression, unspecified: Secondary | ICD-10-CM

## 2019-10-10 HISTORY — DX: Thrombocytopenia, unspecified: D69.6

## 2019-10-10 HISTORY — DX: Elevated white blood cell count, unspecified: D72.829

## 2019-10-10 MED ORDER — CITALOPRAM HYDROBROMIDE 10 MG PO TABS: 10.0000 mg | ORAL_TABLET | Freq: Every day | ORAL | 3 refills | Status: DC

## 2019-10-10 MED ORDER — LOSARTAN POTASSIUM 100 MG PO TABS: 100.0000 mg | ORAL_TABLET | Freq: Every day | ORAL | 3 refills | Status: DC

## 2019-10-10 NOTE — Progress Notes (Signed)
Subjective:    Patient ID: [name], male    DOB: July 28, 1945, 74 y.o.   MRN: [id number]  HPI  Here to f/u; overall doing ok,  Pt denies chest pain, increasing sob or doe, wheezing, orthopnea, PND, increased LE swelling, palpitations, dizziness or syncope.  Pt denies new neurological symptoms such as new headache, or facial or extremity weakness or numbness.  Pt denies polydipsia, polyuria, or low sugar episode.  Pt states overall good compliance with meds, mostly trying to follow appropriate diet, with wt overall stable,  but little exercise however.  Denies worsening depressive symptoms, suicidal ideation, or panic; has ongoing anxiety, not increased recently.   Had slight low wBC and plt with last labs, chronic stable but pt wants letter to insurance to appeal his denial Past Medical History:  Diagnosis Date  . Depression 12/30/2013  . Hypertension   . Personal history of colonic polyps 12/30/2013   Per colonoscopy at [facility] per pt, about sept 2014  . PTSD (post-traumatic stress disorder)    possible [place] vet  . Tubular adenoma of colon 2014   Past Surgical History:  Procedure Laterality Date  . COLONOSCOPY    . MUSCLE RECESSION AND RESECTION Left 12/25/2018   Procedure: INFERIOR OBLIQUE RECESSION LEFT EYE, SUPERIOR OBLIQUE TUCK LEFT EYE;  Surgeon: Gevena Cotton, MD;  Location: [facility];  Service: Ophthalmology;  Laterality: Left;  . UPPER GASTROINTESTINAL ENDOSCOPY      reports that he has never smoked. He has never used smokeless tobacco. He reports that he does not drink alcohol and does not use drugs. family history includes Hypertension in an other family member. Allergies  Allergen Reactions  . Lisinopril     Angioedema? Patient reports that his tongue and mouth became very swollen, was seen at [facility] for this in 2014.   ' Current Outpatient Medications on File Prior to Visit  Medication Sig Dispense Refill  . ketorolac (ACULAR) 0.5 % ophthalmic solution 1  drop 2 (two) times daily.    Marland Kitchen NIFEdipine (PROCARDIA-XL/NIFEDICAL-XL) 30 MG 24 hr tablet Take 1 tablet (30 mg total) by mouth daily. 90 tablet 3  . polyethylene glycol powder (GLYCOLAX/MIRALAX) 17 GM/SCOOP powder Take 17 g by mouth 2 (two) times daily as needed. (Patient taking differently: Take 17 g by mouth as directed. 1/2 to 2 scoops daily to achieve 1-2 complete bowel movements.) 3350 g 11  . sildenafil (VIAGRA) 100 MG tablet Take 0.5-1 tablets (50-100 mg total) by mouth daily as needed for erectile dysfunction. 5 tablet 11  . tadalafil (CIALIS) 20 MG tablet Take 1 tablet (20 mg total) by mouth daily as needed for erectile dysfunction. 10 tablet 11  . Vitamin D, Ergocalciferol, (DRISDOL) 1.25 MG (50000 UT) CAPS capsule Take 1 capsule (50,000 Units total) by mouth every 7 (seven) days. 12 capsule 0   No current facility-administered medications on file prior to visit.   Review of Systems All otherwise neg per pt    Objective:   Physical Exam BP 120/80 (BP Location: Left Arm, Patient Position: Sitting, Cuff Size: Large)   Pulse 62   Temp 98.9 F (37.2 C) (Oral)   Ht 5\' 7"  (1.702 m)   Wt 200 lb (90.7 kg)   SpO2 98%   BMI 31.32 kg/m  VS noted,  Constitutional: Pt appears in NAD HENT: Head: NCAT.  Right Ear: External ear normal.  Left Ear: External ear normal.  Eyes: . Pupils are equal, round, and reactive to light. Conjunctivae and  EOM are normal Nose: without d/c or deformity Neck: Neck supple. Gross normal ROM Cardiovascular: Normal rate and regular rhythm.   Pulmonary/Chest: Effort normal and breath sounds without rales or wheezing.  Abd:  Soft, NT, ND, + BS, no organomegaly Neurological: Pt is alert. At baseline orientation, motor grossly intact Skin: Skin is warm. No rashes, other new lesions, no LE edema Psychiatric: Pt behavior is normal without agitation  All otherwise neg per pt Lab Results  Component Value Date   WBC 3.1 (L) 09/30/2019   HGB 12.6 (L) 09/30/2019    HCT 38.3 (L) 09/30/2019   PLT 119.0 (L) 09/30/2019   GLUCOSE 94 09/30/2019   CHOL 131 09/30/2019   TRIG 46.0 09/30/2019   HDL 50.30 09/30/2019   LDLCALC 71 09/30/2019   ALT 16 09/30/2019   AST 22 09/30/2019   NA 141 09/30/2019   K 3.8 09/30/2019   CL 106 09/30/2019   CREATININE 1.22 09/30/2019   BUN 13 09/30/2019   CO2 31 09/30/2019   TSH 1.15 09/30/2019   PSA 1.43 09/30/2019        Assessment & Plan:

## 2019-10-10 NOTE — Patient Instructions (Addendum)
Please take OTC Vitamin D3 at 2000 units per day, indefinitely.  Please continue all other medications as before, and refills have been done if requested.  Please have the pharmacy call with any other refills you may need.  Please continue your efforts at being more active, low cholesterol diet, and weight control.  You are otherwise up to date with prevention measures today.  Please keep your appointments with your specialists as you may have planned  Please make an Appointment to return in Jan 2022, or sooner if needed

## 2019-10-10 NOTE — Assessment & Plan Note (Signed)
stable overall by history and exam, recent data reviewed with pt, and pt to continue medical treatment as before,  to f/u any worsening symptoms or concerns  

## 2019-10-10 NOTE — Telephone Encounter (Signed)
New message:    1.Medication Requested: losartan (COZAAR) 100 MG tablet NIFEdipine (PROCARDIA-XL/NIFEDICAL-XL) 30 MG 24 hr tablet 2. Pharmacy (Name, Street, [place]): [facility] [phone] - [place], [address] AT [address]. On Med List: Yes  4. Last Visit with PCP: 08/07/19  5. Next visit date with PCP: none   Pt is asking for a yearly supply  Agent: Please be advised that RX refills may take up to 3 business days. We ask that you follow-up with your pharmacy.

## 2019-10-10 NOTE — Assessment & Plan Note (Signed)
Chronic low mild - stable, of no clinical significance

## 2019-10-10 NOTE — Assessment & Plan Note (Addendum)
stable overall by history and exam, recent data reviewed with pt, and pt to continue medical treatment as before,  to f/u any worsening symptoms or concerns  I spent 31 minutes in preparing to see the patient by review of recent labs, imaging and procedures, obtaining and reviewing separately obtained history, communicating with the patient and family or caregiver, ordering medications, tests or procedures, and documenting clinical information in the EHR including the differential Dx, treatment, and any further evaluation and other management of htn, depression, vit d def, leukocytosis, TCP

## 2019-10-10 NOTE — Telephone Encounter (Signed)
Erx were sent in today.

## 2019-11-18 ENCOUNTER — Emergency Department (HOSPITAL_COMMUNITY): Payer: BC Managed Care – PPO

## 2019-11-18 ENCOUNTER — Emergency Department (HOSPITAL_COMMUNITY)
Admission: EM | Admit: 2019-11-18 | Discharge: 2019-11-18 | Disposition: A | Discharge status: Home / Self Care | Payer: BC Managed Care – PPO | Source: Home / Self Care | Attending: Emergency Medicine | Admitting: Emergency Medicine

## 2019-11-18 ENCOUNTER — Ambulatory Visit (INDEPENDENT_AMBULATORY_CARE_PROVIDER_SITE_OTHER): Payer: BC Managed Care – PPO | Admitting: Internal Medicine

## 2019-11-18 ENCOUNTER — Other Ambulatory Visit: Payer: Self-pay

## 2019-11-18 ENCOUNTER — Encounter: Payer: Self-pay | Admitting: Internal Medicine

## 2019-11-18 ENCOUNTER — Encounter (HOSPITAL_COMMUNITY): Payer: Self-pay | Admitting: Emergency Medicine

## 2019-11-18 DIAGNOSIS — K5909 Other constipation: Secondary | ICD-10-CM

## 2019-11-18 DIAGNOSIS — I959 Hypotension, unspecified: Secondary | ICD-10-CM | POA: Insufficient documentation

## 2019-11-18 DIAGNOSIS — I10 Essential (primary) hypertension: Secondary | ICD-10-CM | POA: Insufficient documentation

## 2019-11-18 DIAGNOSIS — R2689 Other abnormalities of gait and mobility: Secondary | ICD-10-CM | POA: Insufficient documentation

## 2019-11-18 DIAGNOSIS — E860 Dehydration: Secondary | ICD-10-CM

## 2019-11-18 DIAGNOSIS — I951 Orthostatic hypotension: Secondary | ICD-10-CM | POA: Diagnosis not present

## 2019-11-18 DIAGNOSIS — U071 COVID-19: Secondary | ICD-10-CM | POA: Insufficient documentation

## 2019-11-18 DIAGNOSIS — J9601 Acute respiratory failure with hypoxia: Secondary | ICD-10-CM | POA: Diagnosis not present

## 2019-11-18 DIAGNOSIS — I9589 Other hypotension: Secondary | ICD-10-CM

## 2019-11-18 DIAGNOSIS — R079 Chest pain, unspecified: Secondary | ICD-10-CM | POA: Insufficient documentation

## 2019-11-18 HISTORY — DX: Acute respiratory failure with hypoxia: J96.01

## 2019-11-18 HISTORY — DX: COVID-19: U07.1

## 2019-11-18 HISTORY — DX: Hypotension, unspecified: I95.9

## 2019-11-18 LAB — COMPLETE METABOLIC PANEL WITH GFR
AG Ratio: 1.3 (calc) (ref 1.0–2.5)
ALT: 24 U/L (ref 9–46)
AST: 53 U/L — ABNORMAL HIGH (ref 10–35)
Albumin: 3.9 g/dL (ref 3.6–5.1)
Alkaline phosphatase (APISO): 55 U/L (ref 35–144)
BUN/Creatinine Ratio: 12 (calc) (ref 6–22)
BUN: 21 mg/dL (ref 7–25)
CO2: 30 mmol/L (ref 20–32)
Calcium: 8.8 mg/dL (ref 8.6–10.3)
Chloride: 98 mmol/L (ref 98–110)
Creat: 1.74 mg/dL — ABNORMAL HIGH (ref 0.70–1.18)
GFR, Est African American: 44 mL/min/{1.73_m2} — ABNORMAL LOW (ref >=60)
GFR, Est Non African American: 38 mL/min/{1.73_m2} — ABNORMAL LOW (ref >=60)
Globulin: 3 g/dL (calc) (ref 1.9–3.7)
Glucose, Bld: 107 mg/dL — ABNORMAL HIGH (ref 65–99)
Potassium: 4.2 mmol/L (ref 3.5–5.3)
Sodium: 135 mmol/L (ref 135–146)
Total Bilirubin: 0.6 mg/dL (ref 0.2–1.2)
Total Protein: 6.9 g/dL (ref 6.1–8.1)

## 2019-11-18 LAB — COMPREHENSIVE METABOLIC PANEL
ALT: 31 U/L (ref 0–44)
AST: 61 U/L — ABNORMAL HIGH (ref 15–41)
Albumin: 3.6 g/dL (ref 3.5–5.0)
Alkaline Phosphatase: 55 U/L (ref 38–126)
Anion gap: 8 (ref 5–15)
BUN: 19 mg/dL (ref 8–23)
CO2: 28 mmol/L (ref 22–32)
Calcium: 8.8 mg/dL — ABNORMAL LOW (ref 8.9–10.3)
Chloride: 97 mmol/L — ABNORMAL LOW (ref 98–111)
Creatinine, Ser: 1.62 mg/dL — ABNORMAL HIGH (ref 0.61–1.24)
GFR calc Af Amer: 48 mL/min — ABNORMAL LOW (ref >60)
GFR calc non Af Amer: 41 mL/min — ABNORMAL LOW (ref >60)
Glucose, Bld: 103 mg/dL — ABNORMAL HIGH (ref 70–99)
Potassium: 4.2 mmol/L (ref 3.5–5.1)
Sodium: 133 mmol/L — ABNORMAL LOW (ref 135–145)
Total Bilirubin: 0.6 mg/dL (ref 0.3–1.2)
Total Protein: 6.8 g/dL (ref 6.5–8.1)

## 2019-11-18 LAB — URINALYSIS, ROUTINE W REFLEX MICROSCOPIC
Bilirubin Urine: NEGATIVE
Glucose, UA: NEGATIVE mg/dL
Ketones, ur: NEGATIVE mg/dL
Leukocytes,Ua: NEGATIVE
Nitrite: NEGATIVE
Protein, ur: 100 mg/dL — AB
Specific Gravity, Urine: 1.016 (ref 1.005–1.030)
pH: 5 (ref 5.0–8.0)

## 2019-11-18 LAB — CBC WITH DIFFERENTIAL/PLATELET
Abs Immature Granulocytes: 0.01 10*3/uL (ref 0.00–0.07)
Basophils Absolute: 0 10*3/uL (ref 0.0–0.1)
Basophils Relative: 0 %
Eosinophils Absolute: 0 10*3/uL (ref 0.0–0.5)
Eosinophils Relative: 0 %
HCT: 41.6 % (ref 39.0–52.0)
Hemoglobin: 13.3 g/dL (ref 13.0–17.0)
Immature Granulocytes: 0 %
Lymphocytes Relative: 23 %
Lymphs Abs: 0.9 10*3/uL (ref 0.7–4.0)
MCH: 29 pg (ref 26.0–34.0)
MCHC: 32 g/dL (ref 30.0–36.0)
MCV: 90.8 fL (ref 80.0–100.0)
Monocytes Absolute: 0.4 10*3/uL (ref 0.1–1.0)
Monocytes Relative: 10 %
Neutro Abs: 2.5 10*3/uL (ref 1.7–7.7)
Neutrophils Relative %: 67 %
Platelets: 86 10*3/uL — ABNORMAL LOW (ref 150–400)
RBC: 4.58 MIL/uL (ref 4.22–5.81)
RDW: 13.2 % (ref 11.5–15.5)
WBC: 3.7 10*3/uL — ABNORMAL LOW (ref 4.0–10.5)
nRBC: 0 % (ref 0.0–0.2)

## 2019-11-18 LAB — LACTIC ACID, PLASMA: Lactic Acid, Venous: 1.1 mmol/L (ref 0.5–1.9)

## 2019-11-18 LAB — SARS CORONAVIRUS 2 BY RT PCR (HOSPITAL ORDER, PERFORMED IN ~~LOC~~ HOSPITAL LAB): SARS Coronavirus 2: POSITIVE — AB

## 2019-11-18 LAB — D-DIMER, QUANTITATIVE: D-Dimer, Quant: 1.54 mcg/mL FEU — ABNORMAL HIGH (ref <0.50)

## 2019-11-18 MED ORDER — IOHEXOL 350 MG/ML SOLN
100.0000 mL | Freq: Once | INTRAVENOUS | Status: AC | PRN
  Administered 2019-11-18: 100 mL via INTRAVENOUS

## 2019-11-18 MED ORDER — SODIUM CHLORIDE 0.9 % IV BOLUS
1000.0000 mL | Freq: Once | INTRAVENOUS | Status: AC
  Administered 2019-11-18: 1000 mL via INTRAVENOUS

## 2019-11-18 MED ORDER — SODIUM CHLORIDE 0.9% FLUSH: 3.0000 mL | Freq: Once | INTRAVENOUS | Status: DC

## 2019-11-18 NOTE — ED Notes (Signed)
Pt transported to MRI 

## 2019-11-18 NOTE — Patient Instructions (Signed)
You have new very low oxygen, and low blood pressure  Please go to the LAB at the blood drawing area for the tests to be done  Please then go to the Emergency Room (at [facility] or [facility]) to be checked NOW asap

## 2019-11-18 NOTE — Discharge Instructions (Addendum)
You were evaluated in the Emergency Department and after careful evaluation, we did not find any emergent condition requiring admission or further testing in the hospital.  Your exam/testing today was overall reassuring.  Testing today did not show any signs of stroke.  Your labs did show some signs of dehydration.  Please increase your food and water intake and follow-up closely with your primary care doctor.  Please return to the Emergency Department if you experience any worsening of your condition.  Thank you for allowing [place] to be a part of your care.

## 2019-11-18 NOTE — ED Triage Notes (Signed)
Pt arrives from his pcp after being seen today due to pt feeling off balance and ill for 1 week per notes 02 sat's were 91% and BP was in 90's which is not normal. Pt is alert and ox4 arrives with wife. Pt has no other complaints

## 2019-11-18 NOTE — Progress Notes (Addendum)
Subjective:    Patient ID: [name], male    DOB: 03/03/1946, 74 y.o.   MRN: [id number]  HPI  Here to f/u and appears mild confused, and wife somewhat helpful stating he appears somewhat ill for past few days with feeling warm at times, non prod cough, nausea but no vomiting or worseing abd pain or diarrhea.  Has had decreased po intake.  Pt denies chest pain, increased sob or doe, wheezing, orthopnea, PND, increased LE swelling, palpitations, dizziness or syncope, though o2 sat on 2 different oximeters are < 90%.  BP also low and pt appears weak, off balance to walk and c/o dizziness.  Pt denies new neurological symptoms such as new headache, or facial or extremity weakness or numbness   Pt denies polydipsia, polyuria,  No known covid exposure, no sick contacts including wife with him  Denies worsening reflux, abd pain, dysphagia, n/v, bowel change or blood except for the above  Wife does not want to wait so long in the ED so asking for labs done here stat on the way over. Past Medical History:  Diagnosis Date  . Depression 12/30/2013  . Hypertension   . Personal history of colonic polyps 12/30/2013   Per colonoscopy at [facility] per pt, about sept 2014  . PTSD (post-traumatic stress disorder)    possible [place] vet  . Tubular adenoma of colon 2014   Past Surgical History:  Procedure Laterality Date  . COLONOSCOPY    . MUSCLE RECESSION AND RESECTION Left 12/25/2018   Procedure: INFERIOR OBLIQUE RECESSION LEFT EYE, SUPERIOR OBLIQUE TUCK LEFT EYE;  Surgeon: Gevena Cotton, MD;  Location: [facility];  Service: Ophthalmology;  Laterality: Left;  . UPPER GASTROINTESTINAL ENDOSCOPY      reports that he has never smoked. He has never used smokeless tobacco. He reports that he does not drink alcohol and does not use drugs. family history includes Hypertension in an other family member. Allergies  Allergen Reactions  . Lisinopril     Angioedema? Patient reports that his tongue  and mouth became very swollen, was seen at [facility] for this in 2014.    No current facility-administered medications on file prior to visit.   Current Outpatient Medications on File Prior to Visit  Medication Sig Dispense Refill  . citalopram (CELEXA) 10 MG tablet Take 1 tablet (10 mg total) by mouth daily. 90 tablet 3  . losartan (COZAAR) 100 MG tablet Take 1 tablet (100 mg total) by mouth daily. 90 tablet 3  . NIFEdipine (PROCARDIA-XL/NIFEDICAL-XL) 30 MG 24 hr tablet Take 1 tablet (30 mg total) by mouth daily. 90 tablet 3  . polyethylene glycol powder (GLYCOLAX/MIRALAX) 17 GM/SCOOP powder Take 17 g by mouth 2 (two) times daily as needed. (Patient taking differently: Take 8.5-17 g by mouth daily as needed for mild constipation or moderate constipation. To achieve 1-2 complete bowel movements.) 3350 g 11  . sildenafil (VIAGRA) 100 MG tablet Take 0.5-1 tablets (50-100 mg total) by mouth daily as needed for erectile dysfunction. (Patient not taking: Reported on 11/18/2019) 5 tablet 11  . tadalafil (CIALIS) 20 MG tablet Take 1 tablet (20 mg total) by mouth daily as needed for erectile dysfunction. (Patient not taking: Reported on 11/18/2019) 10 tablet 11   Review of Systems All otherwise neg per pt    Objective:   Physical Exam BP 90/60 (BP Location: Left Arm, Patient Position: Sitting, Cuff Size: Large)   Pulse 63   Temp 98.6 F (37 C) (Oral)  Ht 5\' 7"  (1.702 m)   Wt 190 lb (86.2 kg)   SpO2 91%   BMI 29.76 kg/m  VS noted,  Constitutional: Pt appears in NAD HENT: Head: NCAT.  Right Ear: External ear normal.  Left Ear: External ear normal.  Eyes: . Pupils are equal, round, and reactive to light. Conjunctivae and EOM are normal Nose: without d/c or deformity Neck: Neck supple. Gross normal ROM Cardiovascular: Normal rate and regular rhythm.   Pulmonary/Chest: Effort normal and breath sounds without rales or wheezing.  Abd:  Soft, NT, ND, + BS, no organomegaly Neurological: Pt is alert. At  baseline orientation, motor grossly intact Skin: Skin is warm. No rashes, other new lesions, no LE edema Psychiatric: Pt behavior is normal without agitation  All otherwise neg per pt Lab Results  Component Value Date   WBC 13.2 (H) 11/30/2019   HGB 13.1 11/30/2019   HCT 39.9 11/30/2019   PLT 173 11/30/2019   GLUCOSE 119 (H) 11/30/2019   CHOL 131 09/30/2019   TRIG 46.0 09/30/2019   HDL 50.30 09/30/2019   LDLCALC 71 09/30/2019   ALT 30 11/30/2019   AST 21 11/30/2019   NA 137 11/30/2019   K 3.8 11/30/2019   CL 103 11/30/2019   CREATININE 1.02 11/30/2019   BUN 17 11/30/2019   CO2 25 11/30/2019   TSH 0.697 11/21/2019   PSA 1.43 09/30/2019      Assessment & Plan:

## 2019-11-18 NOTE — Assessment & Plan Note (Signed)
By report some improved, .stable overall by history and exam, recent data reviewed with pt, and pt to continue medical treatment as before,  to f/u any worsening symptoms or concerns

## 2019-11-18 NOTE — Assessment & Plan Note (Addendum)
Exam benign, but has persistent o2 sat on RA at rest in the 85-88% range, though fortunately little in the way of symptoms; cant r/o covid as though no fever feels some warm to me, or other PE or PNA; doubt CHF as does not appear volume overloaded  I spent 41 minutes in preparing to see the patient by review of recent labs, imaging and procedures, obtaining and reviewing separately obtained history, communicating with the patient and family or caregiver, ordering medications, tests or procedures, and documenting clinical information in the EHR including the differential Dx, treatment, and any further evaluation and other management of acute hypox resp failure, hypotension, constipation

## 2019-11-18 NOTE — Assessment & Plan Note (Addendum)
Has not taken any vasoactive meds today but very unsteady and high risk for fall; I suspectthis is  related to ongoing illness as yet undefined; for labs today as hopefully this will help his trip to ED management as likely will have several hour wait

## 2019-11-18 NOTE — ED Notes (Signed)
Patient verbalizes understanding of discharge instructions. Opportunity for questioning and answers were provided. Armband removed by staff, pt discharged from ED via wheelchair.  

## 2019-11-18 NOTE — ED Provider Notes (Signed)
[facility] Emergency Department Provider Note MRN:  [id number]  Arrival date & time: 11/18/19     Chief Complaint   Hypotension   History of Present Illness   [name] is a 74 y.o. year-old male with a history of hypertension presenting to the ED with chief complaint of hypotension.  Patient found to be with low blood pressure at PCP office today.  Was being seen for feeling off balance for 1 week.  Denies fever, intermittent cough, no headache, no vision change, no neck pain, no chest pain or shortness of breath, no abdominal pain, no nausea or vomiting or diarrhea or constipation, no numbness or weakness to the arms or legs.  Very infrequently feels lightheaded but mostly feels like his balance has gotten significantly worse for the past week.  Review of Systems  A complete 10 system review of systems was obtained and all systems are negative except as noted in the HPI and PMH.   Patient's Health History    Past Medical History:  Diagnosis Date  . Depression 12/30/2013  . Hypertension   . Personal history of colonic polyps 12/30/2013   Per [facility] at [facility] per pt, about sept 2014  . PTSD (post-traumatic stress disorder)    possible [place] vet  . Tubular adenoma of colon 2014    Past Surgical History:  Procedure Laterality Date  . COLONOSCOPY    . MUSCLE RECESSION AND RESECTION Left 12/25/2018   Procedure: INFERIOR OBLIQUE RECESSION LEFT EYE, SUPERIOR OBLIQUE TUCK LEFT EYE;  Surgeon: Gevena Cotton, MD;  Location: [facility];  Service: Ophthalmology;  Laterality: Left;  . UPPER GASTROINTESTINAL ENDOSCOPY      Family History  Problem Relation Age of Onset  . Hypertension Other   . Colon cancer Neg Hx   . Esophageal cancer Neg Hx   . Stomach cancer Neg Hx   . Rectal cancer Neg Hx     Social History   Socioeconomic History  . Marital status: Single    Spouse name: Not on file  . Number of children: Not on file  . Years  of education: Not on file  . Highest education level: Not on file  Occupational History  . Not on file  Tobacco Use  . Smoking status: Never Smoker  . Smokeless tobacco: Never Used  Vaping Use  . Vaping Use: Never used  Substance and Sexual Activity  . Alcohol use: No    Alcohol/week: 0.0 standard drinks  . Drug use: No  . Sexual activity: Yes  Other Topics Concern  . 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 Difficulty of Paying Living Expenses:   Food Insecurity:   . Worried About Charity fundraiser in the Last Year:   . Arboriculturist in the Last Year:   Transportation Needs:   . Film/video editor (Medical):   Marland Kitchen Lack of Transportation (Non-Medical):   Physical Activity:   . Days of Exercise per Week:   . Minutes of Exercise per Session:   Stress:   . Feeling of Stress :   Social Connections:   . Frequency of Communication with Friends and Family:   . Frequency of Social Gatherings  with Friends and Family:   . Attends Religious Services:   . Active Member of Clubs or Organizations:   . Attends Archivist Meetings:   Marland Kitchen Marital Status:   Intimate Partner Violence:   . Fear of Current or Ex-Partner:   . Emotionally Abused:   Marland Kitchen Physically Abused:   . Sexually Abused:      Physical Exam   Vitals:   11/18/19 1434 11/18/19 1645  BP: (!) 86/63 (!) 145/91  Pulse: 61 (!) 59  Resp: 14 19  Temp:    SpO2: 99% 100%    CONSTITUTIONAL: Well-appearing, NAD NEURO:  Alert and oriented x 3, normal and symmetric strength and sensation, normal speech, no neglect, no aphasia, no visual field cuts, mild to moderate ataxia with consistent leaning or stumbling to the right during ambulation EYES:  eyes equal and reactive ENT/NECK:  no LAD, no  JVD CARDIO: Regular rate, well-perfused, normal S1 and S2 PULM:  CTAB no wheezing or rhonchi GI/GU:  normal bowel sounds, non-distended, non-tender MSK/SPINE:  No gross deformities, no edema SKIN:  no rash, atraumatic PSYCH:  Appropriate speech and behavior  *Additional and/or pertinent findings included in MDM below  Diagnostic and Interventional Summary    EKG Interpretation  Date/Time:  Tuesday November 18 2019 12:40:27 EDT Ventricular Rate:  65 PR Interval:  160 QRS Duration: 82 QT Interval:  414 QTC Calculation: 430 R Axis:   -2 Text Interpretation: Normal sinus rhythm Normal ECG Confirmed by Gerlene Fee [phone]) on 11/18/2019 3:41:54 PM      Labs Reviewed  COMPREHENSIVE METABOLIC PANEL - Abnormal; Notable for the following components:      Result Value   Sodium 133 (*)    Chloride 97 (*)    Glucose, Bld 103 (*)    Creatinine, Ser 1.62 (*)    Calcium 8.8 (*)    AST 61 (*)    GFR calc non Af Amer 41 (*)    GFR calc Af Amer 48 (*)    All other components within normal limits  CBC WITH DIFFERENTIAL/PLATELET - Abnormal; Notable for the following components:   WBC 3.7 (*)    Platelets 86 (*)    All other components within normal limits  URINALYSIS, ROUTINE W REFLEX MICROSCOPIC - Abnormal; Notable for the following components:   Hgb urine dipstick MODERATE (*)    Protein, ur 100 (*)    Bacteria, UA RARE (*)    All other components within normal limits  SARS CORONAVIRUS 2 BY RT PCR (HOSPITAL ORDER, [place] [facility])  LACTIC ACID, PLASMA    MR BRAIN WO CONTRAST  Final Result    CT Head Wo Contrast  Final Result    CT ANGIO CHEST AORTA W/CM & OR WO/CM  Final Result    DG Chest Port 1 View  Final Result      Medications  sodium chloride flush (NS) 0.9 % injection 3 mL (3 mLs Intravenous Not Given 11/18/19 1648)  sodium chloride 0.9 % bolus 1,000 mL (0 mLs Intravenous Paused 11/18/19 1912)  iohexol (OMNIPAQUE) 350 MG/ML injection 100 mL (100  mLs Intravenous Contrast Given 11/18/19 1807)     Procedures  /  Critical Care Procedures  ED Course and Medical Decision Making  I have reviewed the triage vital signs, the nursing notes, and pertinent available records from the EMR.  Listed above are laboratory and imaging tests that I personally ordered, reviewed, and interpreted and then considered in my medical  decision making (see below for details).      Issues with balance for the past week, consistently stumbling gait on exam, otherwise no focal neurological deficits.  CT imaging does not reveal acute stroke or mass.  Chest x-ray revealed question of change to mediastinum, CTA of the chest is overall reassuring, does show some evidence of possible atypical infection, will screen for Covid.  Patient does not clinically have pneumonia type symptoms.  Overall labs reveal some signs of dehydration, given IV fluids and blood pressure has improved.  MRI obtained to further evaluate for this ataxia and there is no evidence of stroke.  Patient is able to ambulate, he wants to go home, he is appropriate for close PCP follow-up.    Barth Kirks. Sedonia Small, MD [place] [email or web address]  Final Clinical Impressions(s) / ED Diagnoses     ICD-10-CM   1. Dehydration  E86.0   2. Balance problem  R26.89     ED Discharge Orders    None       Discharge Instructions Discussed with and Provided to Patient:     Discharge Instructions     You were evaluated in the Emergency Department and after careful evaluation, we did not find any emergent condition requiring admission or further testing in the hospital.  Your exam/testing today was overall reassuring.  Testing today did not show any signs of stroke.  Your labs did show some signs of dehydration.  Please increase your food and water intake and follow-up closely with your primary care doctor.  Please return to the Emergency Department if you  experience any worsening of your condition.  Thank you for allowing [place] to be a part of your care.        Maudie Flakes, MD 11/18/19 2042

## 2019-11-19 ENCOUNTER — Encounter (HOSPITAL_COMMUNITY): Payer: Self-pay | Admitting: Oncology

## 2019-11-19 ENCOUNTER — Telehealth (HOSPITAL_COMMUNITY): Payer: Self-pay

## 2019-11-19 ENCOUNTER — Telehealth (HOSPITAL_COMMUNITY): Payer: Self-pay | Admitting: Oncology

## 2019-11-19 ENCOUNTER — Telehealth: Payer: Self-pay | Admitting: Internal Medicine

## 2019-11-19 ENCOUNTER — Encounter: Payer: Self-pay | Admitting: Oncology

## 2019-11-19 DIAGNOSIS — Z8616 Personal history of COVID-19: Secondary | ICD-10-CM

## 2019-11-19 HISTORY — DX: Personal history of COVID-19: Z86.16

## 2019-11-19 LAB — SPECIMEN STATUS REPORT

## 2019-11-19 LAB — PROCALCITONIN: Procalcitonin: 0.08 ng/mL (ref 0.00–0.08)

## 2019-11-19 NOTE — Telephone Encounter (Signed)
F/u    The wife thinks it's the chiropractic that causing his issues asking can be taken out of work until 8.16.21

## 2019-11-19 NOTE — Telephone Encounter (Signed)
RE: Mab infusion  Attempted to contact patient to discuss recent COVID-19 positive test results and the possibility of receiving the monoclonal antibody infusion.  I did not reach anybody and there was no voicemail.   Patient is active in my chart so I did send him a message.  We will recheck out to him later this afternoon.  Faythe Casa, NP 11/19/2019 1:33 PM

## 2019-11-19 NOTE — Telephone Encounter (Signed)
New Message:   Pt's spouse is calling and states the pt would like a note for work to be out until the 16th just due to the issues he is having and having all of those test done on yesterday. Pt would like to report back to work on 12/01/19. Please advise.

## 2019-11-20 LAB — URINALYSIS, ROUTINE W REFLEX MICROSCOPIC
Bacteria, UA: NONE SEEN /HPF
Bilirubin Urine: NEGATIVE
Glucose, UA: NEGATIVE
Hyaline Cast: NONE SEEN /LPF
Ketones, ur: NEGATIVE
Leukocytes,Ua: NEGATIVE
Nitrite: NEGATIVE
Specific Gravity, Urine: 1.017 (ref 1.001–1.03)
WBC, UA: NONE SEEN /HPF (ref 0–5)
pH: 5.5 (ref 5.0–8.0)

## 2019-11-20 LAB — CBC WITH DIFFERENTIAL/PLATELET
Absolute Monocytes: 416 cells/uL (ref 200–950)
Basophils Absolute: 12 cells/uL (ref 0–200)
Basophils Relative: 0.3 %
Eosinophils Absolute: 0 cells/uL — ABNORMAL LOW (ref 15–500)
Eosinophils Relative: 0 %
HCT: 40.7 % (ref 38.5–50.0)
Hemoglobin: 13.2 g/dL (ref 13.2–17.1)
Lymphs Abs: 1012 cells/uL (ref 850–3900)
MCH: 28.8 pg (ref 27.0–33.0)
MCHC: 32.4 g/dL (ref 32.0–36.0)
MCV: 88.9 fL (ref 80.0–100.0)
MPV: 10.9 fL (ref 7.5–12.5)
Monocytes Relative: 10.4 %
Neutro Abs: 2560 cells/uL (ref 1500–7800)
Neutrophils Relative %: 64 %
Platelets: 92 10*3/uL — ABNORMAL LOW (ref 140–400)
RBC: 4.58 10*6/uL (ref 4.20–5.80)
RDW: 13.1 % (ref 11.0–15.0)
Total Lymphocyte: 25.3 %
WBC: 4 10*3/uL (ref 3.8–10.8)

## 2019-11-20 LAB — URINE CULTURE: Result:: NO GROWTH

## 2019-11-20 NOTE — Telephone Encounter (Signed)
Tried calling pt and his wife to inform them that pt is COVID positive along with having COVID pneumonia. Pt is to quarantine for the next 14 days and is not symptomatic.  I LDVM on pts wife phone of his results and the details above.

## 2019-11-21 ENCOUNTER — Other Ambulatory Visit: Payer: Self-pay

## 2019-11-21 ENCOUNTER — Inpatient Hospital Stay (HOSPITAL_COMMUNITY)
Admission: EM | Admit: 2019-11-21 | Discharge: 2019-12-09 | DRG: 177 | Disposition: A | Discharge status: Skilled Nursing Facility | Payer: BC Managed Care – PPO | Attending: Internal Medicine | Admitting: Internal Medicine

## 2019-11-21 ENCOUNTER — Emergency Department (HOSPITAL_COMMUNITY): Payer: BC Managed Care – PPO

## 2019-11-21 DIAGNOSIS — D696 Thrombocytopenia, unspecified: Secondary | ICD-10-CM | POA: Diagnosis present

## 2019-11-21 DIAGNOSIS — U071 COVID-19: Principal | ICD-10-CM | POA: Diagnosis present

## 2019-11-21 DIAGNOSIS — I6782 Cerebral ischemia: Secondary | ICD-10-CM | POA: Diagnosis present

## 2019-11-21 DIAGNOSIS — J9601 Acute respiratory failure with hypoxia: Secondary | ICD-10-CM | POA: Diagnosis present

## 2019-11-21 DIAGNOSIS — R5381 Other malaise: Secondary | ICD-10-CM | POA: Diagnosis present

## 2019-11-21 DIAGNOSIS — R410 Disorientation, unspecified: Secondary | ICD-10-CM

## 2019-11-21 DIAGNOSIS — I10 Essential (primary) hypertension: Secondary | ICD-10-CM | POA: Diagnosis present

## 2019-11-21 DIAGNOSIS — I951 Orthostatic hypotension: Secondary | ICD-10-CM | POA: Diagnosis present

## 2019-11-21 DIAGNOSIS — L89152 Pressure ulcer of sacral region, stage 2: Secondary | ICD-10-CM | POA: Diagnosis present

## 2019-11-21 DIAGNOSIS — N179 Acute kidney failure, unspecified: Secondary | ICD-10-CM | POA: Diagnosis present

## 2019-11-21 DIAGNOSIS — K824 Cholesterolosis of gallbladder: Secondary | ICD-10-CM | POA: Diagnosis present

## 2019-11-21 DIAGNOSIS — Z8616 Personal history of COVID-19: Secondary | ICD-10-CM

## 2019-11-21 DIAGNOSIS — F329 Major depressive disorder, single episode, unspecified: Secondary | ICD-10-CM | POA: Diagnosis present

## 2019-11-21 DIAGNOSIS — R9082 White matter disease, unspecified: Secondary | ICD-10-CM | POA: Diagnosis present

## 2019-11-21 DIAGNOSIS — N1831 Chronic kidney disease, stage 3a: Secondary | ICD-10-CM | POA: Diagnosis present

## 2019-11-21 DIAGNOSIS — R7989 Other specified abnormal findings of blood chemistry: Secondary | ICD-10-CM | POA: Diagnosis present

## 2019-11-21 DIAGNOSIS — E669 Obesity, unspecified: Secondary | ICD-10-CM | POA: Diagnosis present

## 2019-11-21 DIAGNOSIS — G9341 Metabolic encephalopathy: Secondary | ICD-10-CM

## 2019-11-21 DIAGNOSIS — R0989 Other specified symptoms and signs involving the circulatory and respiratory systems: Secondary | ICD-10-CM | POA: Diagnosis present

## 2019-11-21 DIAGNOSIS — R0902 Hypoxemia: Secondary | ICD-10-CM

## 2019-11-21 DIAGNOSIS — I1310 Hypertensive heart and chronic kidney disease without heart failure, with stage 1 through stage 4 chronic kidney disease, or unspecified chronic kidney disease: Secondary | ICD-10-CM | POA: Diagnosis present

## 2019-11-21 DIAGNOSIS — R7401 Elevation of levels of liver transaminase levels: Secondary | ICD-10-CM | POA: Diagnosis present

## 2019-11-21 DIAGNOSIS — R748 Abnormal levels of other serum enzymes: Secondary | ICD-10-CM | POA: Diagnosis present

## 2019-11-21 DIAGNOSIS — N183 Chronic kidney disease, stage 3 unspecified: Secondary | ICD-10-CM | POA: Diagnosis present

## 2019-11-21 DIAGNOSIS — Z86010 Personal history of colonic polyps: Secondary | ICD-10-CM

## 2019-11-21 DIAGNOSIS — R945 Abnormal results of liver function studies: Secondary | ICD-10-CM | POA: Diagnosis present

## 2019-11-21 DIAGNOSIS — F4310 Post-traumatic stress disorder, unspecified: Secondary | ICD-10-CM | POA: Diagnosis present

## 2019-11-21 DIAGNOSIS — Z6831 Body mass index (BMI) 31.0-31.9, adult: Secondary | ICD-10-CM

## 2019-11-21 DIAGNOSIS — G9389 Other specified disorders of brain: Secondary | ICD-10-CM | POA: Diagnosis present

## 2019-11-21 DIAGNOSIS — L8990 Pressure ulcer of unspecified site, unspecified stage: Secondary | ICD-10-CM | POA: Insufficient documentation

## 2019-11-21 DIAGNOSIS — Z888 Allergy status to other drugs, medicaments and biological substances status: Secondary | ICD-10-CM

## 2019-11-21 DIAGNOSIS — R0602 Shortness of breath: Secondary | ICD-10-CM | POA: Diagnosis not present

## 2019-11-21 DIAGNOSIS — J1282 Pneumonia due to coronavirus disease 2019: Secondary | ICD-10-CM | POA: Diagnosis present

## 2019-11-21 DIAGNOSIS — E860 Dehydration: Secondary | ICD-10-CM | POA: Diagnosis present

## 2019-11-21 DIAGNOSIS — Z79899 Other long term (current) drug therapy: Secondary | ICD-10-CM | POA: Diagnosis not present

## 2019-11-21 DIAGNOSIS — R42 Dizziness and giddiness: Secondary | ICD-10-CM | POA: Diagnosis not present

## 2019-11-21 DIAGNOSIS — R55 Syncope and collapse: Secondary | ICD-10-CM | POA: Diagnosis not present

## 2019-11-21 HISTORY — DX: Hypoxemia: R09.02

## 2019-11-21 HISTORY — DX: Metabolic encephalopathy: G93.41

## 2019-11-21 LAB — COMPREHENSIVE METABOLIC PANEL
ALT: 48 U/L — ABNORMAL HIGH (ref 0–44)
ALT: 53 U/L — ABNORMAL HIGH (ref 0–44)
AST: 101 U/L — ABNORMAL HIGH (ref 15–41)
AST: 101 U/L — ABNORMAL HIGH (ref 15–41)
Albumin: 3.3 g/dL — ABNORMAL LOW (ref 3.5–5.0)
Albumin: 3.4 g/dL — ABNORMAL LOW (ref 3.5–5.0)
Alkaline Phosphatase: 50 U/L (ref 38–126)
Alkaline Phosphatase: 53 U/L (ref 38–126)
Anion gap: 10 (ref 5–15)
Anion gap: 13 (ref 5–15)
BUN: 16 mg/dL (ref 8–23)
BUN: 14 mg/dL (ref 8–23)
CO2: 28 mmol/L (ref 22–32)
CO2: 25 mmol/L (ref 22–32)
Calcium: 8.6 mg/dL — ABNORMAL LOW (ref 8.9–10.3)
Calcium: 8.7 mg/dL — ABNORMAL LOW (ref 8.9–10.3)
Chloride: 100 mmol/L (ref 98–111)
Chloride: 101 mmol/L (ref 98–111)
Creatinine, Ser: 1.55 mg/dL — ABNORMAL HIGH (ref 0.61–1.24)
Creatinine, Ser: 1.28 mg/dL — ABNORMAL HIGH (ref 0.61–1.24)
GFR calc Af Amer: 50 mL/min — ABNORMAL LOW (ref >60)
GFR calc Af Amer: >60 mL/min (ref >60)
GFR calc non Af Amer: 43 mL/min — ABNORMAL LOW (ref >60)
GFR calc non Af Amer: 55 mL/min — ABNORMAL LOW (ref >60)
Glucose, Bld: 120 mg/dL — ABNORMAL HIGH (ref 70–99)
Glucose, Bld: 122 mg/dL — ABNORMAL HIGH (ref 70–99)
Potassium: 4.1 mmol/L (ref 3.5–5.1)
Potassium: 4.4 mmol/L (ref 3.5–5.1)
Sodium: 138 mmol/L (ref 135–145)
Sodium: 139 mmol/L (ref 135–145)
Total Bilirubin: 0.9 mg/dL (ref 0.3–1.2)
Total Bilirubin: 0.8 mg/dL (ref 0.3–1.2)
Total Protein: 7 g/dL (ref 6.5–8.1)
Total Protein: 7 g/dL (ref 6.5–8.1)

## 2019-11-21 LAB — HEPATITIS B SURFACE ANTIGEN: Hepatitis B Surface Ag: NONREACTIVE

## 2019-11-21 LAB — URINALYSIS, ROUTINE W REFLEX MICROSCOPIC
Bacteria, UA: NONE SEEN
Bilirubin Urine: NEGATIVE
Glucose, UA: NEGATIVE mg/dL
Ketones, ur: NEGATIVE mg/dL
Leukocytes,Ua: NEGATIVE
Nitrite: NEGATIVE
Protein, ur: 100 mg/dL — AB
Specific Gravity, Urine: 1.014 (ref 1.005–1.030)
pH: 6 (ref 5.0–8.0)

## 2019-11-21 LAB — CBC WITH DIFFERENTIAL/PLATELET
Abs Immature Granulocytes: 0.03 10*3/uL (ref 0.00–0.07)
Basophils Absolute: 0 10*3/uL (ref 0.0–0.1)
Basophils Relative: 0 %
Eosinophils Absolute: 0 10*3/uL (ref 0.0–0.5)
Eosinophils Relative: 0 %
HCT: 41.5 % (ref 39.0–52.0)
Hemoglobin: 13.4 g/dL (ref 13.0–17.0)
Immature Granulocytes: 1 %
Lymphocytes Relative: 11 %
Lymphs Abs: 0.5 10*3/uL — ABNORMAL LOW (ref 0.7–4.0)
MCH: 28.9 pg (ref 26.0–34.0)
MCHC: 32.3 g/dL (ref 30.0–36.0)
MCV: 89.4 fL (ref 80.0–100.0)
Monocytes Absolute: 0.4 10*3/uL (ref 0.1–1.0)
Monocytes Relative: 8 %
Neutro Abs: 3.8 10*3/uL (ref 1.7–7.7)
Neutrophils Relative %: 80 %
Platelets: UNDETERMINED 10*3/uL (ref 150–400)
RBC: 4.64 MIL/uL (ref 4.22–5.81)
RDW: 13.2 % (ref 11.5–15.5)
WBC: 4.8 10*3/uL (ref 4.0–10.5)
nRBC: 0 % (ref 0.0–0.2)

## 2019-11-21 LAB — D-DIMER, QUANTITATIVE: D-Dimer, Quant: 1.25 ug/mL-FEU — ABNORMAL HIGH (ref 0.00–0.50)

## 2019-11-21 LAB — TROPONIN I (HIGH SENSITIVITY)
Troponin I (High Sensitivity): 22 ng/L — ABNORMAL HIGH (ref <18)
Troponin I (High Sensitivity): 24 ng/L — ABNORMAL HIGH (ref <18)

## 2019-11-21 LAB — RAPID URINE DRUG SCREEN, HOSP PERFORMED
Amphetamines: NOT DETECTED
Barbiturates: NOT DETECTED
Benzodiazepines: NOT DETECTED
Cocaine: NOT DETECTED
Opiates: NOT DETECTED
Tetrahydrocannabinol: NOT DETECTED

## 2019-11-21 LAB — LACTATE DEHYDROGENASE: LDH: 388 U/L — ABNORMAL HIGH (ref 98–192)

## 2019-11-21 LAB — FOLATE: Folate: 26.9 ng/mL (ref >5.9)

## 2019-11-21 LAB — PROCALCITONIN: Procalcitonin: <0.1 ng/mL

## 2019-11-21 LAB — C-REACTIVE PROTEIN: CRP: 9.5 mg/dL — ABNORMAL HIGH (ref <1.0)

## 2019-11-21 LAB — TSH: TSH: 0.697 u[IU]/mL (ref 0.350–4.500)

## 2019-11-21 LAB — VITAMIN B12: Vitamin B-12: 731 pg/mL (ref 180–914)

## 2019-11-21 LAB — BRAIN NATRIURETIC PEPTIDE: B Natriuretic Peptide: 73.2 pg/mL (ref 0.0–100.0)

## 2019-11-21 LAB — AMMONIA: Ammonia: 18 umol/L (ref 9–35)

## 2019-11-21 LAB — HEPATITIS PANEL, ACUTE
HCV Ab: NONREACTIVE
Hep A IgM: NONREACTIVE
Hep B C IgM: NONREACTIVE
Hepatitis B Surface Ag: NONREACTIVE

## 2019-11-21 LAB — FERRITIN
Ferritin: 838 ng/mL — ABNORMAL HIGH (ref 24–336)
Ferritin: 930 ng/mL — ABNORMAL HIGH (ref 24–336)

## 2019-11-21 LAB — ETHANOL: Alcohol, Ethyl (B): <10 mg/dL (ref <10)

## 2019-11-21 LAB — FIBRINOGEN: Fibrinogen: 563 mg/dL — ABNORMAL HIGH (ref 210–475)

## 2019-11-21 LAB — ABO/RH: ABO/RH(D): O POS

## 2019-11-21 MED ORDER — ALBUTEROL SULFATE HFA 108 (90 BASE) MCG/ACT IN AERS
2.0000 | INHALATION_SPRAY | Freq: Four times a day (QID) | RESPIRATORY_TRACT | Status: DC
  Administered 2019-11-21 – 2019-11-23 (×9): 2 via RESPIRATORY_TRACT
  Filled 2019-11-21: qty 6.7

## 2019-11-21 MED ORDER — ACETAMINOPHEN 325 MG PO TABS
650.0000 mg | ORAL_TABLET | Freq: Four times a day (QID) | ORAL | Status: DC | PRN
  Administered 2019-11-29 – 2019-12-07 (×5): 650 mg via ORAL
  Filled 2019-11-21 (×5): qty 2

## 2019-11-21 MED ORDER — SODIUM CHLORIDE 0.9 % IV SOLN: 100.0000 mg | Freq: Every day | INTRAVENOUS | Status: DC

## 2019-11-21 MED ORDER — EPINEPHRINE 0.3 MG/0.3ML IJ SOAJ: 0.3000 mg | Freq: Once | INTRAMUSCULAR | Status: DC | PRN

## 2019-11-21 MED ORDER — DEXAMETHASONE SODIUM PHOSPHATE 10 MG/ML IJ SOLN
6.0000 mg | Freq: Every day | INTRAMUSCULAR | Status: DC
  Administered 2019-11-21 – 2019-11-27 (×7): 6 mg via INTRAVENOUS
  Filled 2019-11-21 (×7): qty 1

## 2019-11-21 MED ORDER — ONDANSETRON HCL 4 MG PO TABS
4.0000 mg | ORAL_TABLET | Freq: Four times a day (QID) | ORAL | Status: DC | PRN
  Administered 2019-12-09: 4 mg via ORAL
  Filled 2019-11-21: qty 1

## 2019-11-21 MED ORDER — CASIRIVIMAB-IMDEVIMAB 600-600 MG/10ML IJ SOLN
1200.0000 mg | Freq: Once | INTRAMUSCULAR | Status: DC
Start: 2019-11-21 — End: 2019-11-21

## 2019-11-21 MED ORDER — SODIUM CHLORIDE 0.9 % IV SOLN
100.0000 mg | Freq: Every day | INTRAVENOUS | Status: AC
  Administered 2019-11-22 – 2019-11-25 (×4): 100 mg via INTRAVENOUS
  Filled 2019-11-21 (×5): qty 20

## 2019-11-21 MED ORDER — ALBUTEROL SULFATE HFA 108 (90 BASE) MCG/ACT IN AERS
2.0000 | INHALATION_SPRAY | Freq: Once | RESPIRATORY_TRACT | Status: AC | PRN
  Administered 2019-11-21: 2 via RESPIRATORY_TRACT
  Filled 2019-11-21: qty 6.7

## 2019-11-21 MED ORDER — HYDROCOD POLST-CPM POLST ER 10-8 MG/5ML PO SUER: 5.0000 mL | Freq: Two times a day (BID) | ORAL | Status: DC | PRN

## 2019-11-21 MED ORDER — CITALOPRAM HYDROBROMIDE 20 MG PO TABS
10.0000 mg | ORAL_TABLET | Freq: Every day | ORAL | Status: DC
  Administered 2019-11-21 – 2019-12-09 (×19): 10 mg via ORAL
  Filled 2019-11-21 (×20): qty 1

## 2019-11-21 MED ORDER — ZINC SULFATE 220 (50 ZN) MG PO CAPS
220.0000 mg | ORAL_CAPSULE | Freq: Every day | ORAL | Status: DC
  Administered 2019-11-21 – 2019-11-27 (×7): 220 mg via ORAL
  Filled 2019-11-21 (×7): qty 1

## 2019-11-21 MED ORDER — ENOXAPARIN SODIUM 40 MG/0.4ML ~~LOC~~ SOLN: 40.0000 mg | SUBCUTANEOUS | Status: DC

## 2019-11-21 MED ORDER — SODIUM CHLORIDE 0.9 % IV SOLN: 200.0000 mg | Freq: Once | INTRAVENOUS | Status: DC

## 2019-11-21 MED ORDER — POLYETHYLENE GLYCOL 3350 17 G PO PACK: 17.0000 g | PACK | Freq: Two times a day (BID) | ORAL | Status: DC | PRN

## 2019-11-21 MED ORDER — FAMOTIDINE IN NACL 20-0.9 MG/50ML-% IV SOLN: 20.0000 mg | Freq: Once | INTRAVENOUS | Status: DC | PRN

## 2019-11-21 MED ORDER — NIFEDIPINE ER OSMOTIC RELEASE 30 MG PO TB24: 30.0000 mg | ORAL_TABLET | Freq: Every day | ORAL | Status: DC

## 2019-11-21 MED ORDER — ASCORBIC ACID 500 MG PO TABS
500.0000 mg | ORAL_TABLET | Freq: Every day | ORAL | Status: DC
  Administered 2019-11-21 – 2019-11-27 (×7): 500 mg via ORAL
  Filled 2019-11-21 (×7): qty 1

## 2019-11-21 MED ORDER — LOSARTAN POTASSIUM 50 MG PO TABS: 100.0000 mg | ORAL_TABLET | Freq: Every day | ORAL | Status: DC

## 2019-11-21 MED ORDER — SODIUM CHLORIDE 0.9 % IV SOLN
200.0000 mg | Freq: Once | INTRAVENOUS | Status: AC
  Administered 2019-11-21: 200 mg via INTRAVENOUS
  Filled 2019-11-21: qty 40

## 2019-11-21 MED ORDER — SODIUM CHLORIDE 0.9 % IV SOLN: INTRAVENOUS | Status: DC | PRN

## 2019-11-21 MED ORDER — METHYLPREDNISOLONE SODIUM SUCC 125 MG IJ SOLR: 125.0000 mg | Freq: Once | INTRAMUSCULAR | Status: DC | PRN

## 2019-11-21 MED ORDER — DIPHENHYDRAMINE HCL 50 MG/ML IJ SOLN: 50.0000 mg | Freq: Once | INTRAMUSCULAR | Status: DC | PRN

## 2019-11-21 MED ORDER — GUAIFENESIN-DM 100-10 MG/5ML PO SYRP
10.0000 mL | ORAL_SOLUTION | ORAL | Status: DC | PRN
  Administered 2019-11-24 – 2019-11-30 (×4): 10 mL via ORAL
  Filled 2019-11-21 (×4): qty 10

## 2019-11-21 MED ORDER — ONDANSETRON HCL 4 MG/2ML IJ SOLN: 4.0000 mg | Freq: Four times a day (QID) | INTRAMUSCULAR | Status: DC | PRN

## 2019-11-21 NOTE — ED Triage Notes (Signed)
Pt from home with ems. COVID+ symptomatic for 1 week, positive results yesterday.  Pt c.o generalized weakness and confusion. Pt slow to answer but answers appropriately. Pt orthostatic.  Room air sats 90%, lungs clear. Pt on 4L [place] at 100%. Denies pain.  18G LAC, given 526ml saline bolus.   Bp 90 palpated before fluids, Bp now 140/86  HR80 CBG 148

## 2019-11-21 NOTE — ED Provider Notes (Signed)
[facility] EMERGENCY DEPARTMENT Provider Note   CSN: [id number] Arrival date & time: 11/21/19  4098     History Chief Complaint  Patient presents with  . COVID+  . Weakness    [name] is a 74 y.o. male.  HPI He presents for evaluation of Covid infection with trouble breathing.  He is unable to give history.  It appears that he was attempted to be contacted by the infusion clinic for monoclonal antibody infusion, but no contact was made so he did not get the infusion on 11/19/2019 as attempted.  Level 5 caveat-altered mental status.    Past Medical History:  Diagnosis Date  . Depression 12/30/2013  . Hypertension   . Personal history of colonic polyps 12/30/2013   Per colonoscopy at [facility] per pt, about sept 2014  . PTSD (post-traumatic stress disorder)    possible [place] vet  . Tubular adenoma of colon 2014    Patient Active Problem List   Diagnosis Date Noted  . Acute hypoxemic respiratory failure due to COVID-19 ([place]) 11/18/2019  . Hypotension 11/18/2019  . Leukocytosis 10/10/2019  . Thrombocytopenia ([place]) 10/10/2019  . Vitamin D deficiency 08/07/2019  . Preop examination 11/22/2018  . Chronic constipation 09/16/2018  . Erectile dysfunction 07/11/2017  . 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 Non-compliant behavior 09/15/2014  . History of colonic polyps 12/30/2013  . Preventative health care 12/30/2013  . Depression 12/30/2013  . PTSD (post-traumatic stress disorder) 02/03/2013  . Essential hypertension 02/17/2010    Past Surgical History:  Procedure Laterality Date  . COLONOSCOPY    . MUSCLE RECESSION AND RESECTION Left 12/25/2018   Procedure: INFERIOR OBLIQUE RECESSION LEFT EYE, SUPERIOR OBLIQUE TUCK LEFT EYE;  Surgeon: Gevena Cotton, MD;  Location: [facility];  Service: Ophthalmology;  Laterality: Left;  . UPPER GASTROINTESTINAL ENDOSCOPY         Family History  Problem Relation Age of Onset  . Hypertension Other     . Colon cancer Neg Hx   . Esophageal cancer Neg Hx   . Stomach cancer Neg Hx   . Rectal cancer Neg Hx     Social History   Tobacco Use  . Smoking status: Never Smoker  . Smokeless tobacco: Never Used  Vaping Use  . Vaping Use: Never used  Substance Use Topics  . Alcohol use: No    Alcohol/week: 0.0 standard drinks  . Drug use: No    Home Medications Prior to Admission medications   Medication Sig Start Date End Date Taking? Authorizing Provider  Cholecalciferol (VITAMIN D3) 50 MCG (2000 UT) capsule Take 2,000 Units by mouth daily.    [provider]  citalopram (CELEXA) 10 MG tablet Take 1 tablet (10 mg total) by mouth daily. 10/10/19 10/09/20  Biagio Borg, MD  losartan (COZAAR) 100 MG tablet Take 1 tablet (100 mg total) by mouth daily. 10/10/19   Biagio Borg, MD  Multiple Vitamins-Minerals (MULTIVITAMIN WITH MINERALS) tablet Take 1 tablet by mouth daily.    [provider]  NIFEdipine (PROCARDIA-XL/NIFEDICAL-XL) 30 MG 24 hr tablet Take 1 tablet (30 mg total) by mouth daily. 01/16/19   Biagio Borg, MD  OVER THE COUNTER MEDICATION Take 1 Dose by mouth daily. Minerals    [provider]  polyethylene glycol powder (GLYCOLAX/MIRALAX) 17 GM/SCOOP powder Take 17 g by mouth 2 (two) times daily as needed. Patient taking differently: Take 8.5-17 g by mouth daily as needed for mild constipation or moderate constipation. To achieve  1-2 complete bowel movements. 09/16/18   Biagio Borg, MD  sildenafil (VIAGRA) 100 MG tablet Take 0.5-1 tablets (50-100 mg total) by mouth daily as needed for erectile dysfunction. Patient not taking: Reported on 11/18/2019 07/30/17   Biagio Borg, MD  tadalafil (CIALIS) 20 MG tablet Take 1 tablet (20 mg total) by mouth daily as needed for erectile dysfunction. Patient not taking: Reported on 11/18/2019 07/30/17   Biagio Borg, MD    Allergies    Lisinopril  Review of Systems   Review of Systems  Unable to perform ROS: Mental status  change    Physical Exam Updated Vital Signs BP 129/81   Pulse 73   Temp 99.7 F (37.6 C) (Oral)   Resp (!) 24   Ht 5\' 7"  (1.702 m)   Wt 90.7 kg   SpO2 99%   BMI 31.32 kg/m   Physical Exam Vitals and nursing note reviewed.  Constitutional:      General: He is in acute distress.     Appearance: He is well-developed. He is ill-appearing and toxic-appearing. He is not diaphoretic.  HENT:     Head: Normocephalic and atraumatic.     Right Ear: External ear normal.     Left Ear: External ear normal.  Eyes:     Conjunctiva/sclera: Conjunctivae normal.     Pupils: Pupils are equal, round, and reactive to light.  Neck:     Trachea: Phonation normal.  Cardiovascular:     Rate and Rhythm: Normal rate and regular rhythm.  Pulmonary:     Effort: Pulmonary effort is normal. No respiratory distress.     Breath sounds: No stridor.     Comments: Coughing Abdominal:     General: There is no distension.     Palpations: Abdomen is soft.     Tenderness: There is no abdominal tenderness. There is no guarding.  Musculoskeletal:        General: Normal range of motion.     Cervical back: Normal range of motion and neck supple.  Skin:    General: Skin is warm and dry.  Neurological:     Mental Status: He is alert.     Cranial Nerves: No cranial nerve deficit.     Motor: No abnormal muscle tone.     Coordination: Coordination normal.     Comments: No dysarthria or aphasia  Psychiatric:        Attention and Perception: He is inattentive.        Speech: Speech is delayed.        Behavior: Behavior is slowed.        Cognition and Memory: Cognition is impaired.        Judgment: Judgment is inappropriate.     ED Results / Procedures / Treatments   Labs (all labs ordered are listed, but only abnormal results are displayed) Labs Reviewed  FERRITIN - Abnormal; Notable for the following components:      Result Value   Ferritin 838 (*)    All other components within normal limits  CBC  WITH DIFFERENTIAL/PLATELET - Abnormal; Notable for the following components:   Lymphs Abs 0.5 (*)    All other components within normal limits  COMPREHENSIVE METABOLIC PANEL - Abnormal; Notable for the following components:   Glucose, Bld 120 (*)    Creatinine, Ser 1.55 (*)    Calcium 8.6 (*)    Albumin 3.3 (*)    AST 101 (*)    ALT 48 (*)  GFR calc non Af Amer 43 (*)    GFR calc Af Amer 50 (*)    All other components within normal limits  D-DIMER, QUANTITATIVE (NOT AT [facility]) - Abnormal; Notable for the following components:   D-Dimer, Quant 1.25 (*)    All other components within normal limits  PATHOLOGIST SMEAR REVIEW  ABO/RH    EKG EKG Interpretation  Date/Time:  Friday November 21 2019 10:38:57 EDT Ventricular Rate:  81 PR Interval:    QRS Duration: 85 QT Interval:  359 QTC Calculation: 417 R Axis:   -10 Text Interpretation: Sinus rhythm Probable left atrial enlargement Abnormal R-wave progression, early transition since last tracing no significant change Confirmed by Daleen Bo [id number]) on 11/21/2019 12:01:33 PM   Radiology Portable chest 1 View  Result Date: 11/21/2019 CLINICAL DATA:  COVID positive.  Weakness and shortness of breath. EXAM: PORTABLE CHEST 1 VIEW COMPARISON:  CT 11/18/2019.  Chest x-ray 11/18/2019. FINDINGS: Cardiomegaly. Tortuous thoracic aorta again noted. Prominent bilateral pulmonary infiltrates/edema noted on today's exam. No pleural effusion or pneumothorax. Degenerative change thoracic spine. IMPRESSION: 1.  Cardiomegaly.  No pulmonary venous congestion. 2. Prominent bilateral pulmonary infiltrates/edema noted on today's exam in this known COVID positive patient. Electronically Signed   By: Marcello Moores  Register   On: 11/21/2019 10:51    Procedures .Critical Care Performed by: Daleen Bo, MD Authorized by: Daleen Bo, MD   Critical care provider statement:    Critical care time (minutes):  50   Critical care start time:  11/21/2019 10:20 AM    Critical care end time:  11/21/2019 1:44 PM   Critical care time was exclusive of:  Separately billable procedures and treating other patients   Critical care was time spent personally by me on the following activities:  Blood draw for specimens, development of treatment plan with patient or surrogate, discussions with consultants, evaluation of patient's response to treatment, examination of patient, obtaining history from patient or surrogate, ordering and performing treatments and interventions, ordering and review of laboratory studies, pulse oximetry, re-evaluation of patient's condition, review of old charts and ordering and review of radiographic studies   (including critical care time)  Medications Ordered in ED Medications  remdesivir 200 mg in sodium chloride 0.9% 250 mL IVPB (0 mg Intravenous Stopped 11/21/19 1338)    Followed by  remdesivir 100 mg in sodium chloride 0.9 % 100 mL IVPB (has no administration in time range)  0.9 %  sodium chloride infusion (has no administration in time range)  albuterol (VENTOLIN HFA) 108 (90 Base) MCG/ACT inhaler 2 puff (2 puffs Inhalation Given 11/21/19 1112)    ED Course  I have reviewed the triage vital signs and the nursing notes.  Pertinent labs & imaging results that were available during my care of the patient were reviewed by me and considered in my medical decision making (see chart for details).  Clinical Course as of Nov 21 1347  Fri Nov 21, 2019  1038 Patient presenting with cognitive deficit, and hypoxia, with known Covid infection.  He seems significantly debilitated, based on charting from evaluation, 3 days ago.  He will require hospitalization.  Urgent treatment begun with remdesivir.  Unable to treat with monoclonal antibody therapy, since at this point the patient appears to require hospitalization.   [EW]  5397 Per radiologist, consistent with viral pneumonia/edema  Portable chest 1 View [EW]  1327 Discussed situation with  [name], an adult male who has been staying in the patient's home with him, "to  take care of him."  She states that before 1 week ago the patient was able to take care of himself, drive, shop, bank, but now cannot do any of those things.  He has gotten gradually worse, since he was discharged from the emergency department 3 days ago.  She states that the patient is typically very vivacious, and that he has "never been sick."   [EW]  1329 WBC Morphology: SLIDE TO PATHOLOGY TO REVIEW LYMPHOCYTES. [EW]    Clinical Course User Index [EW] Daleen Bo, MD   MDM Rules/Calculators/A&P                           Patient Vitals for the past 24 hrs:  BP Temp Temp src Pulse Resp SpO2 Height Weight  11/21/19 1330 129/81 -- -- 73 (!) 24 99 % -- --  11/21/19 1115 116/84 -- -- -- -- -- -- --  11/21/19 1114 -- -- -- 81 20 99 % -- --  11/21/19 1112 -- -- -- -- -- 100 % -- --  11/21/19 1111 -- -- -- -- -- -- 5\' 7"  (1.702 m) 90.7 kg  11/21/19 1111 118/81 -- -- 80 (!) 27 100 % -- --  11/21/19 1036 125/75 99.7 F (37.6 C) Oral 75 (!) 30 100 % -- --  11/21/19 0937 130/82 -- -- 80 -- 100 % -- --    1:49 PM Reevaluation with update and discussion. After initial assessment and treatment, an updated evaluation reveals no change in clinical status, he requires hospitalization. Daleen Bo   Medical Decision Making:  This patient is presenting for evaluation of Covid infection with concern for pneumonia, which CBC, Metabolic panel and Covid trending labs require a range of treatment options, and is a complaint that involves a high risk of morbidity and mortality. The differential diagnoses include viral pneumonia, bacterial pneumonia, metabolic disorder, acute mental status changes. I decided to review old records, and in summary elderly male, evaluated in the ED 3 days ago for balance problems, Covid test returned after he was discharged, positive.  I obtained additional historical information from  a friend of the patient's by telephone.  Clinical Laboratory Tests Ordered, included CBC, Metabolic panel and Covid trending labs. Review indicates findings consistent with Covid, elevated ferritin, and stable remainder. Radiologic Tests Ordered, included chest x-ray.  I independently Visualized: Radiographic images, which show abnormal consistent with viral pneumonia  Cardiac Monitor Tracing which shows normal sinus rhythm    Critical Interventions-clinical evaluation, oxygen to support saturation greater than 90%, laboratory testing, chest x-ray, observation reassessment  After These Interventions, the Patient was reevaluated and was found with persistent confusion and improved respiratory status.  Patient has significant mental status changes likely related to Covid infection.  Note that he recently had a very extensive neurologic examination, when he was seen in the ED, 3 days ago.  Also at that time he had CT angio imaging of the chest.  This imaging was consistent with a viral pneumonia, and at that time the Covid testing was done.  At that time he was discharged, with symptomatic treatment because he was stable.  The patient has become unstable, with worsening neurologic and respiratory status.  He requires hospitalization for stabilization.  Burnice Oestreicher was evaluated in Emergency Department on 11/21/2019 for the symptoms described in the history of present illness. He was evaluated in the context of the global COVID-19 pandemic, which necessitated consideration that the patient might be at  risk for infection with the SARS-CoV-2 virus that causes COVID-19. Institutional protocols and algorithms that pertain to the evaluation of patients at risk for COVID-19 are in a state of rapid change based on information released by regulatory bodies including the CDC and federal and state organizations. These policies and algorithms were followed during the patient's care in the ED.   CRITICAL  CARE-yes Performed by: Daleen Bo  Nursing Notes Reviewed/ Care Coordinated Applicable Imaging Reviewed Interpretation of Laboratory Data incorporated into ED treatment  1:34 PM-Consult complete with hospitalist. Patient case explained and discussed.  She agrees to admit patient for further evaluation and treatment. Call ended at 1:49 PM  Plan: Admit    Final Clinical Impression(s) / ED Diagnoses Final diagnoses:  Shortness of breath  COVID-19 virus infection  Hypoxia  Confusion    Rx / DC Orders ED Discharge Orders    None       Daleen Bo, MD 11/21/19 1349

## 2019-11-21 NOTE — ED Notes (Signed)
Placed call to ED pharm. She is coming to discuss with EDP and this RN.

## 2019-11-21 NOTE — ED Notes (Signed)
Urine collected and set in room.

## 2019-11-21 NOTE — H&P (Addendum)
History and Physical    [name] WFU:[id number] DOB: Aug 09, 1945 DOA: 11/21/2019  PCP: Biagio Borg, MD  Patient coming from: Home I have personally briefly reviewed patient's old medical records in [place]  Chief Complaint: Cough, confusion and generalized weakness since 1 week.  HPI: [name] is a 74 y.o. male with medical history significant of hypertension, depression, tubular adenoma of colon, chronic thrombocytopenia, CKD stage III presents to emergency department with productive cough, confusion and generalized weakness since 1 week.  Patient presented to ER on 11/18/2019 with balance issues.  CT head and MRI brain came back negative for acute findings.  CT angio came back negative for PE, concern for possible atypical infection.  He tested positive for COVID-19 and he was attempted to be contacted by the infusion clinic for monoclonal antibody infusion but no contact was made so he did not get the infusion on 11/19/2019.   Patient complains of productive cough, chest congestion, generalized weakness, lethargy.  He is very slow to answer questions however he answers appropriately.  He denies fever, chills, headache, blurry vision, chest pain, shortness of breath, palpitation, leg swelling, urinary or bowel changes.  He lives with his brother at home.  No history of smoking, alcohol, illicit drug use.  ED Course: Upon arrival to ED: Patient orthostatic, tachypneic, maintaining oxygen saturation in 90s requiring 4 L of oxygen via nasal cannula, afebrile with no leukocytosis, chest x-ray concerning for multifocal pneumonia, patient received albuterol and remdesivir in ED.  [facility] hospitalist consulted for admission for acute hypoxemic respiratory failure and acute metabolic encephalopathy secondary to underlying COVID-19 infection.  Review of Systems: As per HPI otherwise negative.    Past Medical History:  Diagnosis Date  . Depression 12/30/2013  . Hypertension   .  Personal history of colonic polyps 12/30/2013   Per colonoscopy at [facility] per pt, about sept 2014  . PTSD (post-traumatic stress disorder)    possible [place] vet  . Tubular adenoma of colon 2014    Past Surgical History:  Procedure Laterality Date  . COLONOSCOPY    . MUSCLE RECESSION AND RESECTION Left 12/25/2018   Procedure: INFERIOR OBLIQUE RECESSION LEFT EYE, SUPERIOR OBLIQUE TUCK LEFT EYE;  Surgeon: Gevena Cotton, MD;  Location: [facility];  Service: Ophthalmology;  Laterality: Left;  . UPPER GASTROINTESTINAL ENDOSCOPY       reports that he has never smoked. He has never used smokeless tobacco. He reports that he does not drink alcohol and does not use drugs.  Allergies  Allergen Reactions  . Lisinopril     Angioedema? Patient reports that his tongue and mouth became very swollen, was seen at [facility] for this in 2014.     Family History  Problem Relation Age of Onset  . Hypertension Other   . Colon cancer Neg Hx   . Esophageal cancer Neg Hx   . Stomach cancer Neg Hx   . Rectal cancer Neg Hx     Prior to Admission medications   Medication Sig Start Date End Date Taking? Authorizing Provider  Cholecalciferol (VITAMIN D3) 50 MCG (2000 UT) capsule Take 2,000 Units by mouth daily.    [provider]  citalopram (CELEXA) 10 MG tablet Take 1 tablet (10 mg total) by mouth daily. 10/10/19 10/09/20  Biagio Borg, MD  losartan (COZAAR) 100 MG tablet Take 1 tablet (100 mg total) by mouth daily. 10/10/19   Biagio Borg, MD  Multiple Vitamins-Minerals (MULTIVITAMIN WITH MINERALS) tablet Take 1  tablet by mouth daily.    [provider]  NIFEdipine (PROCARDIA-XL/NIFEDICAL-XL) 30 MG 24 hr tablet Take 1 tablet (30 mg total) by mouth daily. 01/16/19   Biagio Borg, MD  OVER THE COUNTER MEDICATION Take 1 Dose by mouth daily. Minerals    [provider]  polyethylene glycol powder (GLYCOLAX/MIRALAX) 17 GM/SCOOP powder Take 17 g by mouth 2 (two) times daily as  needed. Patient taking differently: Take 8.5-17 g by mouth daily as needed for mild constipation or moderate constipation. To achieve 1-2 complete bowel movements. 09/16/18   Biagio Borg, MD  sildenafil (VIAGRA) 100 MG tablet Take 0.5-1 tablets (50-100 mg total) by mouth daily as needed for erectile dysfunction. Patient not taking: Reported on 11/18/2019 07/30/17   Biagio Borg, MD  tadalafil (CIALIS) 20 MG tablet Take 1 tablet (20 mg total) by mouth daily as needed for erectile dysfunction. Patient not taking: Reported on 11/18/2019 07/30/17   Biagio Borg, MD    Physical Exam: Vitals:   11/21/19 1112 11/21/19 1114 11/21/19 1115 11/21/19 1330  BP:   116/84 129/81  Pulse:  81  73  Resp:  20  (!) 24  Temp:      TempSrc:      SpO2: 100% 99%  99%  Weight:      Height:        Constitutional: NAD, calm, comfortable, on 4 L of oxygen via nasal cannula, he is slow to answer but answers appropriately. Eyes: PERRL, lids and conjunctivae normal ENMT: Mucous membranes are moist. Posterior pharynx clear of any exudate or lesions.Normal dentition.  Neck: normal, supple, no masses, no thyromegaly Respiratory: clear to auscultation bilaterally, no wheezing, no crackles. Normal respiratory effort. No accessory muscle use.  Cardiovascular: Regular rate and rhythm, no murmurs / rubs / gallops. No extremity edema. 2+ pedal pulses. No carotid bruits.  Abdomen: no tenderness, no masses palpated. No hepatosplenomegaly. Bowel sounds positive.  Musculoskeletal: no clubbing / cyanosis. No joint deformity upper and lower extremities. Good ROM, no contractures. Normal muscle tone.  Skin: no rashes, lesions, ulcers. No induration Neurologic: CN 2-12 grossly intact. Sensation intact, DTR normal. Strength 5/5 in all 4.  Psychiatric: Normal judgment and insight. Alert and oriented x 3. Normal mood.    Labs on Admission: I have personally reviewed following labs and imaging studies  CBC: Recent Labs  Lab  11/18/19 1141 11/18/19 1310 11/21/19 1051  WBC 4.0 3.7* 4.8  NEUTROABS 2,560 2.5 3.8  HGB 13.2 13.3 13.4  HCT 40.7 41.6 41.5  MCV 88.9 90.8 89.4  PLT 92* 86* PLATELET CLUMPS NOTED ON SMEAR, UNABLE TO ESTIMATE   Basic Metabolic Panel: Recent Labs  Lab 11/18/19 1139 11/18/19 1310 11/21/19 1051  NA 135 133* 138  K 4.2 4.2 4.1  CL 98 97* 100  CO2 30 28 28   GLUCOSE 107* 103* 120*  BUN 21 19 16   CREATININE 1.74* 1.62* 1.55*  CALCIUM 8.8 8.8* 8.6*   GFR: Estimated Creatinine Clearance: 44.9 mL/min (A) (by C-G formula based on SCr of 1.55 mg/dL (H)). Liver Function Tests: Recent Labs  Lab 11/18/19 1139 11/18/19 1310 11/21/19 1051  AST 53* 61* 101*  ALT 24 31 48*  ALKPHOS  --  55 50  BILITOT 0.6 0.6 0.9  PROT 6.9 6.8 7.0  ALBUMIN  --  3.6 3.3*   No results for input(s): LIPASE, AMYLASE in the last 168 hours. No results for input(s): AMMONIA in the last 168 hours. Coagulation Profile: No  results for input(s): INR, PROTIME in the last 168 hours. Cardiac Enzymes: No results for input(s): CKTOTAL, CKMB, CKMBINDEX, TROPONINI in the last 168 hours. BNP (last 3 results) No results for input(s): PROBNP in the last 8760 hours. HbA1C: No results for input(s): HGBA1C in the last 72 hours. CBG: No results for input(s): GLUCAP in the last 168 hours. Lipid Profile: No results for input(s): CHOL, HDL, LDLCALC, TRIG, CHOLHDL, LDLDIRECT in the last 72 hours. Thyroid Function Tests: No results for input(s): TSH, T4TOTAL, FREET4, T3FREE, THYROIDAB in the last 72 hours. Anemia Panel: Recent Labs    11/21/19 1051  FERRITIN 838*   Urine analysis:    Component Value Date/Time   COLORURINE YELLOW 11/18/2019 1649   APPEARANCEUR CLEAR 11/18/2019 1649   LABSPEC 1.016 11/18/2019 1649   PHURINE 5.0 11/18/2019 1649   GLUCOSEU NEGATIVE 11/18/2019 1649   GLUCOSEU NEGATIVE 01/16/2019 0936   HGBUR MODERATE (A) 11/18/2019 1649   BILIRUBINUR NEGATIVE 11/18/2019 1649   KETONESUR NEGATIVE  11/18/2019 1649   PROTEINUR 100 (A) 11/18/2019 1649   UROBILINOGEN 0.2 01/16/2019 0936   NITRITE NEGATIVE 11/18/2019 1649   LEUKOCYTESUR NEGATIVE 11/18/2019 1649    Radiological Exams on Admission: Portable chest 1 View  Result Date: 11/21/2019 CLINICAL DATA:  COVID positive.  Weakness and shortness of breath. EXAM: PORTABLE CHEST 1 VIEW COMPARISON:  CT 11/18/2019.  Chest x-ray 11/18/2019. FINDINGS: Cardiomegaly. Tortuous thoracic aorta again noted. Prominent bilateral pulmonary infiltrates/edema noted on today's exam. No pleural effusion or pneumothorax. Degenerative change thoracic spine. IMPRESSION: 1.  Cardiomegaly.  No pulmonary venous congestion. 2. Prominent bilateral pulmonary infiltrates/edema noted on today's exam in this known COVID positive patient. Electronically Signed   By: Marcello Moores  Register   On: 11/21/2019 10:51    EKG: Independently reviewed.  Sinus rhythm, no ST elevation or depression noted.  Assessment/Plan Principal Problem:   Acute hypoxemic respiratory failure due to COVID-19 [facility]) Active Problems:   Essential hypertension   Depression   Thrombocytopenia (HCC)   CKD (chronic kidney disease), stage III   Acute metabolic encephalopathy    Acute hypoxemic respiratory failure due to COVID-19 pneumonia: -Patient tested positive for COVID-19 on 11/18/2019.  Presented with worsening cough, congestion and generalized weakness.  Reviewed chest x-ray.  Reviewed CTA from 11/18/2019 which shows patchy peripheral groundglass infiltrates in both lungs consistent with atypical/viral pneumonia such as COVID-19.   -He is afebrile.  No leukocytosis.  Requiring 4 L of oxygen via nasal cannula. -Admit patient to stepdown unit for close monitoring.  On continuous pulse ox.  We will try to wean off of oxygen as tolerated.  On telemetry. -Received albuterol and remdesivir in ED. -Consult pharmacy for remdesivir.  Start on Decadron 6 mg IV daily.  Albuterol every 6 hours, p.o. vitamins and  antitussive -Ordered inflammatory markers. -Patient was told that if COVID-19 pneumonitis gets worse we might potentially use Actemra off label, he denies any known history of TB or hepatitis, understands the risk and benefits and wants to proceed with Actemra treatment if required. -Monitor vitals closely.  Acute metabolic encephalopathy: -Could be secondary to underlying COVID-19 infection.  Recent CT head/MRI brain done on 11/18/2019 came back negative for acute findings (shows mild to moderate burden of chronic microhemorrhages which may reflect chronic hypertension or a mild large angiopathy) -Check B12, folate, TSH, ammonia, RPR, UDS, UA, ethanol level -We will keep him n.p.o. -Neurochecks. -Consult PT/OT -On fall/aspiration precautions  Elevated liver enzymes: AST: 101, ALT: 48 -Likely secondary to COVID-19 infection -Repeat  CMP tomorrow AM.  Check acute hepatitis panel -Consider right upper quadrant ultrasound if no improvement in liver enzyme level tomorrow AM.  Hypertension: Patient orthostatic upon arrival to ED -We will hold losartan and nifedipine for now.  Depression: Stable -Continue Celexa  Thrombocytopenia: Chronic -No signs of active bleeding. -Repeat CBC tomorrow a.m.  CKD stage IIIa: At baseline -Continue to monitor.  DVT prophylaxis: SCD, no chemical anticoagulation due to thrombocytopenia Code Status: Full code Family Communication: None present at bedside.  Plan of care discussed with patient in length and he verbalized understanding and agreed with it. Disposition Plan: Home in 2 to 3 days Consults called: None  admission status: Inpatient   Mckinley Jewel MD [facility]  If 7PM-7AM, please contact night-coverage [email or web address] Password [email or web address]  11/21/2019, 1:54 PM

## 2019-11-22 DIAGNOSIS — I10 Essential (primary) hypertension: Secondary | ICD-10-CM

## 2019-11-22 DIAGNOSIS — G9341 Metabolic encephalopathy: Secondary | ICD-10-CM

## 2019-11-22 DIAGNOSIS — N1831 Chronic kidney disease, stage 3a: Secondary | ICD-10-CM

## 2019-11-22 LAB — COMPREHENSIVE METABOLIC PANEL
ALT: 49 U/L — ABNORMAL HIGH (ref 0–44)
AST: 84 U/L — ABNORMAL HIGH (ref 15–41)
Albumin: 3 g/dL — ABNORMAL LOW (ref 3.5–5.0)
Alkaline Phosphatase: 51 U/L (ref 38–126)
Anion gap: 10 (ref 5–15)
BUN: 17 mg/dL (ref 8–23)
CO2: 27 mmol/L (ref 22–32)
Calcium: 8.4 mg/dL — ABNORMAL LOW (ref 8.9–10.3)
Chloride: 100 mmol/L (ref 98–111)
Creatinine, Ser: 1.24 mg/dL (ref 0.61–1.24)
GFR calc Af Amer: >60 mL/min (ref >60)
GFR calc non Af Amer: 57 mL/min — ABNORMAL LOW (ref >60)
Glucose, Bld: 138 mg/dL — ABNORMAL HIGH (ref 70–99)
Potassium: 4.6 mmol/L (ref 3.5–5.1)
Sodium: 137 mmol/L (ref 135–145)
Total Bilirubin: 0.7 mg/dL (ref 0.3–1.2)
Total Protein: 6.8 g/dL (ref 6.5–8.1)

## 2019-11-22 LAB — CBC WITH DIFFERENTIAL/PLATELET
Abs Immature Granulocytes: 0.02 10*3/uL (ref 0.00–0.07)
Basophils Absolute: 0 10*3/uL (ref 0.0–0.1)
Basophils Relative: 0 %
Eosinophils Absolute: 0 10*3/uL (ref 0.0–0.5)
Eosinophils Relative: 0 %
HCT: 41.7 % (ref 39.0–52.0)
Hemoglobin: 13.3 g/dL (ref 13.0–17.0)
Immature Granulocytes: 1 %
Lymphocytes Relative: 9 %
Lymphs Abs: 0.4 10*3/uL — ABNORMAL LOW (ref 0.7–4.0)
MCH: 28.7 pg (ref 26.0–34.0)
MCHC: 31.9 g/dL (ref 30.0–36.0)
MCV: 89.9 fL (ref 80.0–100.0)
Monocytes Absolute: 0.3 10*3/uL (ref 0.1–1.0)
Monocytes Relative: 7 %
Neutro Abs: 3.7 10*3/uL (ref 1.7–7.7)
Neutrophils Relative %: 83 %
Platelets: 114 10*3/uL — ABNORMAL LOW (ref 150–400)
RBC: 4.64 MIL/uL (ref 4.22–5.81)
RDW: 13.2 % (ref 11.5–15.5)
WBC: 4.4 10*3/uL (ref 4.0–10.5)
nRBC: 0 % (ref 0.0–0.2)

## 2019-11-22 LAB — C-REACTIVE PROTEIN: CRP: 10.9 mg/dL — ABNORMAL HIGH (ref <1.0)

## 2019-11-22 LAB — FERRITIN: Ferritin: 883 ng/mL — ABNORMAL HIGH (ref 24–336)

## 2019-11-22 LAB — D-DIMER, QUANTITATIVE: D-Dimer, Quant: 1.41 ug/mL-FEU — ABNORMAL HIGH (ref 0.00–0.50)

## 2019-11-22 LAB — MAGNESIUM: Magnesium: 2.4 mg/dL (ref 1.7–2.4)

## 2019-11-22 LAB — RPR
RPR Ser Ql: REACTIVE — AB
RPR Titer: 1:2 {titer}

## 2019-11-22 LAB — PHOSPHORUS: Phosphorus: 4.1 mg/dL (ref 2.5–4.6)

## 2019-11-22 MED ORDER — AEROCHAMBER PLUS FLO-VU LARGE MISC
Status: AC
  Administered 2019-11-22: 1
  Filled 2019-11-22: qty 1

## 2019-11-22 MED ORDER — ENOXAPARIN SODIUM 40 MG/0.4ML ~~LOC~~ SOLN
40.0000 mg | SUBCUTANEOUS | Status: DC
  Administered 2019-11-22 – 2019-12-09 (×17): 40 mg via SUBCUTANEOUS
  Filled 2019-11-22 (×19): qty 0.4

## 2019-11-22 NOTE — ED Notes (Signed)
Pt is slow to respond, but appropriate, states he needs something to help cough up mucous.

## 2019-11-22 NOTE — Evaluation (Signed)
Physical Therapy Evaluation Patient Details Name: [name] MRN: [id number] DOB: Oct 08, 1945 Today's Date: 11/22/2019   History of Present Illness  74yo male presenting with productive cough, AMS, and weakness of 1 week in duration. CTH and MRI both clear. Negative for PE. Found to be Covid positive and admitted for acute hypoxemic respiratory failure due to Covid infection. PMH HTN, PTSD  Clinical Impression   Patient received in bed, lethargic and confused with A&Ox2 but pleasant and cooperative with PT. Able to roll side to side with maxA to roll to the left and MinA to roll to the right, reports RPE 3-4/10 but with significant increase in WOB even with bed level activities. Session limited by reduced functional activity tolerance and lethargy- will require +2 to safely progress to EOB/OOB. VSS on 3LPM O2. Left on ED stretcher positioned to comfort with all needs met this morning. Will very likely require SNF and 24/7A moving forward.     Follow Up Recommendations SNF;Supervision/Assistance - 24 hour    Equipment Recommendations  Other (comment) (defer to next venue)    Recommendations for Other Services       Precautions / Restrictions Precautions Precautions: Fall;Other (comment) Precaution Comments: confusion, poor functional activity tolerance      Mobility  Bed Mobility Overal bed mobility: Needs Assistance Bed Mobility: Rolling Rolling: Min assist;Max assist         General bed mobility comments: MaxA to roll to the left, MinA to roll to the right with increased WOB but RPE 3-4/10, VSS  Transfers                 General transfer comment: deferred- safety/will need +2 to progress mobility  Ambulation/Gait             General Gait Details: deferred- safety/will need +2 to progress mobility  Stairs            Wheelchair Mobility    Modified Rankin (Stroke Patients Only)       Balance                                              Pertinent Vitals/Pain Pain Assessment: No/denies pain    Home Living Family/patient expects to be discharged to:: Private residence                 Additional Comments: unsure- patient A&Ox2 and poor historian, tells me he does not use a cane even though there is a personal cane sitting in the corner of his ED room. No family present due to covid/airborne restrictions.    Prior Function           Comments: unsure- patient poor historian and A&Ox2     Hand Dominance        Extremity/Trunk Assessment   Upper Extremity Assessment Upper Extremity Assessment: Generalized weakness    Lower Extremity Assessment Lower Extremity Assessment: Generalized weakness    Cervical / Trunk Assessment Cervical / Trunk Assessment: Normal  Communication   Communication: No difficulties  Cognition Arousal/Alertness: Lethargic Behavior During Therapy: Flat affect Overall Cognitive Status: No family/caregiver present to determine baseline cognitive functioning Area of Impairment: Orientation;Attention;Memory;Following commands;Safety/judgement;Awareness;Problem solving                 Orientation Level: Disoriented to;Time;Situation Current Attention Level: Focused Memory: Decreased short-term memory;Decreased recall of precautions Following Commands: Follows one  step commands inconsistently;Follows one step commands with increased time Safety/Judgement: Decreased awareness of safety;Decreased awareness of deficits Awareness: Intellectual Problem Solving: Slow processing;Decreased initiation;Difficulty sequencing;Requires verbal cues;Requires tactile cues        General Comments General comments (skin integrity, edema, etc.): unable to get to EOB safely with +1 assist at eval due to poor cognition, deconditioning, and increased WOB even with rolling although RPE at 3-4/10 subjectively. VSS on 3LPM O2.    Exercises     Assessment/Plan    PT Assessment  Patient needs continued PT services  PT Problem List Decreased strength;Decreased cognition;Decreased activity tolerance;Decreased safety awareness;Decreased balance;Decreased mobility;Decreased coordination;Cardiopulmonary status limiting activity;Decreased knowledge of precautions       PT Treatment Interventions DME instruction;Balance training;Gait training;Neuromuscular re-education;Stair training;Cognitive remediation;Functional mobility training;Patient/family education;Therapeutic activities;Wheelchair mobility training;Therapeutic exercise    PT Goals (Current goals can be found in the Care Plan section)  Acute Rehab PT Goals PT Goal Formulation: Patient unable to participate in goal setting Time For Goal Achievement: 12/06/19 Potential to Achieve Goals: Fair    Frequency Min 2X/week   Barriers to discharge        Co-evaluation               AM-PAC PT "6 Clicks" Mobility  Outcome Measure Help needed turning from your back to your side while in a flat bed without using bedrails?: A Lot Help needed moving from lying on your back to sitting on the side of a flat bed without using bedrails?: A Lot Help needed moving to and from a bed to a chair (including a wheelchair)?: Total Help needed standing up from a chair using your arms (e.g., wheelchair or bedside chair)?: Total Help needed to walk in hospital room?: Total Help needed climbing 3-5 steps with a railing? : Total 6 Click Score: 8    End of Session Equipment Utilized During Treatment: Gait belt Activity Tolerance: Patient limited by lethargy Patient left: in bed;with call bell/phone within reach (ED stretcher)   PT Visit Diagnosis: Unsteadiness on feet (R26.81);Difficulty in walking, not elsewhere classified (R26.2);Muscle weakness (generalized) (M62.81)    Time: 1610-9604 PT Time Calculation (min) (ACUTE ONLY): 10 min   Charges:   PT Evaluation $PT Eval Moderate Complexity: 1 Mod          Windell Norfolk, DPT, PN1   Supplemental Physical Therapist [place]    Pager [phone] Acute Rehab Office [phone]

## 2019-11-22 NOTE — ED Notes (Signed)
Pt resting on stretcher, denies any needs at this time. Pt attached to cardiac monitor , bp and pulse ox, will continue to monitor

## 2019-11-22 NOTE — ED Notes (Signed)
Pt placed on hospital bed- spoke with sister on phone, updates given

## 2019-11-22 NOTE — Progress Notes (Signed)
PROGRESS NOTE                                                                                                                                                                                                             Patient Demographics:    [name], is a 74 y.o. male, DOB - 06-22-1945, JIR:[id number]  Outpatient Primary MD for the patient is Biagio Borg, MD   Admit date - 11/21/2019   LOS - 1  Chief Complaint  Patient presents with  . Covid Exposure  . Weakness       Brief Narrative: Patient is a 74 y.o. male with PMHx of HTN, depression, chronic thrombocytopenia, CKD stage IIIa-tested positive for COVID-19 on 8/3-presented to the ED on 8/6 with generalized weakness, cough, confusion-upon further evaluation-he was found to have acute hypoxic respiratory failure secondary to COVID-19 pneumonia.  See below for further details.  Significant Events: 8/6>> Admit to [facility] for metabolic encephalopathy, acute hypoxic respiratory failure secondary to COVID-19 pneumonia  Significant studies: 8/3>>Chest x-ray: No focal airspace disease 8/3>> CTA chest: No PE/dissection-patchy peripheral groundglass opacities 8/3>> CT head: No acute intracranial findings 8/3>> MRI brain: No evidence of recent infarction, hemorrhage or mass 8/6>> chest x-ray: Bilateral pulmonary infiltrates  COVID-19 medications: Steroids: 8/6>> Remdesivir: 8/6>>  Antibiotics: None  Microbiology data: 8/6 >>blood culture: None  Procedures: None  Consults: None  DVT prophylaxis: SCDs Start: 11/21/19 1351 Prophylactic Lovenox-watch platelets.    Subjective:    [name] today is responding to questions appropriately-although somewhat slow.  He denies any chest pain or shortness of breath.   Assessment  & Plan :   Acute Hypoxic Resp Failure due to Covid 19 Viral pneumonia: Improved-down to 2 L of oxygen.  CRP remains significantly  elevated.  Continue steroids/remdesivir.  Per H&P-has already consented to Actemra if hypoxemia worsens.  Fever: afebrile O2 requirements:  SpO2: 98 % O2 Flow Rate (L/min): 2 L/min FiO2 (%): (!) 3 %   COVID-19 Labs: Recent Labs    11/21/19 1051 11/21/19 1516 11/22/19 0438  DDIMER 1.25*  --  1.41*  FERRITIN 838* 930* 883*  LDH  --  388*  --   CRP  --  9.5* 10.9*       Component Value Date/Time   BNP 73.2 11/21/2019 1548    Recent Labs  Lab  11/21/19 1516  PROCALCITON <0.10    Lab Results  Component Value Date   SARSCOV2NAA POSITIVE (A) 11/18/2019   SARSCOV2NAA NOT DETECTED 12/21/2018     Prone/Incentive Spirometry: encouraged  incentive spirometry use 3-4/hour.  Acute metabolic encephalopathy: Suspect secondary to hypoxemia and AKI-improving with supportive care.Per patient's friend who I spoke to over the phone-patient has improved significantly-he apparently called her this afternoon.   AKI on CKD stage IIIa: Likely hemodynamically mediated-improving.  Follow electrolytes.  Transaminitis: Mild-likely related to COVID-19-follow.  Thrombocytopenia: Appears to be a chronic issue-watch closely  HTN: BP stable-continue to hold antihypertensives.  Depression: Continue Celexa  Deconditioning/debility: Secondary to acute illness-PT eval appreciated-recommendations of SNF.  Obesity: Estimated body mass index is 31.32 kg/m as calculated from the following:   Height as of this encounter: 5\' 7"  (1.702 m).   Weight as of this encounter: 90.7 kg.    ABG:    Component Value Date/Time   TCO2 28 01/18/2010 1531    Vent Settings: N/A FiO2 (%):  [3 %] 3 %  Condition - Stable  Family Communication  :  Friend-and Sister (conference call) updated over the phone 8/7  Code Status :  Full Code  Diet :  Diet Order            Diet heart healthy/carb modified Room service appropriate? Yes; Fluid consistency: Thin  Diet effective now                   Disposition Plan  :   Status is: Inpatient  Remains inpatient appropriate because:Inpatient level of care appropriate due to severity of illness   Dispo: The patient is from: Home              Anticipated d/c is to: Home              Anticipated d/c date is: 3 days              Patient currently is not medically stable to d/c.   Barriers to discharge: Hypoxia requiring O2 supplementation/complete 5 days of IV Remdesivir  Antimicorbials  :    Anti-infectives (From admission, onward)   Start     Dose/Rate Route Frequency Ordered Stop   11/22/19 1000  remdesivir 100 mg in sodium chloride 0.9 % 100 mL IVPB     Discontinue    "Followed by" Linked Group Details   100 mg 200 mL/hr over 30 Minutes Intravenous Daily 11/21/19 1027 11/26/19 0959   11/22/19 1000  remdesivir 100 mg in sodium chloride 0.9 % 100 mL IVPB  Status:  Discontinued       "Followed by" Linked Group Details   100 mg 200 mL/hr over 30 Minutes Intravenous Daily 11/21/19 1353 11/21/19 1354   11/21/19 1400  remdesivir 200 mg in sodium chloride 0.9% 250 mL IVPB  Status:  Discontinued       "Followed by" Linked Group Details   200 mg 580 mL/hr over 30 Minutes Intravenous Once 11/21/19 1353 11/21/19 1354   11/21/19 1130  remdesivir 200 mg in sodium chloride 0.9% 250 mL IVPB       "Followed by" Linked Group Details   200 mg 580 mL/hr over 30 Minutes Intravenous Once 11/21/19 1027 11/21/19 1338      Inpatient Medications  Scheduled Meds: . albuterol  2 puff Inhalation Q6H  . vitamin C  500 mg Oral Daily  . citalopram  10 mg Oral Daily  . dexamethasone (DECADRON) injection  6 mg  Intravenous Daily  . zinc sulfate  220 mg Oral Daily   Continuous Infusions: . sodium chloride    . remdesivir 100 mg in NS 100 mL Stopped (11/22/19 1051)   PRN Meds:.sodium chloride, acetaminophen, chlorpheniramine-HYDROcodone, guaiFENesin-dextromethorphan, ondansetron **OR** ondansetron (ZOFRAN) IV, polyethylene glycol   Time Spent  in minutes  25  See all Orders from today for further details   Oren Binet M.D on 11/22/2019 at 5:21 PM  To page go to [email or web address] - use universal password  [facility] -  Office  [phone]    Objective:   Vitals:   11/22/19 0400 11/22/19 0442 11/22/19 0855 11/22/19 1531  BP: 100/72  118/78   Pulse: 65  72   Resp: (!) 24  (!) 22   Temp:  98.5 F (36.9 C) 98.4 F (36.9 C)   TempSrc:  Oral Oral   SpO2: 99%  99% 98%  Weight:      Height:        Wt Readings from Last 3 Encounters:  11/21/19 90.7 kg  11/18/19 86.2 kg  10/10/19 90.7 kg     Intake/Output Summary (Last 24 hours) at 11/22/2019 1721 Last data filed at 11/22/2019 1051 Gross per 24 hour  Intake 100 ml  Output --  Net 100 ml     Physical Exam Gen Exam:Alert awake-not in any distress. Slow to respond HEENT:atraumatic, normocephalic Chest: B/L clear to auscultation anteriorly CVS:S1S2 regular Abdomen:soft non tender, non distended Extremities:no edema Neurology: Non focal-but with significant generalized weakness. Skin: no rash   Data Review:    CBC Recent Labs  Lab 11/18/19 1141 11/18/19 1310 11/21/19 1051 11/22/19 0438  WBC 4.0 3.7* 4.8 4.4  HGB 13.2 13.3 13.4 13.3  HCT 40.7 41.6 41.5 41.7  PLT 92* 86* PLATELET CLUMPS NOTED ON SMEAR, UNABLE TO ESTIMATE 114*  MCV 88.9 90.8 89.4 89.9  MCH 28.8 29.0 28.9 28.7  MCHC 32.4 32.0 32.3 31.9  RDW 13.1 13.2 13.2 13.2  LYMPHSABS 1,012 0.9 0.5* 0.4*  MONOABS  --  0.4 0.4 0.3  EOSABS 0* 0.0 0.0 0.0  BASOSABS 12 0.0 0.0 0.0    Chemistries  Recent Labs  Lab 11/18/19 1139 11/18/19 1310 11/21/19 1051 11/21/19 1516 11/22/19 0438  NA 135 133* 138 139 137  K 4.2 4.2 4.1 4.4 4.6  CL 98 97* 100 101 100  CO2 30 28 28 25 27   GLUCOSE 107* 103* 120* 122* 138*  BUN 21 19 16 14 17   CREATININE 1.74* 1.62* 1.55* 1.28* 1.24  CALCIUM 8.8 8.8* 8.6* 8.7* 8.4*  MG  --   --   --   --  2.4  AST 53* 61* 101* 101* 84*  ALT 24 31 48* 53* 49*   ALKPHOS  --  55 50 53 51  BILITOT 0.6 0.6 0.9 0.8 0.7   ------------------------------------------------------------------------------------------------------------------ No results for input(s): CHOL, HDL, LDLCALC, TRIG, CHOLHDL, LDLDIRECT in the last 72 hours.  No results found for: HGBA1C ------------------------------------------------------------------------------------------------------------------ Recent Labs    11/21/19 1516  TSH 0.697   ------------------------------------------------------------------------------------------------------------------ Recent Labs    11/21/19 1516 11/22/19 0438  VITAMINB12 731  --   FOLATE 26.9  --   FERRITIN 930* 883*    Coagulation profile No results for input(s): INR, PROTIME in the last 168 hours.  Recent Labs    11/21/19 1051 11/22/19 0438  DDIMER 1.25* 1.41*    Cardiac Enzymes No results for input(s): CKMB, TROPONINI, MYOGLOBIN in the last 168 hours.  Invalid input(s): CK ------------------------------------------------------------------------------------------------------------------  Component Value Date/Time   BNP 73.2 11/21/2019 1548    Micro Results Recent Results (from the past 240 hour(s))  Urine Culture     Status: None   Collection Time: 11/18/19 11:41 AM   Specimen: Urine  Result Value Ref Range Status   Source: URINE  Final   Status: FINAL  Final   Result: No Growth  Final  SARS Coronavirus 2 by RT PCR (hospital order, performed in [facility] hospital lab) Nasopharyngeal Nasopharyngeal Swab     Status: Abnormal   Collection Time: 11/18/19  9:09 PM   Specimen: Nasopharyngeal Swab  Result Value Ref Range Status   SARS Coronavirus 2 POSITIVE (A) NEGATIVE Final    Comment: RESULT CALLED TO, READ BACK BY AND VERIFIED WITH: EMAIL SENT TO L,BERDIK @2236  11/18/19 EB Performed at [facility], [place] [address], [place], [place] [address]   Culture, blood (Routine X 2) w Reflex to ID Panel     Status:  None (Preliminary result)   Collection Time: 11/21/19  4:43 PM   Specimen: BLOOD  Result Value Ref Range Status   Specimen Description BLOOD SITE NOT SPECIFIED  Final   Special Requests   Final    BOTTLES DRAWN AEROBIC AND ANAEROBIC Blood Culture adequate volume   Culture   Final    NO GROWTH < 24 HOURS Performed at [facility], [place] [address], [place], [place] [address]    Report Status PENDING  Incomplete  Culture, blood (Routine X 2) w Reflex to ID Panel     Status: None (Preliminary result)   Collection Time: 11/21/19  7:30 PM   Specimen: BLOOD  Result Value Ref Range Status   Specimen Description BLOOD BLOOD LEFT FOREARM  Final   Special Requests   Final    BOTTLES DRAWN AEROBIC ONLY Blood Culture results may not be optimal due to an inadequate volume of blood received in culture bottles   Culture   Final    NO GROWTH < 12 HOURS Performed at [facility], [place] [address], [place], [place] [address]    Report Status PENDING  Incomplete    Radiology Reports CT Head Wo Contrast  Result Date: 11/18/2019 CLINICAL DATA:  Neurologic deficit. EXAM: CT HEAD WITHOUT CONTRAST TECHNIQUE: Contiguous axial images were obtained from the base of the skull through the vertex without intravenous contrast. COMPARISON:  None. FINDINGS: Brain: Age related cerebral atrophy, ventriculomegaly and advanced periventricular white matter disease. The ventricles are in the midline without mass effect or shift. No extra-axial fluid collections are identified. No CT findings for acute hemispheric infarction or intracranial hemorrhage. No mass lesions are identified. The brainstem and cerebellum are grossly normal. Vascular: Scattered vascular calcifications. No aneurysm or hyperdense vessels. Skull: No skull fracture or bone lesions. Sinuses/Orbits: The paranasal sinuses and mastoid air cells are clear. The globes are intact. Other: Mildly prominent left temporalis muscle. No scalp hematoma or obvious  laceration. IMPRESSION: 1. Age related cerebral atrophy, ventriculomegaly and advanced periventricular white matter disease. 2. No acute intracranial findings or mass lesions. Electronically Signed   By: Marijo Sanes M.D.   On: 11/18/2019 18:16   MR BRAIN WO CONTRAST  Result Date: 11/18/2019 CLINICAL DATA:  Off balance EXAM: MRI HEAD WITHOUT CONTRAST TECHNIQUE: Multiplanar, multiecho pulse sequences of the brain and surrounding structures were obtained without intravenous contrast. COMPARISON:  None. FINDINGS: Brain: There is no acute infarction or intracranial hemorrhage. There is no intracranial mass, mass effect, or edema.  There is no hydrocephalus or extra-axial fluid collection. Prominence of the ventricles and sulci reflects generalized parenchymal volume loss. Patchy and confluent areas of T2 hyperintensity in the supratentorial white matter are nonspecific but may reflect moderate chronic microvascular ischemic changes. Scattered foci of susceptibility hypointensity in the peripheral cerebral hemispheres most compatible with chronic microhemorrhages. Vascular: Major vessel flow voids at the skull base are preserved. Skull and upper cervical spine: Normal marrow signal is preserved. Sinuses/Orbits: Mild mucosal thickening.  Orbits are unremarkable. Other: Sella is unremarkable.  Mastoid air cells are clear. IMPRESSION: No evidence of recent infarction, hemorrhage, or mass. Moderate chronic microvascular ischemic changes. Mild to moderate burden of chronic microhemorrhages, which may reflect chronic hypertension or amyloid angiopathy. Electronically Signed   By: Macy Mis M.D.   On: 11/18/2019 19:41   Portable chest 1 View  Result Date: 11/21/2019 CLINICAL DATA:  COVID positive.  Weakness and shortness of breath. EXAM: PORTABLE CHEST 1 VIEW COMPARISON:  CT 11/18/2019.  Chest x-ray 11/18/2019. FINDINGS: Cardiomegaly. Tortuous thoracic aorta again noted. Prominent bilateral pulmonary  infiltrates/edema noted on today's exam. No pleural effusion or pneumothorax. Degenerative change thoracic spine. IMPRESSION: 1.  Cardiomegaly.  No pulmonary venous congestion. 2. Prominent bilateral pulmonary infiltrates/edema noted on today's exam in this known COVID positive patient. Electronically Signed   By: Marcello Moores  Register   On: 11/21/2019 10:51   DG Chest Port 1 View  Result Date: 11/18/2019 CLINICAL DATA:  Hypotension, cough. EXAM: PORTABLE CHEST 1 VIEW COMPARISON:  11/26/2018 chest radiograph. FINDINGS: Hypoinflated lungs. No pneumothorax or pleural effusion. No focal airspace opacity. Increased conspicuity of the cardiomediastinal silhouette. Tortuous thoracic aorta. Multilevel spondylosis. No acute osseous abnormality. IMPRESSION: No focal airspace disease. Increased conspicuity of the cardiomediastinal silhouette compared to prior exam. While this may be artifactual accentuation secondary to hypoinflation and AP technique, consider further evaluation with CTA chest given the presence of hypotension. These results were called by telephone at the time of interpretation on 11/18/2019 at 3:54 pm to provider [facility] , who verbally acknowledged these results. Electronically Signed   By: Primitivo Gauze M.D.   On: 11/18/2019 15:57   CT ANGIO CHEST AORTA W/CM & OR WO/CM  Result Date: 11/18/2019 CLINICAL DATA:  Hypertension.  Chest pain. EXAM: CT ANGIOGRAPHY CHEST WITH CONTRAST TECHNIQUE: Multidetector CT imaging of the chest was performed using the standard protocol during bolus administration of intravenous contrast. Multiplanar CT image reconstructions and MIPs were obtained to evaluate the vascular anatomy. CONTRAST:  174mL OMNIPAQUE IOHEXOL 350 MG/ML SOLN COMPARISON:  Chest x-ray 06/2019 FINDINGS: Cardiovascular: The heart is normal in size. No pericardial effusion. Mild tortuosity of the thoracic aorta but no aneurysm or dissection. Scattered atherosclerotic calcifications. The branch vessels  are patent. Minimal scattered coronary artery calcifications. The pulmonary arterial tree is fairly well opacified. No filling defects to suggest pulmonary embolism. Mediastinum/Nodes: No mediastinal or hilar mass or lymphadenopathy. The esophagus is grossly normal. Lungs/Pleura: Underlying mild emphysematous changes with some areas of pulmonary scarring. Patchy peripheral ground-glass infiltrates in both lungs most consistent with atypical/viral pneumonia such as COVID pneumonia. Recommend correlation with COVID 19 status. No worrisome pulmonary lesions or pleural effusions. No pulmonary edema. Moderate eventration of the left hemidiaphragm noted with some overlying vascular crowding and atelectasis. Upper Abdomen: No significant upper abdominal findings. Moderate motion artifact. Musculoskeletal: No chest wall mass, supraclavicular or axillary adenopathy. The thyroid gland is unremarkable. The bony thorax is intact. Review of the MIP images confirms the above findings. IMPRESSION: 1. No CT  findings for pulmonary embolism. 2. Mild tortuosity of the thoracic aorta but no aneurysm or dissection. 3. Patchy peripheral ground-glass infiltrates in both lungs most consistent with atypical/viral pneumonia such as COVID pneumonia. Recommend correlation with COVID 19 status. 4. No mediastinal or hilar mass or adenopathy. 5. Emphysema and aortic atherosclerosis. Aortic Atherosclerosis (ICD10-I70.0) and Emphysema (ICD10-J43.9). Electronically Signed   By: Marijo Sanes M.D.   On: 11/18/2019 18:22

## 2019-11-23 LAB — CBC WITH DIFFERENTIAL/PLATELET
Abs Immature Granulocytes: 0.07 10*3/uL (ref 0.00–0.07)
Basophils Absolute: 0 10*3/uL (ref 0.0–0.1)
Basophils Relative: 0 %
Eosinophils Absolute: 0 10*3/uL (ref 0.0–0.5)
Eosinophils Relative: 0 %
HCT: 41.6 % (ref 39.0–52.0)
Hemoglobin: 13.6 g/dL (ref 13.0–17.0)
Immature Granulocytes: 1 %
Lymphocytes Relative: 7 %
Lymphs Abs: 0.4 10*3/uL — ABNORMAL LOW (ref 0.7–4.0)
MCH: 29.1 pg (ref 26.0–34.0)
MCHC: 32.7 g/dL (ref 30.0–36.0)
MCV: 89.1 fL (ref 80.0–100.0)
Monocytes Absolute: 0.5 10*3/uL (ref 0.1–1.0)
Monocytes Relative: 9 %
Neutro Abs: 4.8 10*3/uL (ref 1.7–7.7)
Neutrophils Relative %: 83 %
Platelets: 144 10*3/uL — ABNORMAL LOW (ref 150–400)
RBC: 4.67 MIL/uL (ref 4.22–5.81)
RDW: 13.2 % (ref 11.5–15.5)
WBC: 5.8 10*3/uL (ref 4.0–10.5)
nRBC: 0 % (ref 0.0–0.2)

## 2019-11-23 LAB — COMPREHENSIVE METABOLIC PANEL
ALT: 44 U/L (ref 0–44)
AST: 60 U/L — ABNORMAL HIGH (ref 15–41)
Albumin: 2.7 g/dL — ABNORMAL LOW (ref 3.5–5.0)
Alkaline Phosphatase: 49 U/L (ref 38–126)
Anion gap: 11 (ref 5–15)
BUN: 31 mg/dL — ABNORMAL HIGH (ref 8–23)
CO2: 27 mmol/L (ref 22–32)
Calcium: 8.5 mg/dL — ABNORMAL LOW (ref 8.9–10.3)
Chloride: 102 mmol/L (ref 98–111)
Creatinine, Ser: 1.36 mg/dL — ABNORMAL HIGH (ref 0.61–1.24)
GFR calc Af Amer: 59 mL/min — ABNORMAL LOW (ref >60)
GFR calc non Af Amer: 51 mL/min — ABNORMAL LOW (ref >60)
Glucose, Bld: 151 mg/dL — ABNORMAL HIGH (ref 70–99)
Potassium: 4.5 mmol/L (ref 3.5–5.1)
Sodium: 140 mmol/L (ref 135–145)
Total Bilirubin: 0.7 mg/dL (ref 0.3–1.2)
Total Protein: 6.1 g/dL — ABNORMAL LOW (ref 6.5–8.1)

## 2019-11-23 LAB — C-REACTIVE PROTEIN: CRP: 7.3 mg/dL — ABNORMAL HIGH (ref <1.0)

## 2019-11-23 LAB — D-DIMER, QUANTITATIVE: D-Dimer, Quant: 2.21 ug/mL-FEU — ABNORMAL HIGH (ref 0.00–0.50)

## 2019-11-23 LAB — FERRITIN: Ferritin: 990 ng/mL — ABNORMAL HIGH (ref 24–336)

## 2019-11-23 NOTE — Progress Notes (Addendum)
Patient demonstrated using the incentive spirometer and the flutter valve.  Encouraging patient to use again in one hour and every hour during the day as well.

## 2019-11-23 NOTE — Evaluation (Signed)
 Occupational Therapy Evaluation Patient Details Name: [name] MRN: [id number] DOB: 13-Jul-1945 Today's Date: 11/23/2019    History of Present Illness 74yo male presenting with productive cough, AMS, and weakness of 1 week in duration. CTH and MRI both clear. Negative for PE. Found to be Covid positive and admitted for acute hypoxemic respiratory failure due to Covid infection. PMH HTN, PTSD   Clinical Impression   PTA, pt reports living with brother and Independent with ADLs/mobility without AD. Unsure of PLOF accuracy as pt confused and A&Ox1 today. Pt presents with diagnoses above and deficits in cognition, strength, standing balance, and cardiopulmonary tolerance. Pt received on 2 L O2, stats 89% at rest. Pt overall Min A for sit to stand from recliner without AD, requiring some assistance to steady during dynamic tasks. While standing, pt experienced urine incontinence requiring assist for cleanup. Pt overall Min A for UB ADLs and Mod A for LB ADLs. Pt desats to 83% on 2 L O2 during ADLs, endorsed SOB/fatigue and required 3 minutes seated rest break to recover to 88%. Recommend SNF for short term rehab prior to return home.     Follow Up Recommendations  SNF;Supervision/Assistance - 24 hour    Equipment Recommendations  3 in 1 bedside commode    Recommendations for Other Services       Precautions / Restrictions Precautions Precautions: Fall;Other (comment) Precaution Comments: confusion Restrictions Weight Bearing Restrictions: No      Mobility Bed Mobility               General bed mobility comments: up in recliner on entry  Transfers Overall transfer level: Needs assistance Equipment used: None;Rolling walker (2 wheeled) Transfers: Sit to/from Stand Sit to Stand: Min assist         General transfer comment: Min A for sit to stand without AD. Pt with decreased stability and requires hands on assist for dynamic standing challenges. Placed RW in front of pt  during peri care,  but pt did not use RW    Balance Overall balance assessment: Needs assistance Sitting-balance support: No upper extremity supported;Feet supported Sitting balance-Leahy Scale: Fair     Standing balance support: No upper extremity supported;During functional activity Standing balance-Leahy Scale: Poor Standing balance comment: Min A to maintain standing balance at recliner chair.                            ADL either performed or assessed with clinical judgement   ADL Overall ADL's : Needs assistance/impaired Eating/Feeding: Set up;Sitting Eating/Feeding Details (indicate cue type and reason): Setup for self feeding, cued to initiate task Grooming: Supervision/safety;Sitting   Upper Body Bathing: Supervision/ safety;Sitting   Lower Body Bathing: Moderate assistance;Sit to/from stand Lower Body Bathing Details (indicate cue type and reason): Overall Mod A, required assistance to maintain safety in standing for peri care after urine incontinence, cues for thoroughness Upper Body Dressing : Minimal assistance;Sitting Upper Body Dressing Details (indicate cue type and reason): Min A to don clean hospital gown Lower Body Dressing: Moderate assistance;Sit to/from stand Lower Body Dressing Details (indicate cue type and reason): Mod A for overall for maintaining steadiness in standing. Mod A for socks seated in recliner      Toileting- Clothing Manipulation and Hygiene: Moderate assistance;Sit to/from stand Toileting - Clothing Manipulation Details (indicate cue type and reason): Mod A for peri care/LB bathing after incontinence. Able to urinate some in device with assistance to hold at anterior region  General ADL Comments: Pt with decreased endurance, cardiopulmonary tolerance, strength, cognition, standing balance impacting ability to complete ADLs/transfers without physical assist     Vision Baseline Vision/History: Wears glasses Wears Glasses:  Reading only Patient Visual Report: No change from baseline Vision Assessment?: No apparent visual deficits     Perception     Praxis      Pertinent Vitals/Pain Pain Assessment: No/denies pain     Hand Dominance Right   Extremity/Trunk Assessment Upper Extremity Assessment Upper Extremity Assessment: Generalized weakness   Lower Extremity Assessment Lower Extremity Assessment: Defer to PT evaluation   Cervical / Trunk Assessment Cervical / Trunk Assessment: Normal   Communication Communication Communication: No difficulties   Cognition Arousal/Alertness: Awake/alert Behavior During Therapy: Flat affect Overall Cognitive Status: No family/caregiver present to determine baseline cognitive functioning Area of Impairment: Orientation;Attention;Memory;Following commands;Safety/judgement;Awareness;Problem solving                 Orientation Level: Place;Time;Situation Current Attention Level: Sustained Memory: Decreased short-term memory;Decreased recall of precautions Following Commands: Follows one step commands inconsistently;Follows one step commands with increased time Safety/Judgement: Decreased awareness of safety;Decreased awareness of deficits Awareness: Intellectual Problem Solving: Slow processing;Decreased initiation;Difficulty sequencing;Requires verbal cues;Requires tactile cues General Comments: Pt A&Ox1 today, delayed responses to orientation questions but very conversive about his time in [employer].    General Comments  Pt received on 3 L O2, stats at 89% at rest. Difficulty obtaining reading during tasks with finger probe but pt endorsed fatigue and SOB after standing 2x for peri care. O2 at 83% at lowest after activity, increased to 88-89% within 3 min    Exercises     Shoulder Instructions      Home Living Family/patient expects to be discharged to:: Private residence Living Arrangements: Other relatives (brother) Available Help at Discharge:  Family;Available PRN/intermittently (brother works) Type of Home: House Home Access: Stairs to enter Technical  of Steps: 6 Entrance Stairs-Rails: Right;Left Home Layout: One level     Bathroom Shower/Tub: Chief Strategy Officer: Kasandra Knudsen - single point          Prior Functioning/Environment Level of Independence: Independent        Comments: Pt reports independence with ADLs and no use of AD for mobility. Reports someone "picks up a sandwich for me" when asked about meal prep at home. Unsure of accuracy due to pt confusion        OT Problem List: Decreased strength;Decreased activity tolerance;Impaired balance (sitting and/or standing);Decreased cognition;Decreased safety awareness;Decreased knowledge of use of DME or AE;Cardiopulmonary status limiting activity      OT Treatment/Interventions: Self-care/ADL training;Therapeutic exercise;Energy conservation;DME and/or AE instruction;Therapeutic activities;Patient/family education    OT Goals(Current goals can be found in the care plan section) Acute Rehab OT Goals Patient Stated Goal: get appetite back OT Goal Formulation: With patient Time For Goal Achievement: 12/07/19 Potential to Achieve Goals: Good ADL Goals Pt Will Perform Grooming: with supervision;standing Pt Will Perform Lower Body Bathing: with supervision;sit to/from stand Pt Will Perform Lower Body Dressing: with supervision;sit to/from stand Pt Will Transfer to Toilet: with min guard assist;stand pivot transfer;bedside commode Pt Will Perform Toileting - Clothing Manipulation and hygiene: with min guard assist;sit to/from stand Additional ADL Goal #1: Pt to demonstrate pursed lip breathing during/after activities with minimal cues in order to maximize safety and improve O2 stats during ADLs  OT Frequency: Min 2X/week   Barriers to D/C:  Co-evaluation              AM-PAC OT "6 Clicks" Daily Activity     Outcome  Measure Help from another person eating meals?: A Little Help from another person taking care of personal grooming?: A Little Help from another person toileting, which includes using toliet, bedpan, or urinal?: A Lot Help from another person bathing (including washing, rinsing, drying)?: A Lot Help from another person to put on and taking off regular upper body clothing?: A Little Help from another person to put on and taking off regular lower body clothing?: A Lot 6 Click Score: 15   End of Session Equipment Utilized During Treatment: Gait belt;Oxygen Nurse Communication: Mobility status;Other (comment) (O2 reading, condom cath malfunction)  Activity Tolerance: Patient tolerated treatment well Patient left: in chair;with call bell/phone within reach;with chair alarm set  OT Visit Diagnosis: Unsteadiness on feet (R26.81);Other abnormalities of gait and mobility (R26.89);Muscle weakness (generalized) (M62.81);Other symptoms and signs involving cognitive function                Time: 1243-1317 OT Time Calculation (min): 34 min Charges:  OT General Charges $OT Visit: 1 Visit OT Evaluation $OT Eval Moderate Complexity: 1 Mod OT Treatments $Self Care/Home Management : 8-22 mins  Layla Maw, OTR/L  Layla Maw 11/23/2019, 1:53 PM

## 2019-11-23 NOTE — Progress Notes (Signed)
Patient currently sitting in chair since this morning in no acute distress.  Patient encouraged to use the incentive spirometer and flutter valve per orders.  Patient educated on both items and is able to use them.  Patient requires reminders to use these two items.  Patient is slow to respond with answers but is fully aware of where he is and why he is here.  Patient does not have much of an appetite but does state that it's too much food and he doesn't normally eat that much. Patient is on the phone talking to his brother at the time I was in room.  Patient had no difficulty speaking and could speak in full sentences.  Patient SpO2 is on 2 LPM and is tolerating well.   MD made aware of status.

## 2019-11-23 NOTE — Progress Notes (Signed)
PROGRESS NOTE                                                                                                                                                                                                             Patient Demographics:    Marc Leichter, is a 74 y.o. male, DOB - 1946-04-14, GYJ:[id number]  Outpatient Primary MD for the patient is Biagio Borg, MD   Admit date - 11/21/2019   LOS - 2  Chief Complaint  Patient presents with  . Covid Exposure  . Weakness       Brief Narrative: Patient is a 74 y.o. male with PMHx of HTN, depression, chronic thrombocytopenia, CKD stage IIIa-tested positive for COVID-19 on 8/3-presented to the ED on 8/6 with generalized weakness, cough, confusion-upon further evaluation-he was found to have acute hypoxic respiratory failure secondary to COVID-19 pneumonia.  See below for further details.  Significant Events: 8/6>> Admit to [facility] for metabolic encephalopathy, acute hypoxic respiratory failure secondary to COVID-19 pneumonia  Significant studies: 8/3>>Chest x-ray: No focal airspace disease 8/3>> CTA chest: No PE/dissection-patchy peripheral groundglass opacities 8/3>> CT head: No acute intracranial findings 8/3>> MRI brain: No evidence of recent infarction, hemorrhage or mass 8/6>> chest x-ray: Bilateral pulmonary infiltrates  COVID-19 medications: Steroids: 8/6>> Remdesivir: 8/6>>  Antibiotics: None  Microbiology data: 8/6 >>blood culture: No growth  Procedures: None  Consults: None  DVT prophylaxis: enoxaparin (LOVENOX) injection 40 mg Start: 11/22/19 1800 SCDs Start: 11/21/19 1351 Prophylactic Lovenox-watch platelets.    Subjective:    [name] today feels much better-he seems to be much more awake and alert compared to yesterday his responses are much more fluent/faster compared to yesterday.   Assessment  & Plan :   Acute Hypoxic Resp  Failure due to Covid 19 Viral pneumonia: Slowly improving-inflammatory markers are downtrending-on 1-2 L of oxygen this morning.  Continue steroids/remdesivir.  Given clinical improvement-doubt he requires Actemra at this point.    Fever: afebrile O2 requirements:  SpO2: 90 % O2 Flow Rate (L/min): 2 L/min FiO2 (%): (!) 3 %   COVID-19 Labs: Recent Labs    11/21/19 1051 11/21/19 1051 11/21/19 1516 11/22/19 0438 11/23/19 0412  DDIMER 1.25*  --   --  1.41* 2.21*  FERRITIN 838*   < > 930* 883* 990*  LDH  --   --  388*  --   --   CRP  --   --  9.5* 10.9* 7.3*   < > = values in this interval not displayed.       Component Value Date/Time   BNP 73.2 11/21/2019 1548    Recent Labs  Lab 11/21/19 1516  PROCALCITON <0.10    Lab Results  Component Value Date   SARSCOV2NAA POSITIVE (A) 11/18/2019   SARSCOV2NAA NOT DETECTED 12/21/2018     Prone/Incentive Spirometry: encouraged  incentive spirometry use 3-4/hour.  Acute metabolic encephalopathy: Suspect secondary to hypoxemia and AKI-has significantly improved with supportive care-doubt further work-up is required.    AKI on CKD stage IIIa: Likely hemodynamically mediated-improving with supportive care-creatinine not very far from usual baseline.  Continue to follow electrolytes periodically.  Transaminitis: Mild-likely related to COVID-19-follow.  Thrombocytopenia: Appears to be a chronic issue-watch closely  HTN: BP stable-continue to hold antihypertensives.  Depression: Continue Celexa  Deconditioning/debility: Secondary to acute illness-PT eval appreciated-recommendations of SNF.  Obesity: Estimated body mass index is 31.32 kg/m as calculated from the following:   Height as of this encounter: 5\' 7"  (1.702 m).   Weight as of this encounter: 90.7 kg.    ABG:    Component Value Date/Time   TCO2 28 01/18/2010 1531    Vent Settings: N/A FiO2 (%):  [3 %] 3 %  Condition - Stable  Family Communication  : Left  voicemail for brother Dominica on 8/8  Code Status :  Full Code  Diet :  Diet Order            Diet heart healthy/carb modified Room service appropriate? Yes; Fluid consistency: Thin  Diet effective now                  Disposition Plan  :   Status is: Inpatient  Remains inpatient appropriate because:Inpatient level of care appropriate due to severity of illness   Dispo: The patient is from: Home              Anticipated d/c is to: SNF              Anticipated d/c date is: 3 days              Patient currently is not medically stable to d/c.   Barriers to discharge: Hypoxia requiring O2 supplementation/complete 5 days of IV Remdesivir  Antimicorbials  :    Anti-infectives (From admission, onward)   Start     Dose/Rate Route Frequency Ordered Stop   11/22/19 1000  remdesivir 100 mg in sodium chloride 0.9 % 100 mL IVPB     Discontinue    "Followed by" Linked Group Details   100 mg 200 mL/hr over 30 Minutes Intravenous Daily 11/21/19 1027 11/26/19 0959   11/22/19 1000  remdesivir 100 mg in sodium chloride 0.9 % 100 mL IVPB  Status:  Discontinued       "Followed by" Linked Group Details   100 mg 200 mL/hr over 30 Minutes Intravenous Daily 11/21/19 1353 11/21/19 1354   11/21/19 1400  remdesivir 200 mg in sodium chloride 0.9% 250 mL IVPB  Status:  Discontinued       "Followed by" Linked Group Details   200 mg 580 mL/hr over 30 Minutes Intravenous Once 11/21/19 1353 11/21/19 1354   11/21/19 1130  remdesivir 200 mg in sodium chloride 0.9% 250 mL IVPB       "Followed by" Linked Group Details   200 mg 580 mL/hr over  30 Minutes Intravenous Once 11/21/19 1027 11/21/19 1338      Inpatient Medications  Scheduled Meds: . albuterol  2 puff Inhalation Q6H  . vitamin C  500 mg Oral Daily  . citalopram  10 mg Oral Daily  . dexamethasone (DECADRON) injection  6 mg Intravenous Daily  . enoxaparin (LOVENOX) injection  40 mg Subcutaneous Q24H  . zinc sulfate  220 mg Oral Daily    Continuous Infusions: . sodium chloride    . remdesivir 100 mg in NS 100 mL 100 mg (11/23/19 0915)   PRN Meds:.sodium chloride, acetaminophen, chlorpheniramine-HYDROcodone, guaiFENesin-dextromethorphan, ondansetron **OR** ondansetron (ZOFRAN) IV, polyethylene glycol   Time Spent in minutes  25  See all Orders from today for further details   Oren Binet M.D on 11/23/2019 at 10:35 AM  To page go to [email or web address] - use universal password  [facility] -  Office  [phone]    Objective:   Vitals:   11/22/19 2203 11/22/19 2300 11/23/19 0419 11/23/19 0726  BP:  (!) 157/126 122/82 126/86  Pulse: 78 78 62 64  Resp: 17 15 17 20   Temp:  98.7 F (37.1 C) 98.3 F (36.8 C) 98.2 F (36.8 C)  TempSrc:  Oral Oral Oral  SpO2: 95% 96% 91% 90%  Weight:      Height:        Wt Readings from Last 3 Encounters:  11/21/19 90.7 kg  11/18/19 86.2 kg  10/10/19 90.7 kg     Intake/Output Summary (Last 24 hours) at 11/23/2019 1035 Last data filed at 11/23/2019 1018 Gross per 24 hour  Intake 340 ml  Output 900 ml  Net -560 ml     Physical Exam Gen Exam:Alert awake-not in any distress HEENT:atraumatic, normocephalic Chest: B/L clear to auscultation anteriorly CVS:S1S2 regular Abdomen:soft non tender, non distended Extremities:no edema Neurology: Non focal Skin: no rash   Data Review:    CBC Recent Labs  Lab 11/18/19 1141 11/18/19 1310 11/21/19 1051 11/22/19 0438 11/23/19 0412  WBC 4.0 3.7* 4.8 4.4 5.8  HGB 13.2 13.3 13.4 13.3 13.6  HCT 40.7 41.6 41.5 41.7 41.6  PLT 92* 86* PLATELET CLUMPS NOTED ON SMEAR, UNABLE TO ESTIMATE 114* 144*  MCV 88.9 90.8 89.4 89.9 89.1  MCH 28.8 29.0 28.9 28.7 29.1  MCHC 32.4 32.0 32.3 31.9 32.7  RDW 13.1 13.2 13.2 13.2 13.2  LYMPHSABS 1,012 0.9 0.5* 0.4* 0.4*  MONOABS  --  0.4 0.4 0.3 0.5  EOSABS 0* 0.0 0.0 0.0 0.0  BASOSABS 12 0.0 0.0 0.0 0.0    Chemistries  Recent Labs  Lab 11/18/19 1310 11/21/19 1051 11/21/19 1516  11/22/19 0438 11/23/19 0412  NA 133* 138 139 137 140  K 4.2 4.1 4.4 4.6 4.5  CL 97* 100 101 100 102  CO2 28 28 25 27 27   GLUCOSE 103* 120* 122* 138* 151*  BUN 19 16 14 17  31*  CREATININE 1.62* 1.55* 1.28* 1.24 1.36*  CALCIUM 8.8* 8.6* 8.7* 8.4* 8.5*  MG  --   --   --  2.4  --   AST 61* 101* 101* 84* 60*  ALT 31 48* 53* 49* 44  ALKPHOS 55 50 53 51 49  BILITOT 0.6 0.9 0.8 0.7 0.7   ------------------------------------------------------------------------------------------------------------------ No results for input(s): CHOL, HDL, LDLCALC, TRIG, CHOLHDL, LDLDIRECT in the last 72 hours.  No results found for: HGBA1C ------------------------------------------------------------------------------------------------------------------ Recent Labs    11/21/19 1516  TSH 0.697   ------------------------------------------------------------------------------------------------------------------ Recent Labs    11/21/19 1516 11/21/19 1516  11/22/19 0438 11/23/19 0412  VITAMINB12 731  --   --   --   FOLATE 26.9  --   --   --   FERRITIN 930*   < > 883* 990*   < > = values in this interval not displayed.    Coagulation profile No results for input(s): INR, PROTIME in the last 168 hours.  Recent Labs    11/22/19 0438 11/23/19 0412  DDIMER 1.41* 2.21*    Cardiac Enzymes No results for input(s): CKMB, TROPONINI, MYOGLOBIN in the last 168 hours.  Invalid input(s): CK ------------------------------------------------------------------------------------------------------------------    Component Value Date/Time   BNP 73.2 11/21/2019 1548    Micro Results Recent Results (from the past 240 hour(s))  Urine Culture     Status: None   Collection Time: 11/18/19 11:41 AM   Specimen: Urine  Result Value Ref Range Status   Source: URINE  Final   Status: FINAL  Final   Result: No Growth  Final  SARS Coronavirus 2 by RT PCR (hospital order, performed in [place] hospital lab)  Nasopharyngeal Nasopharyngeal Swab     Status: Abnormal   Collection Time: 11/18/19  9:09 PM   Specimen: Nasopharyngeal Swab  Result Value Ref Range Status   SARS Coronavirus 2 POSITIVE (A) NEGATIVE Final    Comment: RESULT CALLED TO, READ BACK BY AND VERIFIED WITH: EMAIL SENT TO L,BERDIK @2236  11/18/19 EB Performed at [facility], [place] [address], [place], [place] [address]   Culture, blood (Routine X 2) w Reflex to ID Panel     Status: None (Preliminary result)   Collection Time: 11/21/19  4:43 PM   Specimen: BLOOD  Result Value Ref Range Status   Specimen Description BLOOD SITE NOT SPECIFIED  Final   Special Requests   Final    BOTTLES DRAWN AEROBIC AND ANAEROBIC Blood Culture adequate volume   Culture   Final    NO GROWTH 2 DAYS Performed at [facility] [address], [place], [place] [address]    Report Status PENDING  Incomplete  Culture, blood (Routine X 2) w Reflex to ID Panel     Status: None (Preliminary result)   Collection Time: 11/21/19  7:30 PM   Specimen: BLOOD  Result Value Ref Range Status   Specimen Description BLOOD BLOOD LEFT FOREARM  Final   Special Requests   Final    BOTTLES DRAWN AEROBIC ONLY Blood Culture results may not be optimal due to an inadequate volume of blood received in culture bottles   Culture   Final    NO GROWTH 2 DAYS Performed at [facility], [place] [address] [place],  [address]    Report Status PENDING  Incomplete    Radiology Reports CT Head Wo Contrast  Result Date: 11/18/2019 CLINICAL DATA:  Neurologic deficit. EXAM: CT HEAD WITHOUT CONTRAST TECHNIQUE: Contiguous axial images were obtained from the base of the skull through the vertex without intravenous contrast. COMPARISON:  None. FINDINGS: Brain: Age related cerebral atrophy, ventriculomegaly and advanced periventricular white matter disease. The ventricles are in the midline without mass effect or shift. No extra-axial fluid collections are identified. No  CT findings for acute hemispheric infarction or intracranial hemorrhage. No mass lesions are identified. The brainstem and cerebellum are grossly normal. Vascular: Scattered vascular calcifications. No aneurysm or hyperdense vessels. Skull: No skull fracture or bone lesions. Sinuses/Orbits: The paranasal sinuses and mastoid air cells are clear. The globes are intact. Other: Mildly prominent left  temporalis muscle. No scalp hematoma or obvious laceration. IMPRESSION: 1. Age related cerebral atrophy, ventriculomegaly and advanced periventricular white matter disease. 2. No acute intracranial findings or mass lesions. Electronically Signed   By: Marijo Sanes M.D.   On: 11/18/2019 18:16   MR BRAIN WO CONTRAST  Result Date: 11/18/2019 CLINICAL DATA:  Off balance EXAM: MRI HEAD WITHOUT CONTRAST TECHNIQUE: Multiplanar, multiecho pulse sequences of the brain and surrounding structures were obtained without intravenous contrast. COMPARISON:  None. FINDINGS: Brain: There is no acute infarction or intracranial hemorrhage. There is no intracranial mass, mass effect, or edema. There is no hydrocephalus or extra-axial fluid collection. Prominence of the ventricles and sulci reflects generalized parenchymal volume loss. Patchy and confluent areas of T2 hyperintensity in the supratentorial white matter are nonspecific but may reflect moderate chronic microvascular ischemic changes. Scattered foci of susceptibility hypointensity in the peripheral cerebral hemispheres most compatible with chronic microhemorrhages. Vascular: Major vessel flow voids at the skull base are preserved. Skull and upper cervical spine: Normal marrow signal is preserved. Sinuses/Orbits: Mild mucosal thickening.  Orbits are unremarkable. Other: Sella is unremarkable.  Mastoid air cells are clear. IMPRESSION: No evidence of recent infarction, hemorrhage, or mass. Moderate chronic microvascular ischemic changes. Mild to moderate burden of chronic  microhemorrhages, which may reflect chronic hypertension or amyloid angiopathy. Electronically Signed   By: Macy Mis M.D.   On: 11/18/2019 19:41   Portable chest 1 View  Result Date: 11/21/2019 CLINICAL DATA:  COVID positive.  Weakness and shortness of breath. EXAM: PORTABLE CHEST 1 VIEW COMPARISON:  CT 11/18/2019.  Chest x-ray 11/18/2019. FINDINGS: Cardiomegaly. Tortuous thoracic aorta again noted. Prominent bilateral pulmonary infiltrates/edema noted on today's exam. No pleural effusion or pneumothorax. Degenerative change thoracic spine. IMPRESSION: 1.  Cardiomegaly.  No pulmonary venous congestion. 2. Prominent bilateral pulmonary infiltrates/edema noted on today's exam in this known COVID positive patient. Electronically Signed   By: Marcello Moores  Register   On: 11/21/2019 10:51   DG Chest Port 1 View  Result Date: 11/18/2019 CLINICAL DATA:  Hypotension, cough. EXAM: PORTABLE CHEST 1 VIEW COMPARISON:  11/26/2018 chest radiograph. FINDINGS: Hypoinflated lungs. No pneumothorax or pleural effusion. No focal airspace opacity. Increased conspicuity of the cardiomediastinal silhouette. Tortuous thoracic aorta. Multilevel spondylosis. No acute osseous abnormality. IMPRESSION: No focal airspace disease. Increased conspicuity of the cardiomediastinal silhouette compared to prior exam. While this may be artifactual accentuation secondary to hypoinflation and AP technique, consider further evaluation with CTA chest given the presence of hypotension. These results were called by telephone at the time of interpretation on 11/18/2019 at 3:54 pm to provider [facility] , who verbally acknowledged these results. Electronically Signed   By: Primitivo Gauze M.D.   On: 11/18/2019 15:57   CT ANGIO CHEST AORTA W/CM & OR WO/CM  Result Date: 11/18/2019 CLINICAL DATA:  Hypertension.  Chest pain. EXAM: CT ANGIOGRAPHY CHEST WITH CONTRAST TECHNIQUE: Multidetector CT imaging of the chest was performed using the standard  protocol during bolus administration of intravenous contrast. Multiplanar CT image reconstructions and MIPs were obtained to evaluate the vascular anatomy. CONTRAST:  128mL OMNIPAQUE IOHEXOL 350 MG/ML SOLN COMPARISON:  Chest x-ray 06/2019 FINDINGS: Cardiovascular: The heart is normal in size. No pericardial effusion. Mild tortuosity of the thoracic aorta but no aneurysm or dissection. Scattered atherosclerotic calcifications. The branch vessels are patent. Minimal scattered coronary artery calcifications. The pulmonary arterial tree is fairly well opacified. No filling defects to suggest pulmonary embolism. Mediastinum/Nodes: No mediastinal or hilar mass or lymphadenopathy. The esophagus is  grossly normal. Lungs/Pleura: Underlying mild emphysematous changes with some areas of pulmonary scarring. Patchy peripheral ground-glass infiltrates in both lungs most consistent with atypical/viral pneumonia such as COVID pneumonia. Recommend correlation with COVID 19 status. No worrisome pulmonary lesions or pleural effusions. No pulmonary edema. Moderate eventration of the left hemidiaphragm noted with some overlying vascular crowding and atelectasis. Upper Abdomen: No significant upper abdominal findings. Moderate motion artifact. Musculoskeletal: No chest wall mass, supraclavicular or axillary adenopathy. The thyroid gland is unremarkable. The bony thorax is intact. Review of the MIP images confirms the above findings. IMPRESSION: 1. No CT findings for pulmonary embolism. 2. Mild tortuosity of the thoracic aorta but no aneurysm or dissection. 3. Patchy peripheral ground-glass infiltrates in both lungs most consistent with atypical/viral pneumonia such as COVID pneumonia. Recommend correlation with COVID 19 status. 4. No mediastinal or hilar mass or adenopathy. 5. Emphysema and aortic atherosclerosis. Aortic Atherosclerosis (ICD10-I70.0) and Emphysema (ICD10-J43.9). Electronically Signed   By: Marijo Sanes M.D.   On:  11/18/2019 18:22

## 2019-11-23 NOTE — Plan of Care (Signed)
  Problem: Respiratory: Goal: Complications related to the disease process, condition or treatment will be avoided or minimized Outcome: Not Progressing   Problem: Respiratory: Goal: Will maintain a patent airway Outcome: Progressing Goal: Complications related to the disease process, condition or treatment will be avoided or minimized Outcome: Not Progressing   Problem: Coping: Goal: Psychosocial and spiritual needs will be supported Outcome: Progressing   Problem: Education: Goal: Knowledge of risk factors and measures for prevention of condition will improve Outcome: Progressing   Problem: Skin Integrity: Goal: Risk for impaired skin integrity will decrease Outcome: Not Progressing

## 2019-11-24 LAB — COMPREHENSIVE METABOLIC PANEL
ALT: 58 U/L — ABNORMAL HIGH (ref 0–44)
AST: 67 U/L — ABNORMAL HIGH (ref 15–41)
Albumin: 2.7 g/dL — ABNORMAL LOW (ref 3.5–5.0)
Alkaline Phosphatase: 50 U/L (ref 38–126)
Anion gap: 8 (ref 5–15)
BUN: 26 mg/dL — ABNORMAL HIGH (ref 8–23)
CO2: 27 mmol/L (ref 22–32)
Calcium: 8.3 mg/dL — ABNORMAL LOW (ref 8.9–10.3)
Chloride: 103 mmol/L (ref 98–111)
Creatinine, Ser: 1.11 mg/dL (ref 0.61–1.24)
GFR calc Af Amer: >60 mL/min (ref >60)
GFR calc non Af Amer: >60 mL/min (ref >60)
Glucose, Bld: 137 mg/dL — ABNORMAL HIGH (ref 70–99)
Potassium: 4.1 mmol/L (ref 3.5–5.1)
Sodium: 138 mmol/L (ref 135–145)
Total Bilirubin: 0.7 mg/dL (ref 0.3–1.2)
Total Protein: 5.9 g/dL — ABNORMAL LOW (ref 6.5–8.1)

## 2019-11-24 LAB — CBC WITH DIFFERENTIAL/PLATELET
Abs Immature Granulocytes: 0.08 10*3/uL — ABNORMAL HIGH (ref 0.00–0.07)
Basophils Absolute: 0 10*3/uL (ref 0.0–0.1)
Basophils Relative: 0 %
Eosinophils Absolute: 0 10*3/uL (ref 0.0–0.5)
Eosinophils Relative: 0 %
HCT: 38.1 % — ABNORMAL LOW (ref 39.0–52.0)
Hemoglobin: 12.2 g/dL — ABNORMAL LOW (ref 13.0–17.0)
Immature Granulocytes: 1 %
Lymphocytes Relative: 6 %
Lymphs Abs: 0.5 10*3/uL — ABNORMAL LOW (ref 0.7–4.0)
MCH: 28.2 pg (ref 26.0–34.0)
MCHC: 32 g/dL (ref 30.0–36.0)
MCV: 88 fL (ref 80.0–100.0)
Monocytes Absolute: 0.6 10*3/uL (ref 0.1–1.0)
Monocytes Relative: 7 %
Neutro Abs: 7.4 10*3/uL (ref 1.7–7.7)
Neutrophils Relative %: 86 %
Platelets: 149 10*3/uL — ABNORMAL LOW (ref 150–400)
RBC: 4.33 MIL/uL (ref 4.22–5.81)
RDW: 13 % (ref 11.5–15.5)
WBC: 8.5 10*3/uL (ref 4.0–10.5)
nRBC: 0 % (ref 0.0–0.2)

## 2019-11-24 LAB — C-REACTIVE PROTEIN: CRP: 3.5 mg/dL — ABNORMAL HIGH (ref <1.0)

## 2019-11-24 LAB — T.PALLIDUM AB, TOTAL: T Pallidum Abs: NONREACTIVE

## 2019-11-24 LAB — D-DIMER, QUANTITATIVE: D-Dimer, Quant: 1.56 ug/mL-FEU — ABNORMAL HIGH (ref 0.00–0.50)

## 2019-11-24 LAB — FERRITIN: Ferritin: 926 ng/mL — ABNORMAL HIGH (ref 24–336)

## 2019-11-24 MED ORDER — ALBUTEROL SULFATE HFA 108 (90 BASE) MCG/ACT IN AERS
2.0000 | INHALATION_SPRAY | Freq: Two times a day (BID) | RESPIRATORY_TRACT | Status: DC
  Administered 2019-11-24 – 2019-11-26 (×5): 2 via RESPIRATORY_TRACT
  Filled 2019-11-24: qty 6.7

## 2019-11-24 MED ORDER — SENNA 8.6 MG PO TABS
2.0000 | ORAL_TABLET | Freq: Every day | ORAL | Status: DC
  Administered 2019-11-24 – 2019-12-03 (×8): 17.2 mg via ORAL
  Filled 2019-11-24 (×10): qty 2

## 2019-11-24 MED ORDER — BISACODYL 10 MG RE SUPP: 10.0000 mg | Freq: Every day | RECTAL | Status: DC | PRN

## 2019-11-24 MED ORDER — POLYETHYLENE GLYCOL 3350 17 G PO PACK
17.0000 g | PACK | Freq: Every day | ORAL | Status: DC
  Administered 2019-11-24 – 2019-12-09 (×16): 17 g via ORAL
  Filled 2019-11-24 (×16): qty 1

## 2019-11-24 NOTE — Social Work (Signed)
RE: [name] Date of Birth: January 15, 1946   To Whom It May Concern:  Please be advised that the above-named patient will require a short-term nursing home stay - anticipated 30 days or less for rehabilitation and strengthening.  The plan is for return home.

## 2019-11-24 NOTE — NC FL2 (Addendum)
[place] LEVEL OF CARE SCREENING TOOL     IDENTIFICATION  Patient Name: [name] Birthdate: 03/11/1946 Sex: male Admission Date (Current Location): 11/21/2019  [facility] and [place] Number:  Herbalist and Address:  [facility] [place], [place] [address] [place], [place] [address]      Provider Number: [id number]  Attending Physician Name and Address:  Jonetta Osgood, MD  Relative Name and Phone Number:  Owynn Mosqueda    Current Level of Care: Hospital Recommended Level of Care: [place] Prior Approval Number:    Date Approved/Denied:   PASRR Number: [id number]  Discharge Plan: SNF    Current Diagnoses: Patient Active Problem List   Diagnosis Date Noted  . CKD (chronic kidney disease), stage III 11/21/2019  . Acute metabolic encephalopathy 96/22/2979  . Acute hypoxemic respiratory failure due to COVID-19 ([place]) 11/18/2019  . Hypotension 11/18/2019  . Leukocytosis 10/10/2019  . Thrombocytopenia ([place]) 10/10/2019  . Vitamin D deficiency 08/07/2019  . Preop examination 11/22/2018  . Chronic constipation 09/16/2018  . Erectile dysfunction 07/11/2017  . Psychosis ([place]) 09/01/2015  . Non-compliant behavior 09/15/2014  . History of colonic polyps 12/30/2013  . Preventative health care 12/30/2013  . Depression 12/30/2013  . PTSD (post-traumatic stress disorder) 02/03/2013  . Essential hypertension 02/17/2010    Orientation RESPIRATION BLADDER Height & Weight     Self, Time, Situation, Place  O2 (Nasal Cannula) Incontinent Weight: 200 lb (90.7 kg) Height:  5\' 7"  (170.2 cm)  BEHAVIORAL SYMPTOMS/MOOD NEUROLOGICAL BOWEL NUTRITION STATUS      Continent Diet (See discharge summary)  AMBULATORY STATUS COMMUNICATION OF NEEDS Skin   Extensive Assist Verbally Normal                       Personal Care Assistance Level of Assistance  Bathing, Feeding, Dressing Bathing Assistance: Limited  assistance Feeding assistance: Limited assistance Dressing Assistance: Limited assistance     Functional Limitations Info  Hearing, Speech, Sight Sight Info: Adequate Hearing Info: Adequate Speech Info: Adequate    SPECIAL CARE FACTORS FREQUENCY  PT (By licensed PT), OT (By licensed OT)     PT Frequency: 5x a week OT Frequency: 5x a week            Contractures Contractures Info: Not present    Additional Factors Info  Allergies, Code Status, Isolation Precautions Code Status Info: Full Allergies Info: Lisinopril      Isolations Precautions Info: Covid+     Current Medications (11/24/2019):  This is the current hospital active medication list Current Facility-Administered Medications  Medication Dose Route Frequency Provider Last Rate Last Admin  . 0.9 %  sodium chloride infusion   Intravenous PRN Daleen Bo, MD      . acetaminophen (TYLENOL) tablet 650 mg  650 mg Oral Q6H PRN Pahwani, Rinka R, MD      . albuterol (VENTOLIN HFA) 108 (90 Base) MCG/ACT inhaler 2 puff  2 puff Inhalation BID Ghimire, Henreitta Leber, MD      . ascorbic acid (VITAMIN C) tablet 500 mg  500 mg Oral Daily Pahwani, Rinka R, MD   500 mg at 11/23/19 0917  . chlorpheniramine-HYDROcodone (TUSSIONEX) 10-8 MG/5ML suspension 5 mL  5 mL Oral Q12H PRN Pahwani, Rinka R, MD      . citalopram (CELEXA) tablet 10 mg  10 mg Oral Daily Pahwani, Rinka R, MD   10 mg at 11/23/19 0916  . dexamethasone (DECADRON) injection 6  mg  6 mg Intravenous Daily Pahwani, Rinka R, MD   6 mg at 11/24/19 0934  . enoxaparin (LOVENOX) injection 40 mg  40 mg Subcutaneous Q24H Jonetta Osgood, MD   40 mg at 11/23/19 1714  . guaiFENesin-dextromethorphan (ROBITUSSIN DM) 100-10 MG/5ML syrup 10 mL  10 mL Oral Q4H PRN Pahwani, Rinka R, MD      . ondansetron (ZOFRAN) tablet 4 mg  4 mg Oral Q6H PRN Pahwani, Rinka R, MD       Or  . ondansetron (ZOFRAN) injection 4 mg  4 mg Intravenous Q6H PRN Pahwani, Rinka R, MD      . polyethylene glycol  (MIRALAX / GLYCOLAX) packet 17 g  17 g Oral BID PRN Pahwani, Rinka R, MD      . remdesivir 100 mg in sodium chloride 0.9 % 100 mL IVPB  100 mg Intravenous Daily Daleen Bo, MD 200 mL/hr at 11/24/19 0942 100 mg at 11/24/19 0942  . zinc sulfate capsule 220 mg  220 mg Oral Daily Pahwani, Rinka R, MD   220 mg at 11/23/19 5038     Discharge Medications: Please see discharge summary for a list of discharge medications.  Relevant Imaging Results:  Relevant Lab Results:   Additional Information SSN [id number]  Arvella Merles, [place]

## 2019-11-24 NOTE — TOC Initial Note (Addendum)
Transition of Care [facility]) - Initial/Assessment Note    Patient Details  Name: [name] MRN: [id number] Date of Birth: Mar 17, 1946  Transition of Care [facility]) CM/SW Contact:    Jacquelynn Cree Phone Number: 11/24/2019, 11:09 AM  Clinical Narrative:                 CSW spoke with patient regarding PT recommendation for SNF placement at discharge. Patient expressed understanding and is in agreement, providing permission to be faxed out. CSW explained insurance authorization process and the limited SNF choice. Patient stated he would update his brother [name]. CSW reached out to [facility] to review patient's SNF referral.    Expected Discharge Plan: [place] Barriers to Discharge: Continued Medical Work up, Ship broker   Patient Goals and CMS Choice   CMS Medicare.gov Compare Post Acute Care list provided to:: Patient Choice offered to / list presented to : Patient  Expected Discharge Plan and Services Expected Discharge Plan: [place] arrangements for the past 2 months: Single Family Home                                      Prior Living Arrangements/Services Living arrangements for the past 2 months: Single Family Home Lives with:: Siblings Patient language and need for interpreter reviewed:: Yes Do you feel safe going back to the place where you live?: Yes      Need for Family Participation in Patient Care: No (Comment) Care giver support system in place?: Yes (comment)   Criminal Activity/Legal Involvement Pertinent to Current Situation/Hospitalization: No - Comment as needed  Activities of Daily Living      Permission Sought/Granted Permission sought to share information with : Facility Sport and exercise psychologist, Family Supports Permission granted to share information with : Yes, Verbal Permission Granted     Permission granted to share info w AGENCY: SNFs        Emotional Assessment    Attitude/Demeanor/Rapport: Unable to Assess Affect (typically observed): Unable to Assess Orientation: : Oriented to Self, Oriented to  Time, Oriented to Situation, Oriented to Place Alcohol / Substance Use: Not Applicable Psych Involvement: No (comment)  Admission diagnosis:  Shortness of breath [R06.02] Confusion [R41.0] Hypoxia [R09.02] Acute hypoxemic respiratory failure due to COVID-19 ([place]) [U07.1, J96.01] COVID-19 virus infection [U07.1] Patient Active Problem List   Diagnosis Date Noted  . CKD (chronic kidney disease), stage III 11/21/2019  . Acute metabolic encephalopathy 23/30/0762  . Acute hypoxemic respiratory failure due to COVID-19 ([place]) 11/18/2019  . Hypotension 11/18/2019  . Leukocytosis 10/10/2019  . Thrombocytopenia ([place]) 10/10/2019  . Vitamin D deficiency 08/07/2019  . Preop examination 11/22/2018  . Chronic constipation 09/16/2018  . Erectile dysfunction 07/11/2017  . Psychosis ([place]) 09/01/2015  . Non-compliant behavior 09/15/2014  . History of colonic polyps 12/30/2013  . Preventative health care 12/30/2013  . Depression 12/30/2013  . PTSD (post-traumatic stress disorder) 02/03/2013  . Essential hypertension 02/17/2010   PCP:  Biagio Borg, MD Pharmacy:   [facility], [place] [address] Phone: [phone] Fax: [phone], [place] [address] Phone: [phone] Fax: [phone]  [place], [place] [address]  [place] [address] Phone: [phone] Fax: [phone]     Social Determinants of Health (SDOH) Interventions    Readmission Risk Interventions No flowsheet data found.

## 2019-11-24 NOTE — Progress Notes (Addendum)
PROGRESS NOTE                                                                                                                                                                                                             Patient Demographics:    [name], is a 74 y.o. male, DOB - 1946/02/18, VWU:[id number]  Outpatient Primary MD for the patient is Biagio Borg, MD   Admit date - 11/21/2019   LOS - 3  Chief Complaint  Patient presents with   Covid Exposure   Weakness       Brief Narrative: Patient is a 74 y.o. male with PMHx of HTN, depression, chronic thrombocytopenia, CKD stage IIIa-tested positive for COVID-19 on 8/3 (unvaccinated)-presented to the ED on 8/6 with generalized weakness, cough, confusion-upon further evaluation-he was found to have acute hypoxic respiratory failure secondary to COVID-19 pneumonia.  See below for further details.  Significant Events: 8/6>> Admit to [facility] for metabolic encephalopathy, acute hypoxic respiratory failure secondary to COVID-19 pneumonia  Significant studies: 8/3>>Chest x-ray: No focal airspace disease 8/3>> CTA chest: No PE/dissection-patchy peripheral groundglass opacities 8/3>> CT head: No acute intracranial findings 8/3>> MRI brain: No evidence of recent infarction, hemorrhage or mass 8/6>> chest x-ray: Bilateral pulmonary infiltrates  COVID-19 medications: Steroids: 8/6>> Remdesivir: 8/6>>  Antibiotics: None  Microbiology data: 8/6 >>blood culture: No growth  Procedures: None  Consults: None  DVT prophylaxis: enoxaparin (LOVENOX) injection 40 mg Start: 11/22/19 1800 SCDs Start: 11/21/19 1351 Prophylactic Lovenox-watch platelets.    Subjective:   Lying comfortably in bed-no major issues overnight.  Denies any chest pain or shortness of breath.  More fluent-answering most of my questions appropriately-follows all my commands.   Assessment  & Plan :     Acute Hypoxic Resp Failure due to Covid 19 Viral pneumonia: Continues to improve-inflammatory markers downtrending-remains on steroids/remdesivir.  No indication for Actemra at this point.   Fever: afebrile O2 requirements:  SpO2: 92 % O2 Flow Rate (L/min): 2 L/min FiO2 (%): (!) 3 %   COVID-19 Labs: Recent Labs    11/21/19 1516 11/21/19 1516 11/22/19 0438 11/23/19 0412 11/24/19 0506  DDIMER  --   --  1.41* 2.21* 1.56*  FERRITIN 930*   < > 883* 990* 926*  LDH 388*  --   --   --   --  CRP 9.5*   < > 10.9* 7.3* 3.5*   < > = values in this interval not displayed.       Component Value Date/Time   BNP 73.2 11/21/2019 1548    Recent Labs  Lab 11/21/19 1516  PROCALCITON <0.10    Lab Results  Component Value Date   SARSCOV2NAA POSITIVE (A) 11/18/2019   SARSCOV2NAA NOT DETECTED 12/21/2018     Prone/Incentive Spirometry: encouraged  incentive spirometry use 3-4/hour.  Acute metabolic encephalopathy: Suspect secondary to hypoxemia and AKI-has significantly improved with supportive care-doubt further work-up is required.    AKI on CKD stage IIIa: Likely hemodynamically mediated-has improved with supportive measures.  Creatinine now not far from usual baseline.  Follow electrolytes periodically.  Transaminitis: Mild-trending down-likely related to COVID-19-follow.  Thrombocytopenia: Appears to be a chronic issue-watch closely  HTN: BP stable-continue to hold antihypertensives.  Depression: Continue Celexa  Constipation: Starting scheduled senna/MiraLAX-reassess on 8/10.  Deconditioning/debility: Secondary to acute illness-PT eval appreciated-recommendations of SNF.  Obesity: Estimated body mass index is 31.32 kg/m as calculated from the following:   Height as of this encounter: 5\' 7"  (1.702 m).   Weight as of this encounter: 90.7 kg.    ABG:    Component Value Date/Time   TCO2 28 01/18/2010 1531    Vent Settings: N/A    Condition - Stable  Family  Communication  : Merlene Morse ([phone]) and sister over phone conference 8/9  Code Status :  Full Code  Diet :  Diet Order            Diet heart healthy/carb modified Room service appropriate? Yes; Fluid consistency: Thin  Diet effective now                  Disposition Plan  :   Status is: Inpatient  Remains inpatient appropriate because:Inpatient level of care appropriate due to severity of illness   Dispo: The patient is from: Home              Anticipated d/c is to: SNF              Anticipated d/c date is: 3 days              Patient currently is not medically stable to d/c.   Barriers to discharge: Hypoxia requiring O2 supplementation/complete 5 days of IV Remdesivir  Antimicorbials  :    Anti-infectives (From admission, onward)   Start     Dose/Rate Route Frequency Ordered Stop   11/22/19 1000  remdesivir 100 mg in sodium chloride 0.9 % 100 mL IVPB     Discontinue    "Followed by" Linked Group Details   100 mg 200 mL/hr over 30 Minutes Intravenous Daily 11/21/19 1027 11/26/19 0959   11/22/19 1000  remdesivir 100 mg in sodium chloride 0.9 % 100 mL IVPB  Status:  Discontinued       "Followed by" Linked Group Details   100 mg 200 mL/hr over 30 Minutes Intravenous Daily 11/21/19 1353 11/21/19 1354   11/21/19 1400  remdesivir 200 mg in sodium chloride 0.9% 250 mL IVPB  Status:  Discontinued       "Followed by" Linked Group Details   200 mg 580 mL/hr over 30 Minutes Intravenous Once 11/21/19 1353 11/21/19 1354   11/21/19 1130  remdesivir 200 mg in sodium chloride 0.9% 250 mL IVPB       "Followed by" Linked Group Details   200 mg 580 mL/hr over 30 Minutes Intravenous  Once 11/21/19 1027 11/21/19 1338      Inpatient Medications  Scheduled Meds:  albuterol  2 puff Inhalation BID   vitamin C  500 mg Oral Daily   citalopram  10 mg Oral Daily   dexamethasone (DECADRON) injection  6 mg Intravenous Daily   enoxaparin (LOVENOX) injection  40 mg  Subcutaneous Q24H   zinc sulfate  220 mg Oral Daily   Continuous Infusions:  sodium chloride     remdesivir 100 mg in NS 100 mL 100 mg (11/24/19 0942)   PRN Meds:.sodium chloride, acetaminophen, chlorpheniramine-HYDROcodone, guaiFENesin-dextromethorphan, ondansetron **OR** ondansetron (ZOFRAN) IV, polyethylene glycol   Time Spent in minutes  25  See all Orders from today for further details   Oren Binet M.D on 11/24/2019 at 1:16 PM  To page go to [email or web address] - use universal password  [facility] -  Office  [phone]    Objective:   Vitals:   11/24/19 0015 11/24/19 0400 11/24/19 0730 11/24/19 1200  BP: 122/75 137/89 (!) 155/91 (!) 130/95  Pulse: 74 66 70 70  Resp: 20 20 20  (!) 22  Temp: 98.5 F (36.9 C) 98.5 F (36.9 C) 98.4 F (36.9 C) 98.1 F (36.7 C)  TempSrc: Oral Oral Oral Oral  SpO2: 90% 93% 94% 92%  Weight:      Height:        Wt Readings from Last 3 Encounters:  11/21/19 90.7 kg  11/18/19 86.2 kg  10/10/19 90.7 kg     Intake/Output Summary (Last 24 hours) at 11/24/2019 1316 Last data filed at 11/24/2019 0700 Gross per 24 hour  Intake 220 ml  Output 100 ml  Net 120 ml     Physical Exam Gen Exam:Alert awake-not in any distress HEENT:atraumatic, normocephalic Chest: B/L clear to auscultation anteriorly CVS:S1S2 regular Abdomen:soft non tender, non distended Extremities:no edema Neurology: Non focal Skin: no rash   Data Review:    CBC Recent Labs  Lab 11/18/19 1310 11/21/19 1051 11/22/19 0438 11/23/19 0412 11/24/19 0506  WBC 3.7* 4.8 4.4 5.8 8.5  HGB 13.3 13.4 13.3 13.6 12.2*  HCT 41.6 41.5 41.7 41.6 38.1*  PLT 86* PLATELET CLUMPS NOTED ON SMEAR, UNABLE TO ESTIMATE 114* 144* 149*  MCV 90.8 89.4 89.9 89.1 88.0  MCH 29.0 28.9 28.7 29.1 28.2  MCHC 32.0 32.3 31.9 32.7 32.0  RDW 13.2 13.2 13.2 13.2 13.0  LYMPHSABS 0.9 0.5* 0.4* 0.4* 0.5*  MONOABS 0.4 0.4 0.3 0.5 0.6  EOSABS 0.0 0.0 0.0 0.0 0.0  BASOSABS 0.0 0.0 0.0 0.0  0.0    Chemistries  Recent Labs  Lab 11/21/19 1051 11/21/19 1516 11/22/19 0438 11/23/19 0412 11/24/19 0506  NA 138 139 137 140 138  K 4.1 4.4 4.6 4.5 4.1  CL 100 101 100 102 103  CO2 28 25 27 27 27   GLUCOSE 120* 122* 138* 151* 137*  BUN 16 14 17  31* 26*  CREATININE 1.55* 1.28* 1.24 1.36* 1.11  CALCIUM 8.6* 8.7* 8.4* 8.5* 8.3*  MG  --   --  2.4  --   --   AST 101* 101* 84* 60* 67*  ALT 48* 53* 49* 44 58*  ALKPHOS 50 53 51 49 50  BILITOT 0.9 0.8 0.7 0.7 0.7   ------------------------------------------------------------------------------------------------------------------ No results for input(s): CHOL, HDL, LDLCALC, TRIG, CHOLHDL, LDLDIRECT in the last 72 hours.  No results found for: HGBA1C ------------------------------------------------------------------------------------------------------------------ Recent Labs    11/21/19 1516  TSH 0.697   ------------------------------------------------------------------------------------------------------------------ Recent Labs    11/21/19 1516 11/22/19 0438  11/23/19 0412 11/24/19 0506  VITAMINB12 731  --   --   --   FOLATE 26.9  --   --   --   FERRITIN 930*   < > 990* 926*   < > = values in this interval not displayed.    Coagulation profile No results for input(s): INR, PROTIME in the last 168 hours.  Recent Labs    11/23/19 0412 11/24/19 0506  DDIMER 2.21* 1.56*    Cardiac Enzymes No results for input(s): CKMB, TROPONINI, MYOGLOBIN in the last 168 hours.  Invalid input(s): CK ------------------------------------------------------------------------------------------------------------------    Component Value Date/Time   BNP 73.2 11/21/2019 1548    Micro Results Recent Results (from the past 240 hour(s))  Urine Culture     Status: None   Collection Time: 11/18/19 11:41 AM   Specimen: Urine  Result Value Ref Range Status   Source: URINE  Final   Status: FINAL  Final   Result: No Growth  Final  SARS  Coronavirus 2 by RT PCR (hospital order, performed in [place] hospital lab) Nasopharyngeal Nasopharyngeal Swab     Status: Abnormal   Collection Time: 11/18/19  9:09 PM   Specimen: Nasopharyngeal Swab  Result Value Ref Range Status   SARS Coronavirus 2 POSITIVE (A) NEGATIVE Final    Comment: RESULT CALLED TO, READ BACK BY AND VERIFIED WITH: EMAIL SENT TO L,BERDIK @2236  11/18/19 EB Performed at [facility], [place] [address], [place], [place] [address]   Culture, blood (Routine X 2) w Reflex to ID Panel     Status: None (Preliminary result)   Collection Time: 11/21/19  4:43 PM   Specimen: BLOOD  Result Value Ref Range Status   Specimen Description BLOOD SITE NOT SPECIFIED  Final   Special Requests   Final    BOTTLES DRAWN AEROBIC AND ANAEROBIC Blood Culture adequate volume   Culture   Final    NO GROWTH 3 DAYS Performed at [facility], [address] [place], [place] [address]    Report Status PENDING  Incomplete  Culture, blood (Routine X 2) w Reflex to ID Panel     Status: None (Preliminary result)   Collection Time: 11/21/19  7:30 PM   Specimen: BLOOD  Result Value Ref Range Status   Specimen Description BLOOD BLOOD LEFT FOREARM  Final   Special Requests   Final    BOTTLES DRAWN AEROBIC ONLY Blood Culture results may not be optimal due to an inadequate volume of blood received in culture bottles   Culture   Final    NO GROWTH 3 DAYS Performed at [facility], [place] [address], [place], [place] [address]    Report Status PENDING  Incomplete    Radiology Reports CT Head Wo Contrast  Result Date: 11/18/2019 CLINICAL DATA:  Neurologic deficit. EXAM: CT HEAD WITHOUT CONTRAST TECHNIQUE: Contiguous axial images were obtained from the base of the skull through the vertex without intravenous contrast. COMPARISON:  None. FINDINGS: Brain: Age related cerebral atrophy, ventriculomegaly and advanced periventricular white matter disease. The ventricles are in the midline  without mass effect or shift. No extra-axial fluid collections are identified. No CT findings for acute hemispheric infarction or intracranial hemorrhage. No mass lesions are identified. The brainstem and cerebellum are grossly normal. Vascular: Scattered vascular calcifications. No aneurysm or hyperdense vessels. Skull: No skull fracture or bone lesions. Sinuses/Orbits: The paranasal sinuses and mastoid air cells are clear. The globes are intact. Other: Mildly prominent left  temporalis muscle. No scalp hematoma or obvious laceration. IMPRESSION: 1. Age related cerebral atrophy, ventriculomegaly and advanced periventricular white matter disease. 2. No acute intracranial findings or mass lesions. Electronically Signed   By: Marijo Sanes M.D.   On: 11/18/2019 18:16   MR BRAIN WO CONTRAST  Result Date: 11/18/2019 CLINICAL DATA:  Off balance EXAM: MRI HEAD WITHOUT CONTRAST TECHNIQUE: Multiplanar, multiecho pulse sequences of the brain and surrounding structures were obtained without intravenous contrast. COMPARISON:  None. FINDINGS: Brain: There is no acute infarction or intracranial hemorrhage. There is no intracranial mass, mass effect, or edema. There is no hydrocephalus or extra-axial fluid collection. Prominence of the ventricles and sulci reflects generalized parenchymal volume loss. Patchy and confluent areas of T2 hyperintensity in the supratentorial white matter are nonspecific but may reflect moderate chronic microvascular ischemic changes. Scattered foci of susceptibility hypointensity in the peripheral cerebral hemispheres most compatible with chronic microhemorrhages. Vascular: Major vessel flow voids at the skull base are preserved. Skull and upper cervical spine: Normal marrow signal is preserved. Sinuses/Orbits: Mild mucosal thickening.  Orbits are unremarkable. Other: Sella is unremarkable.  Mastoid air cells are clear. IMPRESSION: No evidence of recent infarction, hemorrhage, or mass. Moderate  chronic microvascular ischemic changes. Mild to moderate burden of chronic microhemorrhages, which may reflect chronic hypertension or amyloid angiopathy. Electronically Signed   By: Macy Mis M.D.   On: 11/18/2019 19:41   Portable chest 1 View  Result Date: 11/21/2019 CLINICAL DATA:  COVID positive.  Weakness and shortness of breath. EXAM: PORTABLE CHEST 1 VIEW COMPARISON:  CT 11/18/2019.  Chest x-ray 11/18/2019. FINDINGS: Cardiomegaly. Tortuous thoracic aorta again noted. Prominent bilateral pulmonary infiltrates/edema noted on today's exam. No pleural effusion or pneumothorax. Degenerative change thoracic spine. IMPRESSION: 1.  Cardiomegaly.  No pulmonary venous congestion. 2. Prominent bilateral pulmonary infiltrates/edema noted on today's exam in this known COVID positive patient. Electronically Signed   By: Marcello Moores  Register   On: 11/21/2019 10:51   DG Chest Port 1 View  Result Date: 11/18/2019 CLINICAL DATA:  Hypotension, cough. EXAM: PORTABLE CHEST 1 VIEW COMPARISON:  11/26/2018 chest radiograph. FINDINGS: Hypoinflated lungs. No pneumothorax or pleural effusion. No focal airspace opacity. Increased conspicuity of the cardiomediastinal silhouette. Tortuous thoracic aorta. Multilevel spondylosis. No acute osseous abnormality. IMPRESSION: No focal airspace disease. Increased conspicuity of the cardiomediastinal silhouette compared to prior exam. While this may be artifactual accentuation secondary to hypoinflation and AP technique, consider further evaluation with CTA chest given the presence of hypotension. These results were called by telephone at the time of interpretation on 11/18/2019 at 3:54 pm to provider [facility] , who verbally acknowledged these results. Electronically Signed   By: Primitivo Gauze M.D.   On: 11/18/2019 15:57   CT ANGIO CHEST AORTA W/CM & OR WO/CM  Result Date: 11/18/2019 CLINICAL DATA:  Hypertension.  Chest pain. EXAM: CT ANGIOGRAPHY CHEST WITH CONTRAST TECHNIQUE:  Multidetector CT imaging of the chest was performed using the standard protocol during bolus administration of intravenous contrast. Multiplanar CT image reconstructions and MIPs were obtained to evaluate the vascular anatomy. CONTRAST:  172mL OMNIPAQUE IOHEXOL 350 MG/ML SOLN COMPARISON:  Chest x-ray 06/2019 FINDINGS: Cardiovascular: The heart is normal in size. No pericardial effusion. Mild tortuosity of the thoracic aorta but no aneurysm or dissection. Scattered atherosclerotic calcifications. The branch vessels are patent. Minimal scattered coronary artery calcifications. The pulmonary arterial tree is fairly well opacified. No filling defects to suggest pulmonary embolism. Mediastinum/Nodes: No mediastinal or hilar mass or lymphadenopathy. The esophagus is  grossly normal. Lungs/Pleura: Underlying mild emphysematous changes with some areas of pulmonary scarring. Patchy peripheral ground-glass infiltrates in both lungs most consistent with atypical/viral pneumonia such as COVID pneumonia. Recommend correlation with COVID 19 status. No worrisome pulmonary lesions or pleural effusions. No pulmonary edema. Moderate eventration of the left hemidiaphragm noted with some overlying vascular crowding and atelectasis. Upper Abdomen: No significant upper abdominal findings. Moderate motion artifact. Musculoskeletal: No chest wall mass, supraclavicular or axillary adenopathy. The thyroid gland is unremarkable. The bony thorax is intact. Review of the MIP images confirms the above findings. IMPRESSION: 1. No CT findings for pulmonary embolism. 2. Mild tortuosity of the thoracic aorta but no aneurysm or dissection. 3. Patchy peripheral ground-glass infiltrates in both lungs most consistent with atypical/viral pneumonia such as COVID pneumonia. Recommend correlation with COVID 19 status. 4. No mediastinal or hilar mass or adenopathy. 5. Emphysema and aortic atherosclerosis. Aortic Atherosclerosis (ICD10-I70.0) and Emphysema  (ICD10-J43.9). Electronically Signed   By: Marijo Sanes M.D.   On: 11/18/2019 18:22

## 2019-11-25 LAB — CBC WITH DIFFERENTIAL/PLATELET
Abs Immature Granulocytes: 0.11 10*3/uL — ABNORMAL HIGH (ref 0.00–0.07)
Basophils Absolute: 0 10*3/uL (ref 0.0–0.1)
Basophils Relative: 0 %
Eosinophils Absolute: 0 10*3/uL (ref 0.0–0.5)
Eosinophils Relative: 0 %
HCT: 41.8 % (ref 39.0–52.0)
Hemoglobin: 13.2 g/dL (ref 13.0–17.0)
Immature Granulocytes: 2 %
Lymphocytes Relative: 11 %
Lymphs Abs: 0.9 10*3/uL (ref 0.7–4.0)
MCH: 28 pg (ref 26.0–34.0)
MCHC: 31.6 g/dL (ref 30.0–36.0)
MCV: 88.7 fL (ref 80.0–100.0)
Monocytes Absolute: 0.5 10*3/uL (ref 0.1–1.0)
Monocytes Relative: 6 %
Neutro Abs: 6.1 10*3/uL (ref 1.7–7.7)
Neutrophils Relative %: 81 %
Platelets: 192 10*3/uL (ref 150–400)
RBC: 4.71 MIL/uL (ref 4.22–5.81)
RDW: 13 % (ref 11.5–15.5)
WBC: 7.5 10*3/uL (ref 4.0–10.5)
nRBC: 0 % (ref 0.0–0.2)

## 2019-11-25 LAB — COMPREHENSIVE METABOLIC PANEL WITH GFR
ALT: 62 U/L — ABNORMAL HIGH (ref 0–44)
AST: 57 U/L — ABNORMAL HIGH (ref 15–41)
Albumin: 2.9 g/dL — ABNORMAL LOW (ref 3.5–5.0)
Alkaline Phosphatase: 53 U/L (ref 38–126)
Anion gap: 13 (ref 5–15)
BUN: 23 mg/dL (ref 8–23)
CO2: 25 mmol/L (ref 22–32)
Calcium: 8.8 mg/dL — ABNORMAL LOW (ref 8.9–10.3)
Chloride: 104 mmol/L (ref 98–111)
Creatinine, Ser: 1.05 mg/dL (ref 0.61–1.24)
GFR calc Af Amer: >60 mL/min
GFR calc non Af Amer: >60 mL/min
Glucose, Bld: 101 mg/dL — ABNORMAL HIGH (ref 70–99)
Potassium: 4.3 mmol/L (ref 3.5–5.1)
Sodium: 142 mmol/L (ref 135–145)
Total Bilirubin: 0.7 mg/dL (ref 0.3–1.2)
Total Protein: 6.5 g/dL (ref 6.5–8.1)

## 2019-11-25 LAB — PATHOLOGIST SMEAR REVIEW: Path Review: REACTIVE

## 2019-11-25 LAB — C-REACTIVE PROTEIN: CRP: 2.4 mg/dL — ABNORMAL HIGH (ref <1.0)

## 2019-11-25 LAB — D-DIMER, QUANTITATIVE: D-Dimer, Quant: 1.5 ug{FEU}/mL — ABNORMAL HIGH (ref 0.00–0.50)

## 2019-11-25 LAB — FERRITIN: Ferritin: 759 ng/mL — ABNORMAL HIGH (ref 24–336)

## 2019-11-25 NOTE — Progress Notes (Signed)
PROGRESS NOTE                                                                                                                                                                                                             Patient Demographics:    [name], is a 74 y.o. male, DOB - Sep 06, 1945, [id number]  Outpatient Primary MD for the patient is Biagio Borg, MD   Admit date - 11/21/2019   LOS - 4  Chief Complaint  Patient presents with  . Covid Exposure  . Weakness       Brief Narrative: Patient is a 74 y.o. male with PMHx of HTN, depression, chronic thrombocytopenia, CKD stage IIIa-tested positive for COVID-19 on 8/3 (unvaccinated)-presented to the ED on 8/6 with generalized weakness, cough, confusion-upon further evaluation-he was found to have acute hypoxic respiratory failure secondary to COVID-19 pneumonia.  See below for further details.  Significant Events: 8/6>> Admit to [facility] for metabolic encephalopathy, acute hypoxic respiratory failure secondary to COVID-19 pneumonia  Significant studies: 8/3>>Chest x-ray: No focal airspace disease 8/3>> CTA chest: No PE/dissection-patchy peripheral groundglass opacities 8/3>> CT head: No acute intracranial findings 8/3>> MRI brain: No evidence of recent infarction, hemorrhage or mass 8/6>> chest x-ray: Bilateral pulmonary infiltrates 8/6>>RPR 1:2, T pallidum Ab negative  COVID-19 medications: Steroids: 8/6>> Remdesivir: 8/6>>8/10  Antibiotics: None  Microbiology data: 8/6 >>blood culture: No growth  Procedures: None  Consults: None  DVT prophylaxis: enoxaparin (LOVENOX) injection 40 mg Start: 11/22/19 1800 SCDs Start: 11/21/19 1351 Prophylactic Lovenox-watch platelets.    Subjective:   No major issues overnight-titrated to room air.  At times appears slow and confused but easily redirectable.  Significantly improved compared to how he was on  presentation.   Assessment  & Plan :   Acute Hypoxic Resp Failure due to Covid 19 Viral pneumonia: Hypoxia has resolved-completed remdesivir-continue Decadron for a few days.    Fever: afebrile O2 requirements:  SpO2: 94 % O2 Flow Rate (L/min): 2 L/min FiO2 (%): (!) 3 %   COVID-19 Labs: Recent Labs    11/23/19 0412 11/24/19 0506 11/25/19 0844  DDIMER 2.21* 1.56* 1.50*  FERRITIN 990* 926* 759*  CRP 7.3* 3.5* 2.4*       Component Value Date/Time   BNP 73.2 11/21/2019 1548    Recent Labs  Lab 11/21/19 1516  PROCALCITON <  0.10    Lab Results  Component Value Date   SARSCOV2NAA POSITIVE (A) 11/18/2019   SARSCOV2NAA NOT DETECTED 12/21/2018     Prone/Incentive Spirometry: encouraged  incentive spirometry use 3-4/hour.  Acute metabolic encephalopathy: Suspect secondary to hypoxemia and AKI-has significantly improved with supportive care-doubt further work-up is required.    AKI on CKD stage IIIa: Likely hemodynamically mediated-has improved with supportive measures.  Creatinine now not far from usual baseline.  Follow electrolytes periodically.  Elevated RPR titer: Admitting MD sent out RPR for work-up of encephalopathy-titer mildly elevated at 1:2-however Treponema pallidum antibody negative.  Case discussed with Dr. Carleene Cooper false positive RPR titer-no further work-up required.  Transaminitis: Mild-trending down-likely related to COVID-19-follow.  Thrombocytopenia: Appears to be a chronic issue-watch closely  HTN: BP stable-continue to hold antihypertensives.  Depression: Continue Celexa  Constipation: Continue MiraLAX and Senokot  Deconditioning/debility: Secondary to acute illness-PT eval appreciated-recommendations of SNF.  Obesity: Estimated body mass index is 31.42 kg/m as calculated from the following:   Height as of this encounter: 5\' 7"  (1.702 m).   Weight as of this encounter: 91 kg.    ABG:    Component Value Date/Time   TCO2 28 01/18/2010  1531    Vent Settings: N/A    Condition - Stable  Family Communication  : Merlene Morse ([phone]) and sister over phone conference 8/9  Code Status :  Full Code  Diet :  Diet Order            Diet heart healthy/carb modified Room service appropriate? Yes; Fluid consistency: Thin  Diet effective now                  Disposition Plan  :   Status is: Inpatient  Remains inpatient appropriate because:Inpatient level of care appropriate due to severity of illness   Dispo: The patient is from: Home              Anticipated d/c is to: SNF              Anticipated d/c date is: 1 days              Patient currently is  medically stable to d/c.   Barriers to discharge: Awaiting SNF  Antimicorbials  :    Anti-infectives (From admission, onward)   Start     Dose/Rate Route Frequency Ordered Stop   11/22/19 1000  remdesivir 100 mg in sodium chloride 0.9 % 100 mL IVPB       "Followed by" Linked Group Details   100 mg 200 mL/hr over 30 Minutes Intravenous Daily 11/21/19 1027 11/25/19 0923   11/22/19 1000  remdesivir 100 mg in sodium chloride 0.9 % 100 mL IVPB  Status:  Discontinued       "Followed by" Linked Group Details   100 mg 200 mL/hr over 30 Minutes Intravenous Daily 11/21/19 1353 11/21/19 1354   11/21/19 1400  remdesivir 200 mg in sodium chloride 0.9% 250 mL IVPB  Status:  Discontinued       "Followed by" Linked Group Details   200 mg 580 mL/hr over 30 Minutes Intravenous Once 11/21/19 1353 11/21/19 1354   11/21/19 1130  remdesivir 200 mg in sodium chloride 0.9% 250 mL IVPB       "Followed by" Linked Group Details   200 mg 580 mL/hr over 30 Minutes Intravenous Once 11/21/19 1027 11/21/19 1338      Inpatient Medications  Scheduled Meds: . albuterol  2 puff Inhalation BID  .  vitamin C  500 mg Oral Daily  . citalopram  10 mg Oral Daily  . dexamethasone (DECADRON) injection  6 mg Intravenous Daily  . enoxaparin (LOVENOX) injection  40 mg Subcutaneous Q24H    . polyethylene glycol  17 g Oral Daily  . senna  2 tablet Oral QHS  . zinc sulfate  220 mg Oral Daily   Continuous Infusions: . sodium chloride     PRN Meds:.sodium chloride, acetaminophen, bisacodyl, chlorpheniramine-HYDROcodone, guaiFENesin-dextromethorphan, ondansetron **OR** ondansetron (ZOFRAN) IV   Time Spent in minutes  25  See all Orders from today for further details   Oren Binet M.D on 11/25/2019 at 3:53 PM  To page go to [email or web address] - use universal password  [facility] -  Office  [phone]    Objective:   Vitals:   11/24/19 2328 11/25/19 0444 11/25/19 0724 11/25/19 1156  BP: (!) 147/92 (!) 130/95 (!) 129/91 111/78  Pulse: 66 74 71 75  Resp: 20 16 15 20   Temp: 98.8 F (37.1 C) 98.6 F (37 C) 98.1 F (36.7 C) 98.3 F (36.8 C)  TempSrc: Oral Axillary Axillary Oral  SpO2: 92% 90% 100% 94%  Weight:  91 kg    Height:        Wt Readings from Last 3 Encounters:  11/25/19 91 kg  11/18/19 86.2 kg  10/10/19 90.7 kg     Intake/Output Summary (Last 24 hours) at 11/25/2019 1553 Last data filed at 11/25/2019 1300 Gross per 24 hour  Intake 280 ml  Output --  Net 280 ml     Physical Exam Gen Exam:Alert awake-not in any distress HEENT:atraumatic, normocephalic Chest: B/L clear to auscultation anteriorly CVS:S1S2 regular Abdomen:soft non tender, non distended Extremities:no edema Neurology: Non focal Skin: no rash   Data Review:    CBC Recent Labs  Lab 11/21/19 1051 11/22/19 0438 11/23/19 0412 11/24/19 0506 11/25/19 0844  WBC 4.8 4.4 5.8 8.5 7.5  HGB 13.4 13.3 13.6 12.2* 13.2  HCT 41.5 41.7 41.6 38.1* 41.8  PLT PLATELET CLUMPS NOTED ON SMEAR, UNABLE TO ESTIMATE 114* 144* 149* 192  MCV 89.4 89.9 89.1 88.0 88.7  MCH 28.9 28.7 29.1 28.2 28.0  MCHC 32.3 31.9 32.7 32.0 31.6  RDW 13.2 13.2 13.2 13.0 13.0  LYMPHSABS 0.5* 0.4* 0.4* 0.5* 0.9  MONOABS 0.4 0.3 0.5 0.6 0.5  EOSABS 0.0 0.0 0.0 0.0 0.0  BASOSABS 0.0 0.0 0.0 0.0 0.0     Chemistries  Recent Labs  Lab 11/21/19 1516 11/22/19 0438 11/23/19 0412 11/24/19 0506 11/25/19 0844  NA 139 137 140 138 142  K 4.4 4.6 4.5 4.1 4.3  CL 101 100 102 103 104  CO2 25 27 27 27 25   GLUCOSE 122* 138* 151* 137* 101*  BUN 14 17 31* 26* 23  CREATININE 1.28* 1.24 1.36* 1.11 1.05  CALCIUM 8.7* 8.4* 8.5* 8.3* 8.8*  MG  --  2.4  --   --   --   AST 101* 84* 60* 67* 57*  ALT 53* 49* 44 58* 62*  ALKPHOS 53 51 49 50 53  BILITOT 0.8 0.7 0.7 0.7 0.7   ------------------------------------------------------------------------------------------------------------------ No results for input(s): CHOL, HDL, LDLCALC, TRIG, CHOLHDL, LDLDIRECT in the last 72 hours.  No results found for: HGBA1C ------------------------------------------------------------------------------------------------------------------ No results for input(s): TSH, T4TOTAL, T3FREE, THYROIDAB in the last 72 hours.  Invalid input(s): FREET3 ------------------------------------------------------------------------------------------------------------------ Recent Labs    11/24/19 0506 11/25/19 0844  FERRITIN 926* 759*    Coagulation profile No results for input(s): INR, PROTIME  in the last 168 hours.  Recent Labs    11/24/19 0506 11/25/19 0844  DDIMER 1.56* 1.50*    Cardiac Enzymes No results for input(s): CKMB, TROPONINI, MYOGLOBIN in the last 168 hours.  Invalid input(s): CK ------------------------------------------------------------------------------------------------------------------    Component Value Date/Time   BNP 73.2 11/21/2019 1548    Micro Results Recent Results (from the past 240 hour(s))  Urine Culture     Status: None   Collection Time: 11/18/19 11:41 AM   Specimen: Urine  Result Value Ref Range Status   Source: URINE  Final   Status: FINAL  Final   Result: No Growth  Final  SARS Coronavirus 2 by RT PCR (hospital order, performed in [facility] hospital lab) Nasopharyngeal  Nasopharyngeal Swab     Status: Abnormal   Collection Time: 11/18/19  9:09 PM   Specimen: Nasopharyngeal Swab  Result Value Ref Range Status   SARS Coronavirus 2 POSITIVE (A) NEGATIVE Final    Comment: RESULT CALLED TO, READ BACK BY AND VERIFIED WITH: EMAIL SENT TO L,BERDIK @2236  11/18/19 EB Performed at [facility], [place] [address], [place], [place] [address]   Culture, blood (Routine X 2) w Reflex to ID Panel     Status: None (Preliminary result)   Collection Time: 11/21/19  4:43 PM   Specimen: BLOOD  Result Value Ref Range Status   Specimen Description BLOOD SITE NOT SPECIFIED  Final   Special Requests   Final    BOTTLES DRAWN AEROBIC AND ANAEROBIC Blood Culture adequate volume   Culture   Final    NO GROWTH 4 DAYS Performed at [facility], [address], [place], [place] [address]    Report Status PENDING  Incomplete  Culture, blood (Routine X 2) w Reflex to ID Panel     Status: None (Preliminary result)   Collection Time: 11/21/19  7:30 PM   Specimen: BLOOD  Result Value Ref Range Status   Specimen Description BLOOD BLOOD LEFT FOREARM  Final   Special Requests   Final    BOTTLES DRAWN AEROBIC ONLY Blood Culture results may not be optimal due to an inadequate volume of blood received in culture bottles   Culture   Final    NO GROWTH 4 DAYS Performed at [facility], [place] [address] [place], [place] [address]    Report Status PENDING  Incomplete    Radiology Reports CT Head Wo Contrast  Result Date: 11/18/2019 CLINICAL DATA:  Neurologic deficit. EXAM: CT HEAD WITHOUT CONTRAST TECHNIQUE: Contiguous axial images were obtained from the base of the skull through the vertex without intravenous contrast. COMPARISON:  None. FINDINGS: Brain: Age related cerebral atrophy, ventriculomegaly and advanced periventricular white matter disease. The ventricles are in the midline without mass effect or shift. No extra-axial fluid collections are identified. No CT findings for  acute hemispheric infarction or intracranial hemorrhage. No mass lesions are identified. The brainstem and cerebellum are grossly normal. Vascular: Scattered vascular calcifications. No aneurysm or hyperdense vessels. Skull: No skull fracture or bone lesions. Sinuses/Orbits: The paranasal sinuses and mastoid air cells are clear. The globes are intact. Other: Mildly prominent left temporalis muscle. No scalp hematoma or obvious laceration. IMPRESSION: 1. Age related cerebral atrophy, ventriculomegaly and advanced periventricular white matter disease. 2. No acute intracranial findings or mass lesions. Electronically Signed   By: Marijo Sanes M.D.   On: 11/18/2019 18:16   MR BRAIN WO CONTRAST  Result Date: 11/18/2019 CLINICAL DATA:  Off balance EXAM:  MRI HEAD WITHOUT CONTRAST TECHNIQUE: Multiplanar, multiecho pulse sequences of the brain and surrounding structures were obtained without intravenous contrast. COMPARISON:  None. FINDINGS: Brain: There is no acute infarction or intracranial hemorrhage. There is no intracranial mass, mass effect, or edema. There is no hydrocephalus or extra-axial fluid collection. Prominence of the ventricles and sulci reflects generalized parenchymal volume loss. Patchy and confluent areas of T2 hyperintensity in the supratentorial white matter are nonspecific but may reflect moderate chronic microvascular ischemic changes. Scattered foci of susceptibility hypointensity in the peripheral cerebral hemispheres most compatible with chronic microhemorrhages. Vascular: Major vessel flow voids at the skull base are preserved. Skull and upper cervical spine: Normal marrow signal is preserved. Sinuses/Orbits: Mild mucosal thickening.  Orbits are unremarkable. Other: Sella is unremarkable.  Mastoid air cells are clear. IMPRESSION: No evidence of recent infarction, hemorrhage, or mass. Moderate chronic microvascular ischemic changes. Mild to moderate burden of chronic microhemorrhages, which may  reflect chronic hypertension or amyloid angiopathy. Electronically Signed   By: Macy Mis M.D.   On: 11/18/2019 19:41   Portable chest 1 View  Result Date: 11/21/2019 CLINICAL DATA:  COVID positive.  Weakness and shortness of breath. EXAM: PORTABLE CHEST 1 VIEW COMPARISON:  CT 11/18/2019.  Chest x-ray 11/18/2019. FINDINGS: Cardiomegaly. Tortuous thoracic aorta again noted. Prominent bilateral pulmonary infiltrates/edema noted on today's exam. No pleural effusion or pneumothorax. Degenerative change thoracic spine. IMPRESSION: 1.  Cardiomegaly.  No pulmonary venous congestion. 2. Prominent bilateral pulmonary infiltrates/edema noted on today's exam in this known COVID positive patient. Electronically Signed   By: Marcello Moores  Register   On: 11/21/2019 10:51   DG Chest Port 1 View  Result Date: 11/18/2019 CLINICAL DATA:  Hypotension, cough. EXAM: PORTABLE CHEST 1 VIEW COMPARISON:  11/26/2018 chest radiograph. FINDINGS: Hypoinflated lungs. No pneumothorax or pleural effusion. No focal airspace opacity. Increased conspicuity of the cardiomediastinal silhouette. Tortuous thoracic aorta. Multilevel spondylosis. No acute osseous abnormality. IMPRESSION: No focal airspace disease. Increased conspicuity of the cardiomediastinal silhouette compared to prior exam. While this may be artifactual accentuation secondary to hypoinflation and AP technique, consider further evaluation with CTA chest given the presence of hypotension. These results were called by telephone at the time of interpretation on 11/18/2019 at 3:54 pm to provider [facility] , who verbally acknowledged these results. Electronically Signed   By: Primitivo Gauze M.D.   On: 11/18/2019 15:57   CT ANGIO CHEST AORTA W/CM & OR WO/CM  Result Date: 11/18/2019 CLINICAL DATA:  Hypertension.  Chest pain. EXAM: CT ANGIOGRAPHY CHEST WITH CONTRAST TECHNIQUE: Multidetector CT imaging of the chest was performed using the standard protocol during bolus  administration of intravenous contrast. Multiplanar CT image reconstructions and MIPs were obtained to evaluate the vascular anatomy. CONTRAST:  160mL OMNIPAQUE IOHEXOL 350 MG/ML SOLN COMPARISON:  Chest x-ray 06/2019 FINDINGS: Cardiovascular: The heart is normal in size. No pericardial effusion. Mild tortuosity of the thoracic aorta but no aneurysm or dissection. Scattered atherosclerotic calcifications. The branch vessels are patent. Minimal scattered coronary artery calcifications. The pulmonary arterial tree is fairly well opacified. No filling defects to suggest pulmonary embolism. Mediastinum/Nodes: No mediastinal or hilar mass or lymphadenopathy. The esophagus is grossly normal. Lungs/Pleura: Underlying mild emphysematous changes with some areas of pulmonary scarring. Patchy peripheral ground-glass infiltrates in both lungs most consistent with atypical/viral pneumonia such as COVID pneumonia. Recommend correlation with COVID 19 status. No worrisome pulmonary lesions or pleural effusions. No pulmonary edema. Moderate eventration of the left hemidiaphragm noted with some overlying vascular crowding and atelectasis.  Upper Abdomen: No significant upper abdominal findings. Moderate motion artifact. Musculoskeletal: No chest wall mass, supraclavicular or axillary adenopathy. The thyroid gland is unremarkable. The bony thorax is intact. Review of the MIP images confirms the above findings. IMPRESSION: 1. No CT findings for pulmonary embolism. 2. Mild tortuosity of the thoracic aorta but no aneurysm or dissection. 3. Patchy peripheral ground-glass infiltrates in both lungs most consistent with atypical/viral pneumonia such as COVID pneumonia. Recommend correlation with COVID 19 status. 4. No mediastinal or hilar mass or adenopathy. 5. Emphysema and aortic atherosclerosis. Aortic Atherosclerosis (ICD10-I70.0) and Emphysema (ICD10-J43.9). Electronically Signed   By: Marijo Sanes M.D.   On: 11/18/2019 18:22

## 2019-11-25 NOTE — TOC Progression Note (Addendum)
Transition of Care [facility]) - Progression Note    Patient Details  Name: Tremon Sainvil MRN: [id number] Date of Birth: 1945-11-01  Transition of Care [facility]) CM/SW [place], LCSW Phone Number: 11/25/2019, 9:41 AM  Clinical Narrative:    9:41am-CSW awaiting response from [facility]. [facility] does not have beds available.   3pm-[place] reports patient has an MSP claim on his [place]. CSW contacted [employer] and they reported they do not see any claims from now until 2018. If claim was before that, claims are only eligible for two years and then are closed, so they are not able to keep records from before 2018. CSW sent info to [facility] for review.    Expected Discharge Plan: [place] Barriers to Discharge: Continued Medical Work up, Ship broker  Expected Discharge Plan and Services Expected Discharge Plan: [place] arrangements for the past 2 months: Single Family Home                                       Social Determinants of Health (SDOH) Interventions    Readmission Risk Interventions No flowsheet data found.

## 2019-11-25 NOTE — Plan of Care (Signed)
  Problem: Clinical Measurements: Goal: Ability to maintain clinical measurements within normal limits will improve Outcome: Progressing Goal: Will remain free from infection Outcome: Progressing Goal: Diagnostic test results will improve Outcome: Progressing Goal: Respiratory complications will improve Outcome: Progressing Goal: Cardiovascular complication will be avoided Outcome: Progressing   Problem: Activity: Goal: Risk for activity intolerance will decrease Outcome: Progressing   Problem: Nutrition: Goal: Adequate nutrition will be maintained Outcome: Progressing   Problem: Coping: Goal: Level of anxiety will decrease Outcome: Progressing   Problem: Elimination: Goal: Will not experience complications related to bowel motility Outcome: Progressing Goal: Will not experience complications related to urinary retention Outcome: Progressing   Problem: Pain Managment: Goal: General experience of comfort will improve Outcome: Progressing   Problem: Safety: Goal: Ability to remain free from injury will improve Outcome: Progressing   Problem: Skin Integrity: Goal: Risk for impaired skin integrity will decrease Outcome: Progressing   Problem: Coping: Goal: Psychosocial and spiritual needs will be supported Outcome: Progressing   Problem: Respiratory: Goal: Will maintain a patent airway Outcome: Progressing Goal: Complications related to the disease process, condition or treatment will be avoided or minimized Outcome: Progressing   Problem: Education: Goal: Knowledge of General Education information will improve Description: Including pain rating scale, medication(s)/side effects and non-pharmacologic comfort measures Outcome: Not Progressing   Problem: Health Behavior/Discharge Planning: Goal: Ability to manage health-related needs will improve Outcome: Not Progressing   Problem: Education: Goal: Knowledge of risk factors and measures for prevention of  condition will improve Outcome: Not Progressing

## 2019-11-25 NOTE — Progress Notes (Signed)
Physical Therapy Treatment Patient Details Name: [name] MRN: [id number] DOB: 02/19/1946 Today's Date: 11/25/2019    History of Present Illness Pt is a 74 y.o. male presenting 11/21/19 with productive cough, AMS, and weakness of 1 week in duration. Head CT and MRI clear. Negative for PE. Found to be Covid positive and admitted for acute hypoxemic respiratory failure due to Covid infection. PMH includes HTN, PTSD.   PT Comments    Pt slowly progressing with mobility. Able to initiate transfer and gait training, pt struggling with RW (likely uses SPC at baseline), requiring modA for mobility. Pt confused, requiring increased time to follow simple commands, totalA for washup and gown change due to urine incontinence despite attempts to involve pt in seated ADL tasks. VSS on RA. Continue to recommend SNF-level therapies to maximize functional mobility and independence prior to return home.    Follow Up Recommendations  SNF;Supervision/Assistance - 24 hour     Equipment Recommendations   (defer)    Recommendations for Other Services       Precautions / Restrictions Precautions Precautions: Fall;Other (comment) Precaution Comments: Urine incontinence Restrictions Weight Bearing Restrictions: No    Mobility  Bed Mobility Overal bed mobility: Needs Assistance             General bed mobility comments: Received sitting EOB with RN upon entry  Transfers Overall transfer level: Needs assistance Equipment used: Rolling walker (2 wheeled) Transfers: Sit to/from Stand Sit to Stand: Min assist;Mod assist         General transfer comment: Initial minA to stand from EOB to RW, repeated cues for correct hand placement and sequencing. ModA for 2x stand from recliner to RW for washup and linen change due to urine incontinence, cues for sequencing, pt assists well with BUE support  Ambulation/Gait Ambulation/Gait assistance: Mod assist Gait Distance (Feet): 4 Feet Assistive  device: Rolling walker (2 wheeled) Gait Pattern/deviations: Step-to pattern;Trunk flexed;Leaning posteriorly Gait velocity: Decreased   General Gait Details: Slow, unsteady gait from bed to recliner with RW and modA to maintain balance, repeated cues for sequencing; assist to navigate RW as pt turning body without bring RW, etc. assist to place hands on grips. Will trial without DME or with pt's SPC next time as RW does not appear natural to him   Chief Strategy Officer    Modified Rankin (Stroke Patients Only)       Balance Overall balance assessment: Needs assistance Sitting-balance support: No upper extremity supported;Feet supported Sitting balance-Leahy Scale: Fair     Standing balance support: No upper extremity supported;During functional activity Standing balance-Leahy Scale: Poor Standing balance comment: Reliant on external assist and/or UE support to maintain balance; dependent for posterior pericare                            Cognition Arousal/Alertness: Awake/alert Behavior During Therapy: Flat affect Overall Cognitive Status: No family/caregiver present to determine baseline cognitive functioning Area of Impairment: Orientation;Attention;Memory;Following commands;Safety/judgement;Awareness;Problem solving                 Orientation Level: Disoriented to;Place;Time;Situation Current Attention Level: Focused;Sustained Memory: Decreased short-term memory;Decreased recall of precautions Following Commands: Follows one step commands inconsistently;Follows one step commands with increased time Safety/Judgement: Decreased awareness of safety;Decreased awareness of deficits Awareness: Intellectual Problem Solving: Slow processing;Decreased initiation;Difficulty sequencing;Requires verbal cues;Requires tactile cues General Comments: Only oriented to self; flat affect and  slow to respond to all questions. Following majority of  simple, one-step commands with repetition; inconsistent with responses and command following      Exercises      General Comments General comments (skin integrity, edema, etc.): SpO2 >/90% on RA throughout session. Pt totalA for washup and gown change due to urine incontinence. "I just get tired so fast"      Pertinent Vitals/Pain Pain Assessment: No/denies pain    Home Living                      Prior Function            PT Goals (current goals can now be found in the care plan section) Progress towards PT goals: Progressing toward goals    Frequency    Min 2X/week      PT Plan Current plan remains appropriate    Co-evaluation              AM-PAC PT "6 Clicks" Mobility   Outcome Measure  Help needed turning from your back to your side while in a flat bed without using bedrails?: A Little Help needed moving from lying on your back to sitting on the side of a flat bed without using bedrails?: A Little Help needed moving to and from a bed to a chair (including a wheelchair)?: A Lot Help needed standing up from a chair using your arms (e.g., wheelchair or bedside chair)?: A Lot Help needed to walk in hospital room?: A Lot Help needed climbing 3-5 steps with a railing? : Total 6 Click Score: 13    End of Session   Activity Tolerance: Patient tolerated treatment well;Patient limited by fatigue Patient left: in chair;with call bell/phone within reach;with chair alarm set Nurse Communication: Mobility status PT Visit Diagnosis: Unsteadiness on feet (R26.81);Difficulty in walking, not elsewhere classified (R26.2);Muscle weakness (generalized) (M62.81)     Time: 4854-6270 PT Time Calculation (min) (ACUTE ONLY): 31 min  Charges:  $Therapeutic Activity: 23-37 mins                     Mabeline Caras, PT, DPT Acute Rehabilitation Services  Pager [phone] Office [place] 11/25/2019, 1:13 PM

## 2019-11-26 LAB — COMPREHENSIVE METABOLIC PANEL
ALT: 52 U/L — ABNORMAL HIGH (ref 0–44)
AST: 43 U/L — ABNORMAL HIGH (ref 15–41)
Albumin: 2.9 g/dL — ABNORMAL LOW (ref 3.5–5.0)
Alkaline Phosphatase: 63 U/L (ref 38–126)
Anion gap: 11 (ref 5–15)
BUN: 23 mg/dL (ref 8–23)
CO2: 25 mmol/L (ref 22–32)
Calcium: 8.7 mg/dL — ABNORMAL LOW (ref 8.9–10.3)
Chloride: 105 mmol/L (ref 98–111)
Creatinine, Ser: 1.07 mg/dL (ref 0.61–1.24)
GFR calc Af Amer: >60 mL/min (ref >60)
GFR calc non Af Amer: >60 mL/min (ref >60)
Glucose, Bld: 100 mg/dL — ABNORMAL HIGH (ref 70–99)
Potassium: 4.5 mmol/L (ref 3.5–5.1)
Sodium: 141 mmol/L (ref 135–145)
Total Bilirubin: 0.8 mg/dL (ref 0.3–1.2)
Total Protein: 6.2 g/dL — ABNORMAL LOW (ref 6.5–8.1)

## 2019-11-26 LAB — CBC WITH DIFFERENTIAL/PLATELET
Abs Immature Granulocytes: 0.07 10*3/uL (ref 0.00–0.07)
Basophils Absolute: 0 10*3/uL (ref 0.0–0.1)
Basophils Relative: 0 %
Eosinophils Absolute: 0 10*3/uL (ref 0.0–0.5)
Eosinophils Relative: 0 %
HCT: 40.2 % (ref 39.0–52.0)
Hemoglobin: 13.1 g/dL (ref 13.0–17.0)
Immature Granulocytes: 1 %
Lymphocytes Relative: 12 %
Lymphs Abs: 0.8 10*3/uL (ref 0.7–4.0)
MCH: 29 pg (ref 26.0–34.0)
MCHC: 32.6 g/dL (ref 30.0–36.0)
MCV: 88.9 fL (ref 80.0–100.0)
Monocytes Absolute: 0.5 10*3/uL (ref 0.1–1.0)
Monocytes Relative: 8 %
Neutro Abs: 5.1 10*3/uL (ref 1.7–7.7)
Neutrophils Relative %: 79 %
Platelets: 141 10*3/uL — ABNORMAL LOW (ref 150–400)
RBC: 4.52 MIL/uL (ref 4.22–5.81)
RDW: 13 % (ref 11.5–15.5)
WBC: 6.5 10*3/uL (ref 4.0–10.5)
nRBC: 0 % (ref 0.0–0.2)

## 2019-11-26 LAB — CULTURE, BLOOD (ROUTINE X 2)
Culture: NO GROWTH
Culture: NO GROWTH
Special Requests: ADEQUATE

## 2019-11-26 LAB — FERRITIN: Ferritin: 729 ng/mL — ABNORMAL HIGH (ref 24–336)

## 2019-11-26 LAB — D-DIMER, QUANTITATIVE: D-Dimer, Quant: 6.57 ug/mL-FEU — ABNORMAL HIGH (ref 0.00–0.50)

## 2019-11-26 LAB — C-REACTIVE PROTEIN: CRP: 3.8 mg/dL — ABNORMAL HIGH (ref <1.0)

## 2019-11-26 MED ORDER — ALBUTEROL SULFATE HFA 108 (90 BASE) MCG/ACT IN AERS
1.0000 | INHALATION_SPRAY | RESPIRATORY_TRACT | Status: DC | PRN
  Filled 2019-11-26: qty 6.7

## 2019-11-26 NOTE — Progress Notes (Signed)
[name]  IZT:[id number] DOB: [birth year] DOA: 11/21/2019 PCP: Biagio Borg, MD    Brief Narrative:  74 year old with a history of HTN, depression, chronic thrombocytopenia, and CKD stage IIIa who tested positive for Covid 8/3 and presented to the ED on 8/6 with generalized weakness, cough, and confusion.  In the ED he was found to be suffering with acute hypoxia and was admitted.  Significant Events:  8/3 CTa chest no evidence of PE but noted patchy peripheral groundglass opacities 8/3 CT head no acute disease 80/3 MRI brain no evidence of infarct hemorrhage or mass 8/6 admit via [place] with metabolic encephalopathy and acute hypoxia  Date of Positive COVID Test:  8/3  Vaccination Status: Unvaccinated  COVID-19 specific Treatment: Steroid 8/6 > Remdesivir 8/6 > 8/10  DVT prophylaxis: Lovenox  Antimicrobials:  None  Subjective: Resting comfortably in bed. Denies any new complaints. No resp distress. Mildly confused but pleasant and interactive.   Assessment & Plan:  Covid pneumonia with acute hypoxic respiratory failure Hypoxia now resolved -completed Remdesivir -to complete 10 days of Decadron  Recent Labs  Lab 11/21/19 1516 11/21/19 1516 11/22/19 0438 11/23/19 0412 11/24/19 0506 11/25/19 0844 11/26/19 0042  DDIMER  --   --  1.41* 2.21* 1.56* 1.50* 6.57*  FERRITIN 930*   < > 883* 990* 926* 759* 729*  CRP 9.5*   < > 10.9* 7.3* 3.5* 2.4* 3.8*  ALT 53*   < > 49* 44 58* 62* 52*  PROCALCITON <0.10  --   --   --   --   --   --    < > = values in this interval not displayed.    Signif acute increase in d-dimer  Worrisome for clotting - check venous duplex - increase to full dose lovenox for now   Acute metabolic encephalopathy Felt to be due to a combination of hypoxia and acute kidney injury - slowly improving   Acute kidney injury on CKD stage IIIa Felt to be hemodynamically mediated -has improved with support -creatinine approaching  baseline  Elevated RPR titer Titer mildly elevated at 1: 2 but Treponema pallidum antibody negative and ID suggest this is likely a false positive RPR titer with no further work-up required  Transaminitis Likely related to COVID-19 and treatment of same -improving  Chronic thrombocytopenia Stable  HTN Blood pressure reasonably controlled  Depression Continue Celexa  Deconditioning/physical debility Due to acute severe illness -SNF recommended by physical therapy  Obesity - Body mass index is 31.56 kg/m.    Code Status: FULL CODE Family Communication:  Status is: Inpatient  Remains inpatient appropriate because:Unsafe d/c plan  Dispo:  Patient From: [facility]  Planned Disposition: [place]  Expected discharge date: 11/26/19  Medically stable for discharge: Yes   Consultants:  none  Objective: Blood pressure (!) 152/95, pulse 64, temperature 97.9 F (36.6 C), temperature source Oral, resp. rate 19, height 5\' 7"  (1.702 m), weight 91.4 kg, SpO2 91 %.  Intake/Output Summary (Last 24 hours) at 11/26/2019 0942 Last data filed at 11/25/2019 1300 Gross per 24 hour  Intake 60 ml  Output --  Net 60 ml   Filed Weights   11/21/19 1111 11/25/19 0444 11/26/19 0417  Weight: 90.7 kg 91 kg 91.4 kg    Examination: General: No acute respiratory distress Lungs: Clear to auscultation bilaterally without wheezes or crackles Cardiovascular: Regular rate and rhythm without murmur gallop or rub normal S1 and S2 Abdomen: Nontender, nondistended, soft, bowel sounds positive,  no rebound, no ascites, no appreciable mass Extremities: No significant cyanosis, clubbing, or edema bilateral lower extremities  CBC: Recent Labs  Lab 11/24/19 0506 11/25/19 0844 11/26/19 0042  WBC 8.5 7.5 6.5  NEUTROABS 7.4 6.1 5.1  HGB 12.2* 13.2 13.1  HCT 38.1* 41.8 40.2  MCV 88.0 88.7 88.9  PLT 149* 192 914*   Basic Metabolic Panel: Recent Labs  Lab  11/22/19 0438 11/23/19 0412 11/24/19 0506 11/25/19 0844 11/26/19 0042  NA 137   < > 138 142 141  K 4.6   < > 4.1 4.3 4.5  CL 100   < > 103 104 105  CO2 27   < > 27 25 25   GLUCOSE 138*   < > 137* 101* 100*  BUN 17   < > 26* 23 23  CREATININE 1.24   < > 1.11 1.05 1.07  CALCIUM 8.4*   < > 8.3* 8.8* 8.7*  MG 2.4  --   --   --   --   PHOS 4.1  --   --   --   --    < > = values in this interval not displayed.   GFR: Estimated Creatinine Clearance: 65.3 mL/min (by C-G formula based on SCr of 1.07 mg/dL).  Liver Function Tests: Recent Labs  Lab 11/23/19 0412 11/24/19 0506 11/25/19 0844 11/26/19 0042  AST 60* 67* 57* 43*  ALT 44 58* 62* 52*  ALKPHOS 49 50 53 63  BILITOT 0.7 0.7 0.7 0.8  PROT 6.1* 5.9* 6.5 6.2*  ALBUMIN 2.7* 2.7* 2.9* 2.9*   No results for input(s): LIPASE, AMYLASE in the last 168 hours. Recent Labs  Lab 11/21/19 1516  AMMONIA 18    Coagulation Profile: No results for input(s): INR, PROTIME in the last 168 hours.  Cardiac Enzymes: No results for input(s): CKTOTAL, CKMB, CKMBINDEX, TROPONINI in the last 168 hours.  HbA1C: No results found for: HGBA1C  CBG: No results for input(s): GLUCAP in the last 168 hours.  Recent Results (from the past 240 hour(s))  Urine Culture     Status: None   Collection Time: 11/18/19 11:41 AM   Specimen: Urine  Result Value Ref Range Status   Source: URINE  Final   Status: FINAL  Final   Result: No Growth  Final  SARS Coronavirus 2 by RT PCR (hospital order, performed in [facility] hospital lab) Nasopharyngeal Nasopharyngeal Swab     Status: Abnormal   Collection Time: 11/18/19  9:09 PM   Specimen: Nasopharyngeal Swab  Result Value Ref Range Status   SARS Coronavirus 2 POSITIVE (A) NEGATIVE Final    Comment: RESULT CALLED TO, READ BACK BY AND VERIFIED WITH: EMAIL SENT TO L,BERDIK @2236  11/18/19 EB Performed at [facility], [place] [address], [place], [place] [address]   Culture, blood (Routine X 2) w Reflex  to ID Panel     Status: None (Preliminary result)   Collection Time: 11/21/19  4:43 PM   Specimen: BLOOD  Result Value Ref Range Status   Specimen Description BLOOD SITE NOT SPECIFIED  Final   Special Requests   Final    BOTTLES DRAWN AEROBIC AND ANAEROBIC Blood Culture adequate volume   Culture   Final    NO GROWTH 4 DAYS Performed at [facility], [address], [place], [place] [address]    Report Status PENDING  Incomplete  Culture, blood (Routine X 2) w Reflex to ID Panel     Status: None (Preliminary  result)   Collection Time: 11/21/19  7:30 PM   Specimen: BLOOD  Result Value Ref Range Status   Specimen Description BLOOD BLOOD LEFT FOREARM  Final   Special Requests   Final    BOTTLES DRAWN AEROBIC ONLY Blood Culture results may not be optimal due to an inadequate volume of blood received in culture bottles   Culture   Final    NO GROWTH 4 DAYS Performed at [facility], [place] [address], [place], [place] [address]    Report Status PENDING  Incomplete     Scheduled Meds: . albuterol  2 puff Inhalation BID  . vitamin C  500 mg Oral Daily  . citalopram  10 mg Oral Daily  . dexamethasone (DECADRON) injection  6 mg Intravenous Daily  . enoxaparin (LOVENOX) injection  40 mg Subcutaneous Q24H  . polyethylene glycol  17 g Oral Daily  . senna  2 tablet Oral QHS  . zinc sulfate  220 mg Oral Daily   Continuous Infusions: . sodium chloride       LOS: 5 days   Cherene Altes, MD [facility] Office  [phone] Pager - Text Page per Amion  If 7PM-7AM, please contact night-coverage per Amion 11/26/2019, 9:42 AM

## 2019-11-26 NOTE — Plan of Care (Signed)
  Problem: Education: Goal: Knowledge of General Education information will improve Description: Including pain rating scale, medication(s)/side effects and non-pharmacologic comfort measures Outcome: Progressing   Problem: Health Behavior/Discharge Planning: Goal: Ability to manage health-related needs will improve Outcome: Progressing   Problem: Clinical Measurements: Goal: Ability to maintain clinical measurements within normal limits will improve Outcome: Progressing Goal: Will remain free from infection Outcome: Progressing Goal: Diagnostic test results will improve Outcome: Progressing Goal: Respiratory complications will improve Outcome: Progressing Goal: Cardiovascular complication will be avoided Outcome: Progressing   Problem: Activity: Goal: Risk for activity intolerance will decrease Outcome: Progressing   Problem: Nutrition: Goal: Adequate nutrition will be maintained Outcome: Progressing   Problem: Coping: Goal: Level of anxiety will decrease Outcome: Progressing   Problem: Elimination: Goal: Will not experience complications related to urinary retention Outcome: Progressing   Problem: Pain Managment: Goal: General experience of comfort will improve Outcome: Progressing   Problem: Safety: Goal: Ability to remain free from injury will improve Outcome: Progressing   Problem: Skin Integrity: Goal: Risk for impaired skin integrity will decrease Outcome: Progressing   Problem: Education: Goal: Knowledge of risk factors and measures for prevention of condition will improve Outcome: Progressing   Problem: Coping: Goal: Psychosocial and spiritual needs will be supported Outcome: Progressing   Problem: Respiratory: Goal: Will maintain a patent airway Outcome: Progressing Goal: Complications related to the disease process, condition or treatment will be avoided or minimized Outcome: Progressing   Problem: Elimination: Goal: Will not experience  complications related to bowel motility Outcome: Not Progressing

## 2019-11-26 NOTE — TOC Progression Note (Signed)
Transition of Care [facility]) - Progression Note    Patient Details  Name: Jackey Housey MRN: [id number] Date of Birth: 06/18/1945  Transition of Care [facility]) CM/SW [place], LCSW Phone Number: 11/26/2019, 2:57 PM  Clinical Narrative:    CSW spoke with patient regardin [facility] listed on his insurance. He stated that he does not remember what happened to have that on the insurance but will ask his family. CSW requested [facility] re-consider patient since there is no case to close according to [facility].    Expected Discharge Plan: Skilled Nursing Facility Barriers to Discharge: SNF Pending bed offer, Insurance Authorization, Continued Medical Work up  Expected Discharge Plan and Services Expected Discharge Plan: [place] arrangements for the past 2 months: Single Family Home                                       Social Determinants of Health (SDOH) Interventions    Readmission Risk Interventions No flowsheet data found.

## 2019-11-27 ENCOUNTER — Inpatient Hospital Stay (HOSPITAL_COMMUNITY): Payer: BC Managed Care – PPO

## 2019-11-27 DIAGNOSIS — R7989 Other specified abnormal findings of blood chemistry: Secondary | ICD-10-CM

## 2019-11-27 DIAGNOSIS — L8990 Pressure ulcer of unspecified site, unspecified stage: Secondary | ICD-10-CM | POA: Insufficient documentation

## 2019-11-27 LAB — GLUCOSE, CAPILLARY
Glucose-Capillary: 84 mg/dL (ref 70–99)
Glucose-Capillary: 117 mg/dL — ABNORMAL HIGH (ref 70–99)
Glucose-Capillary: 115 mg/dL — ABNORMAL HIGH (ref 70–99)

## 2019-11-27 NOTE — Progress Notes (Signed)
Physical Therapy Treatment Patient Details Name: [name] MRN: [id number] DOB: 07/20/1945 Today's Date: 11/27/2019    History of Present Illness Pt is a 74 y.o. male presenting 11/21/19 with productive cough, AMS, and weakness of 1 week in duration. Head CT and MRI clear. Negative for PE. Found to be Covid positive and admitted for acute hypoxemic respiratory failure due to Covid infection. PMH includes HTN, PTSD.    PT Comments    Limited session due to RN needing to get IV access for ordered CT scan.  Pt was able to move to EOB slowly and with much difficulty following through on basic commands.  Heavy min assist to stand and reported dizziness in standing.  Pt on RA and did not appear in acute distress, VSS.  Pt could not give more detail as to if he was lightheaded or spinning.  Multimodal cues needed to help pt sequence mobility throughout session. PT will continue to follow acutely for safe mobility progression.   Follow Up Recommendations  SNF     Equipment Recommendations  Rolling walker with 5" wheels    Recommendations for Other Services       Precautions / Restrictions Precautions Precautions: Fall;Other (comment) Precaution Comments: Urine incontinence    Mobility  Bed Mobility Overal bed mobility: Needs Assistance Bed Mobility: Supine to Sit;Sit to Supine     Supine to sit: Min assist Sit to supine: Min assist   General bed mobility comments: Min assist for trunk support and to initiate movements.  Multimodal cues to finally get follow through and understanding.   Transfers Overall transfer level: Needs assistance Equipment used: Rolling walker (2 wheeled) Transfers: Sit to/from Stand Sit to Stand: Min assist         General transfer comment: Heavy min assist to come to standing to RW EOB.  posterior preference.    Ambulation/Gait                 Stairs             Wheelchair Mobility    Modified Rankin (Stroke Patients Only)        Balance Overall balance assessment: Needs assistance Sitting-balance support: Feet supported;No upper extremity supported Sitting balance-Leahy Scale: Fair     Standing balance support: Bilateral upper extremity supported Standing balance-Leahy Scale: Poor Standing balance comment: Stood EOB for ~ 2 mins attempting to urinate in the urinal.  Pt reporting dizziness, however VSS and he was not able to report more specifically if it was lightheadedness or spinning.                             Cognition Arousal/Alertness: Awake/alert Behavior During Therapy: Flat affect Overall Cognitive Status: No family/caregiver present to determine baseline cognitive functioning Area of Impairment: Orientation;Attention;Memory;Following commands;Safety/judgement;Awareness;Problem solving                 Orientation Level: Disoriented to;Place;Time;Situation Current Attention Level: Sustained Memory: Decreased recall of precautions;Decreased short-term memory Following Commands: Follows one step commands inconsistently;Follows one step commands with increased time Safety/Judgement: Decreased awareness of safety;Decreased awareness of deficits Awareness: Intellectual Problem Solving: Slow processing;Decreased initiation;Difficulty sequencing;Requires verbal cues;Requires tactile cues General Comments: Pt with difficulty following even basic commands, unable to sequence through tasks without multimodal cues.        Exercises      General Comments General comments (skin integrity, edema, etc.): Pt on RA throughout session.   Session limited by RN  needing to get IV access for CT scan.      Pertinent Vitals/Pain Pain Assessment: No/denies pain    Home Living                      Prior Function            PT Goals (current goals can now be found in the care plan section) Progress towards PT goals: Progressing toward goals    Frequency    Min  2X/week      PT Plan Current plan remains appropriate    Co-evaluation              AM-PAC PT "6 Clicks" Mobility   Outcome Measure  Help needed turning from your back to your side while in a flat bed without using bedrails?: A Little Help needed moving from lying on your back to sitting on the side of a flat bed without using bedrails?: A Little Help needed moving to and from a bed to a chair (including a wheelchair)?: A Little Help needed standing up from a chair using your arms (e.g., wheelchair or bedside chair)?: A Little Help needed to walk in hospital room?: A Lot Help needed climbing 3-5 steps with a railing? : Total 6 Click Score: 15    End of Session Equipment Utilized During Treatment: Gait belt Activity Tolerance: Patient tolerated treatment well Patient left: in bed;with call bell/phone within reach;with nursing/sitter in room Nurse Communication: Mobility status PT Visit Diagnosis: Unsteadiness on feet (R26.81);Difficulty in walking, not elsewhere classified (R26.2);Muscle weakness (generalized) (M62.81)     Time: 1740-8144 PT Time Calculation (min) (ACUTE ONLY): 16 min  Charges:  $Therapeutic Activity: 8-22 mins                     Verdene Lennert, PT, DPT  Acute Rehabilitation [phone] pager [phone]) [phone] office

## 2019-11-27 NOTE — TOC Progression Note (Signed)
Transition of Care [facility]) - Progression Note    Patient Details  Name: [name] MRN: [id number] Date of Birth: 01-25-1946  Transition of Care [facility]) CM/SW [place], [place] Phone Number: 11/27/2019, 2:00 PM  Clinical Narrative:    [facility] still reviewing. No other COVID accepting SNFs at this time.    Expected Discharge Plan: Skilled Nursing Facility Barriers to Discharge: SNF Pending bed offer, Insurance Authorization, Continued Medical Work up  Expected Discharge Plan and Services Expected Discharge Plan: [place] arrangements for the past 2 months: Single Family Home                                       Social Determinants of Health (SDOH) Interventions    Readmission Risk Interventions No flowsheet data found.

## 2019-11-27 NOTE — Progress Notes (Signed)
[name]  JQB:[id number] DOB: 1945-08-23 DOA: 11/21/2019 PCP: Biagio Borg, MD    Brief Narrative:  74 year old with a history of HTN, depression, chronic thrombocytopenia, and CKD stage IIIa who tested positive for Covid 8/3 and presented to the ED on 8/6 with generalized weakness, cough, and confusion.  In the ED he was found to be suffering with acute hypoxia and was admitted.  Significant Events:  8/3 CTa chest no evidence of PE but noted patchy peripheral groundglass opacities 8/3 CT head no acute disease 80/3 MRI brain no evidence of infarct hemorrhage or mass 8/6 admit via [place] with metabolic encephalopathy and acute hypoxia  Date of Positive COVID Test:  8/3  Vaccination Status: Unvaccinated  COVID-19 specific Treatment: Steroid 8/6 > Remdesivir 8/6 > 8/10  DVT prophylaxis: Lovenox  Antimicrobials:  None  Subjective: Vital signs are stable and the patient has a normal O2 sat on room air.  Resting comfortably in bed  Assessment & Plan:  Covid pneumonia with acute hypoxic respiratory failure Hypoxia now resolved -completed Remdesivir -to complete 10 days of Decadron  Recent Labs  Lab 11/21/19 1516 11/21/19 1516 11/22/19 0438 11/23/19 0412 11/24/19 0506 11/25/19 0844 11/26/19 0042  DDIMER  --   --  1.41* 2.21* 1.56* 1.50* 6.57*  FERRITIN 930*   < > 883* 990* 926* 759* 729*  CRP 9.5*   < > 10.9* 7.3* 3.5* 2.4* 3.8*  ALT 53*   < > 49* 44 58* 62* 52*  PROCALCITON <0.10  --   --   --   --   --   --    < > = values in this interval not displayed.    Signif acute increase in d-dimer  Worrisome for clotting -no evidence of DVT on venous duplex - cont full dose lovenox - check CTa to rule out PE - f/u d-dimer in AM   Acute metabolic encephalopathy Felt to be due to a combination of hypoxia and acute kidney injury - slowly improving - d/c steroid early in hopes of assisting w/ recovery     Acute kidney injury on CKD stage IIIa Felt to be  hemodynamically mediated -has improved with support -creatinine approaching baseline  Recent Labs  Lab 11/22/19 0438 11/23/19 0412 11/24/19 0506 11/25/19 0844 11/26/19 0042  CREATININE 1.24 1.36* 1.11 1.05 1.07     Elevated RPR titer Titer mildly elevated at 1: 2 but Treponema pallidum antibody negative and ID suggest this is likely a false positive RPR titer with no further work-up required  Transaminitis Likely related to COVID-19 and treatment of same -improving  Recent Labs  Lab 11/22/19 0438 11/23/19 0412 11/24/19 0506 11/25/19 0844 11/26/19 0042  AST 84* 60* 67* 57* 43*  ALT 49* 44 58* 62* 52*  ALKPHOS 51 49 50 53 63  BILITOT 0.7 0.7 0.7 0.7 0.8  PROT 6.8 6.1* 5.9* 6.5 6.2*  ALBUMIN 3.0* 2.7* 2.7* 2.9* 2.9*     Chronic thrombocytopenia Stable  HTN Blood pressure reasonably controlled  Depression Continue Celexa  Deconditioning/physical debility Due to acute severe illness -SNF recommended by physical therapy  Obesity - Body mass index is 31.56 kg/m.    Code Status: FULL CODE Family Communication:  Status is: Inpatient  Remains inpatient appropriate because:Unsafe d/c plan  Dispo:  Patient From: [place]  Planned Disposition: [place]  Expected discharge date: 11/26/19  Medically stable for discharge: Yes   Consultants:  none  Objective: Blood pressure 98/77, pulse 64, temperature  53 F (36.7 C), temperature source Oral, resp. rate 18, height 5\' 7"  (1.702 m), weight 91.4 kg, SpO2 94 %.  Intake/Output Summary (Last 24 hours) at 11/27/2019 0929 Last data filed at 11/26/2019 1700 Gross per 24 hour  Intake 60 ml  Output --  Net 60 ml   Filed Weights   11/21/19 1111 11/25/19 0444 11/26/19 0417  Weight: 90.7 kg 91 kg 91.4 kg    Examination: General: No acute respiratory distress Lungs: Clear to auscultation bilaterally  Cardiovascular: RRR without murmur or rub Abdomen: NT/ND, soft Extremities: No  significant cyanosis, clubbing, or edema bilateral lower extremities  CBC: Recent Labs  Lab 11/24/19 0506 11/25/19 0844 11/26/19 0042  WBC 8.5 7.5 6.5  NEUTROABS 7.4 6.1 5.1  HGB 12.2* 13.2 13.1  HCT 38.1* 41.8 40.2  MCV 88.0 88.7 88.9  PLT 149* 192 161*   Basic Metabolic Panel: Recent Labs  Lab 11/22/19 0438 11/23/19 0412 11/24/19 0506 11/25/19 0844 11/26/19 0042  NA 137   < > 138 142 141  K 4.6   < > 4.1 4.3 4.5  CL 100   < > 103 104 105  CO2 27   < > 27 25 25   GLUCOSE 138*   < > 137* 101* 100*  BUN 17   < > 26* 23 23  CREATININE 1.24   < > 1.11 1.05 1.07  CALCIUM 8.4*   < > 8.3* 8.8* 8.7*  MG 2.4  --   --   --   --   PHOS 4.1  --   --   --   --    < > = values in this interval not displayed.   GFR: Estimated Creatinine Clearance: 65.3 mL/min (by C-G formula based on SCr of 1.07 mg/dL).  Liver Function Tests: Recent Labs  Lab 11/23/19 0412 11/24/19 0506 11/25/19 0844 11/26/19 0042  AST 60* 67* 57* 43*  ALT 44 58* 62* 52*  ALKPHOS 49 50 53 63  BILITOT 0.7 0.7 0.7 0.8  PROT 6.1* 5.9* 6.5 6.2*  ALBUMIN 2.7* 2.7* 2.9* 2.9*    Recent Labs  Lab 11/21/19 1516  AMMONIA 18    CBG: Recent Labs  Lab 11/27/19 0757  GLUCAP 84    Recent Results (from the past 240 hour(s))  Urine Culture     Status: None   Collection Time: 11/18/19 11:41 AM   Specimen: Urine  Result Value Ref Range Status   Source: URINE  Final   Status: FINAL  Final   Result: No Growth  Final  SARS Coronavirus 2 by RT PCR (hospital order, performed in [place] hospital lab) Nasopharyngeal Nasopharyngeal Swab     Status: Abnormal   Collection Time: 11/18/19  9:09 PM   Specimen: Nasopharyngeal Swab  Result Value Ref Range Status   SARS Coronavirus 2 POSITIVE (A) NEGATIVE Final    Comment: RESULT CALLED TO, READ BACK BY AND VERIFIED WITH: EMAIL SENT TO L,BERDIK @2236  11/18/19 EB Performed at [facility], [address], [place], [place] [address]   Culture, blood (Routine X  2) w Reflex to ID Panel     Status: None   Collection Time: 11/21/19  4:43 PM   Specimen: BLOOD  Result Value Ref Range Status   Specimen Description BLOOD SITE NOT SPECIFIED  Final   Special Requests   Final    BOTTLES DRAWN AEROBIC AND ANAEROBIC Blood Culture adequate volume   Culture   Final    NO GROWTH 5  DAYS Performed at [facility], [place] [address], [place], [place] [address]    Report Status 11/26/2019 FINAL  Final  Culture, blood (Routine X 2) w Reflex to ID Panel     Status: None   Collection Time: 11/21/19  7:30 PM   Specimen: BLOOD  Result Value Ref Range Status   Specimen Description BLOOD BLOOD LEFT FOREARM  Final   Special Requests   Final    BOTTLES DRAWN AEROBIC ONLY Blood Culture results may not be optimal due to an inadequate volume of blood received in culture bottles   Culture   Final    NO GROWTH 5 DAYS Performed at [facility], [place] [address], [place], [place] [address]    Report Status 11/26/2019 FINAL  Final     Scheduled Meds:  vitamin C  500 mg Oral Daily   citalopram  10 mg Oral Daily   dexamethasone (DECADRON) injection  6 mg Intravenous Daily   enoxaparin (LOVENOX) injection  40 mg Subcutaneous Q24H   polyethylene glycol  17 g Oral Daily   senna  2 tablet Oral QHS   zinc sulfate  220 mg Oral Daily      LOS: 6 days   Cherene Altes, MD [facility] Office  [phone] Pager - Text Page per Shea Evans  If 7PM-7AM, please contact night-coverage per Amion 11/27/2019, 9:29 AM

## 2019-11-27 NOTE — Progress Notes (Signed)
11/27/19 0755  What Happened  Was fall witnessed? No  Was patient injured? No  Patient found on floor  Found by Staff-comment  Stated prior activity other (comment) (in bed)  Follow Up  MD notified Dr. Thereasa Solo  Time MD notified 0800  Simple treatment Ice  Progress note created (see row info) Yes  Adult Fall Risk Assessment  Risk Factor Category (scoring not indicated) Fall has occurred during this admission (document High fall risk)  Patient Fall Risk Level High fall risk  Adult Fall Risk Interventions  Required Bundle Interventions *See Row Information* High fall risk - low, moderate, and high requirements implemented  Additional Interventions PT/OT need assessed if change in mobility from baseline;Reorient/diversional activities with confused patients;Use of appropriate toileting equipment (bedpan, BSC, etc.)  Screening for Fall Injury Risk (To be completed on HIGH fall risk patients) - Assessing Need for Low Bed  Risk For Fall Injury- Low Bed Criteria Previous fall this admission  Will Implement Low Bed and Floor Mats Yes  Screening for Fall Injury Risk (To be completed on HIGH fall risk patients who do not meet crieteria for Low Bed) - Assessing Need for Floor Mats Only  Risk For Fall Injury- Criteria for Floor Mats None identified - No additional interventions needed  Vitals  Temp 98 F (36.7 C)  Temp Source Oral  BP 98/77  MAP (mmHg) 85  BP Location Left Arm  BP Method Automatic  Patient Position (if appropriate) Lying  Oxygen Therapy  SpO2 95 %  O2 Device Room Air  Patient Activity (if Appropriate) In bed  Pulse Oximetry Type Intermittent  Pain Assessment  Pain Scale 0-10  Pain Score 0  Neurological  Neuro (WDL) X  Level of Consciousness Alert  Orientation Level Oriented X4  Cognition Follows commands;Impulsive;Poor attention/concentration;Poor judgement;Poor safety awareness;Memory impairment  Speech Clear  Pupil Assessment  Yes  R Pupil Size (mm) 3  R  Pupil Shape Round  R Pupil Reaction Brisk  L Pupil Size (mm) 3  L Pupil Shape Round  L Pupil Reaction Brisk  Additional Pupil Assessments No  Motor Function/Sensation Assessment Grip;Elbow extension;Elbow flexion;Motor response;Motor strength  Facial Symmetry Symmetrical  R Hand Grip Present;Strong  L Hand Grip Present;Strong  R Elbow Extension (Push/Biceps) Present  L Elbow Extension (Push/Biceps) Present  R Elbow Flexion (Pull/Triceps) Present  L Elbow Flexion (Pull/Triceps) Present  RUE Motor Response Purposeful movement  RUE Motor Strength 5  LUE Motor Response Purposeful movement  LUE Motor Strength 5  RLE Motor Response Purposeful movement  RLE Motor Strength 4  LLE Motor Response Purposeful movement  LLE Motor Strength 4  Neuro Symptoms Forgetful  Reflexes  Gag Present  Cough Present  Glasgow Coma Scale  Eye Opening 4  Best Verbal Response (NON-intubated) 5  Best Motor Response 6  Glasgow Coma Scale Score 15  Musculoskeletal  Musculoskeletal (WDL) X  Assistive Device [place];Clarise Cruz Lift  Generalized Weakness Yes  Weight Bearing Restrictions No  Integumentary  Integumentary (WDL) X  Skin Color Appropriate for ethnicity  Skin Condition Dry;Flaky  Skin Integrity Other (Comment) (stage 1 pressure ulcer)  Skin Turgor Non-tenting   Nurse tech went into room and found the patient on the floor. The tech then called for me to let me know he had fallen. I went into the room and the patient was on the floor. I asked him if he was hurting anywhere and the patient stated that he was not. I then notified the charge nurse. With the assistance  of the charge nurse and another nurse we got him back into bed. I assessed the patient for any skin issues, none were seen. The patient was able to answer all orientation questions and showed no physical signs of pain. The bed alarm is on, bedside table, call light and phone were all left within reach of the patient.

## 2019-11-27 NOTE — Progress Notes (Signed)
Bilateral lower extremity venous duplex has been completed. Preliminary results can be found in CV Proc through chart review.   11/27/19 2:45 PM [name] RVT

## 2019-11-28 ENCOUNTER — Inpatient Hospital Stay (HOSPITAL_COMMUNITY): Payer: BC Managed Care – PPO

## 2019-11-28 LAB — D-DIMER, QUANTITATIVE: D-Dimer, Quant: 1.15 ug/mL-FEU — ABNORMAL HIGH (ref 0.00–0.50)

## 2019-11-28 LAB — BASIC METABOLIC PANEL
Anion gap: 9 (ref 5–15)
BUN: 20 mg/dL (ref 8–23)
CO2: 26 mmol/L (ref 22–32)
Calcium: 8.9 mg/dL (ref 8.9–10.3)
Chloride: 102 mmol/L (ref 98–111)
Creatinine, Ser: 1.13 mg/dL (ref 0.61–1.24)
GFR calc Af Amer: >60 mL/min (ref >60)
GFR calc non Af Amer: >60 mL/min (ref >60)
Glucose, Bld: 103 mg/dL — ABNORMAL HIGH (ref 70–99)
Potassium: 4.4 mmol/L (ref 3.5–5.1)
Sodium: 137 mmol/L (ref 135–145)

## 2019-11-28 MED ORDER — BENZONATATE 100 MG PO CAPS: 100.0000 mg | ORAL_CAPSULE | Freq: Three times a day (TID) | ORAL | Status: DC | PRN

## 2019-11-28 MED ORDER — IOHEXOL 350 MG/ML SOLN
100.0000 mL | Freq: Once | INTRAVENOUS | Status: AC | PRN
  Administered 2019-11-28: 100 mL via INTRAVENOUS

## 2019-11-28 NOTE — Progress Notes (Signed)
Patient transported to 6N via bed by this RN and Rod Holler, NT. Patient alert, no signs of distress noted.

## 2019-11-28 NOTE — Progress Notes (Signed)
[name]  SFK:[id number] DOB: 1946-04-04 DOA: 11/21/2019 PCP: Biagio Borg, MD    Brief Narrative:  74 year old with a history of HTN, depression, chronic thrombocytopenia, and CKD stage IIIa who tested positive for Covid 8/3 and presented to the ED on 8/6 with generalized weakness, cough, and confusion.  In the ED he was found to be suffering with acute hypoxia and was admitted.  Significant Events:  8/3 CTa chest no evidence of PE but noted patchy peripheral groundglass opacities 8/3 CT head no acute disease 80/3 MRI brain no evidence of infarct hemorrhage or mass 8/6 admit via [place] with metabolic encephalopathy and acute hypoxia  Date of Positive COVID Test:  8/3  Vaccination Status: Unvaccinated  COVID-19 specific Treatment: Steroid 8/6 > Remdesivir 8/6 > 8/10  DVT prophylaxis: Lovenox  Antimicrobials:  None  Subjective: Vital signs stable with saturations 96% on room air.  CTa 8/13 revealed no evidence of a pulmonary embolism.  As of today the patient is status post 10 days from his positive Covid test.  His symptoms have dramatically improved and he can therefore be taken off isolation.  Assessment & Plan:  Covid pneumonia with acute hypoxic respiratory failure Hypoxia now resolved - completed Remdesivir - steroid course discontinued after 7 days as patient clinically much improved and no longer hypoxic  Signif acute increase in d-dimer  no evidence of DVT on venous duplex - CTa without evidence of PE -D-dimer has acutely declined -resume prophylactic dose Lovenox only  Acute metabolic encephalopathy Felt to be due to a combination of hypoxia and acute kidney injury - slowly improving -stopped steroid early in hopes of assisting w/ recovery     Acute kidney injury on CKD stage IIIa Felt to be hemodynamically mediated -has improved with support   Recent Labs  Lab 11/23/19 0412 11/24/19 0506 11/25/19 0844 11/26/19 0042 11/28/19 0312  CREATININE  1.36* 1.11 1.05 1.07 1.13     Elevated RPR titer Titer mildly elevated at 1: 2 but Treponema pallidum antibody negative and ID suggest this is likely a false positive RPR titer with no further work-up required  Transaminitis Likely related to COVID-19 and treatment of same -improving  Recent Labs  Lab 11/22/19 0438 11/23/19 0412 11/24/19 0506 11/25/19 0844 11/26/19 0042  AST 84* 60* 67* 57* 43*  ALT 49* 44 58* 62* 52*  ALKPHOS 51 49 50 53 63  BILITOT 0.7 0.7 0.7 0.7 0.8  PROT 6.8 6.1* 5.9* 6.5 6.2*  ALBUMIN 3.0* 2.7* 2.7* 2.9* 2.9*     Chronic thrombocytopenia Stable  HTN Blood pressure reasonably controlled  Depression Continue Celexa  Deconditioning/physical debility Due to acute severe illness - SNF recommended by physical therapy -medically stable for discharge and simply awaiting SNF bed -of note the patient no longer requires isolation for Covid  Obesity - Body mass index is 31.56 kg/m.    Code Status: FULL CODE Family Communication:  Status is: Inpatient  Remains inpatient appropriate because:Unsafe d/c plan  Dispo:  Patient From: [place]  Planned Disposition: [place]  Expected discharge date: 11/26/19  Medically stable for discharge: Yes   Consultants:  none  Objective: Blood pressure (!) 140/94, pulse 69, temperature 98.1 F (36.7 C), temperature source Oral, resp. rate 20, height 5\' 7"  (1.702 m), weight 91.4 kg, SpO2 96 %.  Intake/Output Summary (Last 24 hours) at 11/28/2019 0841 Last data filed at 11/28/2019 0528 Gross per 24 hour  Intake 240 ml  Output 75 ml  Net  165 ml   Filed Weights   11/21/19 1111 11/25/19 0444 11/26/19 0417  Weight: 90.7 kg 91 kg 91.4 kg    Examination: General: No acute respiratory distress Lungs: Clear to auscultation bilaterally without wheezing Cardiovascular: RRR without murmur or rub Abdomen: NT/ND, soft Extremities: No C/C/E B LE   CBC: Recent Labs  Lab  11/24/19 0506 11/25/19 0844 11/26/19 0042  WBC 8.5 7.5 6.5  NEUTROABS 7.4 6.1 5.1  HGB 12.2* 13.2 13.1  HCT 38.1* 41.8 40.2  MCV 88.0 88.7 88.9  PLT 149* 192 160*   Basic Metabolic Panel: Recent Labs  Lab 11/22/19 0438 11/23/19 0412 11/25/19 0844 11/26/19 0042 11/28/19 0312  NA 137   < > 142 141 137  K 4.6   < > 4.3 4.5 4.4  CL 100   < > 104 105 102  CO2 27   < > 25 25 26   GLUCOSE 138*   < > 101* 100* 103*  BUN 17   < > 23 23 20   CREATININE 1.24   < > 1.05 1.07 1.13  CALCIUM 8.4*   < > 8.8* 8.7* 8.9  MG 2.4  --   --   --   --   PHOS 4.1  --   --   --   --    < > = values in this interval not displayed.   GFR: Estimated Creatinine Clearance: 61.8 mL/min (by C-G formula based on SCr of 1.13 mg/dL).  Liver Function Tests: Recent Labs  Lab 11/23/19 0412 11/24/19 0506 11/25/19 0844 11/26/19 0042  AST 60* 67* 57* 43*  ALT 44 58* 62* 52*  ALKPHOS 49 50 53 63  BILITOT 0.7 0.7 0.7 0.8  PROT 6.1* 5.9* 6.5 6.2*  ALBUMIN 2.7* 2.7* 2.9* 2.9*    Recent Labs  Lab 11/21/19 1516  AMMONIA 18    CBG: Recent Labs  Lab 11/27/19 0757 11/27/19 1200 11/27/19 1641  GLUCAP 84 117* 115*    Recent Results (from the past 240 hour(s))  Urine Culture     Status: None   Collection Time: 11/18/19 11:41 AM   Specimen: Urine  Result Value Ref Range Status   Source: URINE  Final   Status: FINAL  Final   Result: No Growth  Final  SARS Coronavirus 2 by RT PCR (hospital order, performed in [place] hospital lab) Nasopharyngeal Nasopharyngeal Swab     Status: Abnormal   Collection Time: 11/18/19  9:09 PM   Specimen: Nasopharyngeal Swab  Result Value Ref Range Status   SARS Coronavirus 2 POSITIVE (A) NEGATIVE Final    Comment: RESULT CALLED TO, READ BACK BY AND VERIFIED WITH: EMAIL SENT TO L,BERDIK @2236  11/18/19 EB Performed at [facility], [place] [address], [place], [place] [address]   Culture, blood (Routine X 2) w Reflex to ID Panel     Status: None   Collection  Time: 11/21/19  4:43 PM   Specimen: BLOOD  Result Value Ref Range Status   Specimen Description BLOOD SITE NOT SPECIFIED  Final   Special Requests   Final    BOTTLES DRAWN AEROBIC AND ANAEROBIC Blood Culture adequate volume   Culture   Final    NO GROWTH 5 DAYS Performed at [facility], [address] [place], [place] [address]    Report Status 11/26/2019 FINAL  Final  Culture, blood (Routine X 2) w Reflex to ID Panel     Status: None   Collection Time: 11/21/19  7:30 PM   Specimen: BLOOD  Result Value Ref Range Status   Specimen Description BLOOD BLOOD LEFT FOREARM  Final   Special Requests   Final    BOTTLES DRAWN AEROBIC ONLY Blood Culture results may not be optimal due to an inadequate volume of blood received in culture bottles   Culture   Final    NO GROWTH 5 DAYS Performed at [facility], [place] [address] [place], [place] [address]    Report Status 11/26/2019 FINAL  Final     Scheduled Meds: . vitamin C  500 mg Oral Daily  . citalopram  10 mg Oral Daily  . enoxaparin (LOVENOX) injection  40 mg Subcutaneous Q24H  . polyethylene glycol  17 g Oral Daily  . senna  2 tablet Oral QHS  . zinc sulfate  220 mg Oral Daily      LOS: 7 days   Cherene Altes, MD [facility] Office  [phone] Pager - Text Page per Amion  If 7PM-7AM, please contact night-coverage per Amion 11/28/2019, 8:41 AM

## 2019-11-28 NOTE — TOC Progression Note (Signed)
Transition of Care [facility]) - Progression Note    Patient Details  Name: [name] MRN: [id number] Date of Birth: December 14, 1945  Transition of [facility]) CM/SW [place], [place] Phone Number: 11/28/2019, 5:09 PM  Clinical Narrative:    [facility] declined patient. No other SNF offers currently.    Expected Discharge Plan: Skilled Nursing Facility Barriers to Discharge: SNF Pending bed offer, Insurance Authorization, Continued Medical Work up  Expected Discharge Plan and Services Expected Discharge Plan: [place] arrangements for the past 2 months: Single Family Home                                       Social Determinants of Health (SDOH) Interventions    Readmission Risk Interventions No flowsheet data found.

## 2019-11-29 ENCOUNTER — Inpatient Hospital Stay (HOSPITAL_COMMUNITY): Payer: BC Managed Care – PPO

## 2019-11-29 DIAGNOSIS — R0602 Shortness of breath: Secondary | ICD-10-CM

## 2019-11-29 DIAGNOSIS — F329 Major depressive disorder, single episode, unspecified: Secondary | ICD-10-CM

## 2019-11-29 DIAGNOSIS — U071 COVID-19: Principal | ICD-10-CM

## 2019-11-29 DIAGNOSIS — R410 Disorientation, unspecified: Secondary | ICD-10-CM

## 2019-11-29 DIAGNOSIS — R0902 Hypoxemia: Secondary | ICD-10-CM

## 2019-11-29 DIAGNOSIS — D696 Thrombocytopenia, unspecified: Secondary | ICD-10-CM

## 2019-11-29 LAB — CBC
HCT: 40.3 % (ref 39.0–52.0)
Hemoglobin: 12.8 g/dL — ABNORMAL LOW (ref 13.0–17.0)
MCH: 28.2 pg (ref 26.0–34.0)
MCHC: 31.8 g/dL (ref 30.0–36.0)
MCV: 88.8 fL (ref 80.0–100.0)
Platelets: 194 10*3/uL (ref 150–400)
RBC: 4.54 MIL/uL (ref 4.22–5.81)
RDW: 12.9 % (ref 11.5–15.5)
WBC: 16.6 10*3/uL — ABNORMAL HIGH (ref 4.0–10.5)
nRBC: 0 % (ref 0.0–0.2)

## 2019-11-29 LAB — COMPREHENSIVE METABOLIC PANEL
ALT: 33 U/L (ref 0–44)
AST: 23 U/L (ref 15–41)
Albumin: 2.7 g/dL — ABNORMAL LOW (ref 3.5–5.0)
Alkaline Phosphatase: 68 U/L (ref 38–126)
Anion gap: 9 (ref 5–15)
BUN: 18 mg/dL (ref 8–23)
CO2: 25 mmol/L (ref 22–32)
Calcium: 8.8 mg/dL — ABNORMAL LOW (ref 8.9–10.3)
Chloride: 105 mmol/L (ref 98–111)
Creatinine, Ser: 1.13 mg/dL (ref 0.61–1.24)
GFR calc Af Amer: >60 mL/min (ref >60)
GFR calc non Af Amer: >60 mL/min (ref >60)
Glucose, Bld: 95 mg/dL (ref 70–99)
Potassium: 4 mmol/L (ref 3.5–5.1)
Sodium: 139 mmol/L (ref 135–145)
Total Bilirubin: 1.7 mg/dL — ABNORMAL HIGH (ref 0.3–1.2)
Total Protein: 6.5 g/dL (ref 6.5–8.1)

## 2019-11-29 NOTE — Progress Notes (Signed)
PROGRESS NOTE    [name]  VQM:[id number] DOB: 1945-11-11 DOA: 11/21/2019 PCP: Biagio Borg, MD  Brief Narrative:  The patient is an unvaccinated 74 year old obese African-American male with a past medical history significant for hypertension, depression, chronic thrombocytopenia, CKD stage IIIa, as well as other comorbidities who tested positive for Covid on 11/18/2019 and presented to the ED on 11/21/2019 with generalized weakness, cough, confusion.  In the ED he was found to be suffering with acute hypoxia and was admitted for further evaluation.  Initial CT of the chest showed no evidence of PE but did show patchy peripheral groundglass opacities.  Head CT was done on 8/3 as well and showed no acute disease.  MRI of the brain was also done and showed no infarct, hemorrhage or mass.  He completed his remdesivir course on 11/25/2019 and currently steroids stopped on 11/27/19 (Completed 7 days).  Patient's bilirubin is still elevated so we will obtain a right upper quadrant ultrasound and WBC is elevated likely in the setting of steroid margination.  Patient currently feels okay and still awaiting SNF bed placement as there are no bed offers currently given his COVID-19 positive status.  Assessment & Plan:   Principal Problem:   Acute hypoxemic respiratory failure due to COVID-19 [facility]) Active Problems:   Essential hypertension   Depression   Thrombocytopenia (HCC)   CKD (chronic kidney disease), stage III   Acute metabolic encephalopathy   Pressure injury of skin   Covid pneumonia with acute hypoxic respiratory failure -Hypoxia now resolved - completed Remdesivir - steroid course discontinued after 7 days as patient clinically much improved and no longer hypoxic -Inflammatory Marker Trend Recent Labs    11/28/19 0312  DDIMER 1.15*    Lab Results  Component Value Date   SARSCOV2NAA POSITIVE (A) 11/18/2019   SARSCOV2NAA NOT DETECTED 12/21/2018   -SpO2: 97 % O2 Flow Rate (L/min):  2 L/min FiO2 (%): (!) 3 %; NO longer on Supplemental O2 via [place] -C/w albuterol 1 to 2 puffs elations every 4 hours as needed wheezing shortness of breath along with Robitussin-DM and continue with Supportive Care -Repeat CTA showed "Negative for acute pulmonary embolus. Progressed bilateral COVID-19 Pneumonia since 11/18/2019. No pleural effusion. Calcified coronary artery and Aortic Atherosclerosis"  Significant Acute increase in D-Dimer  -No evidence of DVT on venous duplex  -Repeat CTa without evidence of PE  -D-dimer has acutely declined  -Resumed prophylactic dose Lovenox only  Acute metabolic encephalopathy -Felt to be due to a combination of hypoxia and acute kidney injury  -Head CT showed "Age related cerebral atrophy, ventriculomegaly and advanced periventricular white matter disease. No acute intracranial findings or mass lesions." -MRI showed "No evidence of recent infarction, hemorrhage, or mass. Moderate chronic microvascular ischemic changes. Mild to moderate burden of chronic microhemorrhages, which may reflect chronic hypertension or amyloid angiopathy." -slowly improving  -stopped steroid early in hopes of assisting w/ recovery -Delirium Precautions    Acute kidney injury on CKD stage IIIa -Felt to be hemodynamically mediated -has improved with support  - Lab Results  Component Value Date   CREATININE 1.13 11/29/2019   CREATININE 1.13 11/28/2019   CREATININE 1.07 11/26/2019  -Avoid nephrotoxic medications, contrast dyes, hypotension and renally dose medications -Repeat CMP in a.m.  Elevated RPR titer -Titer mildly elevated at 1: 2 but Treponema pallidum antibody negative and ID suggest this is likely a false positive RPR titer with no further work-up required  Abnormal LFTs Hyperbilirubinemia Likely related to COVID-19 and treatment  of same -improving -Will check RUQ given that Bilirubin remains elevated Hepatic Function Latest Ref Rng & Units 11/29/2019  11/26/2019 11/25/2019  Total Protein 6.5 - 8.1 g/dL 6.5 6.2(L) 6.5  Albumin 3.5 - 5.0 g/dL 2.7(L) 2.9(L) 2.9(L)  AST 15 - 41 U/L 23 43(H) 57(H)  ALT 0 - 44 U/L 33 52(H) 62(H)  Alk Phosphatase 38 - 126 U/L 68 63 53  Total Bilirubin 0.3 - 1.2 mg/dL 1.7(H) 0.8 0.7  Bilirubin, Direct 0.0 - 0.3 mg/dL - - -  -Continue to Monitor and Trend Carefully -Repeat CMP in the AM  Chronic Thrombocytopenia -Stable and last platelet count is actually 194,000 today  HTN -Blood pressure reasonably controlled  Depression -Continue Celexa  Deconditioning/physical debility -Due to acute severe illness  -SNF recommended by physical therapy --medically stable for discharge and simply awaiting SNF bed   Obesity -Estimated body mass index is 30.35 kg/m as calculated from the following:   Height as of this encounter: '5\' 7"'$  (1.702 m).   Weight as of this encounter: 87.9 kg. -Weight Loss and Dietary Counseling given   DVT prophylaxis: Enoxaparin 40 mg sq q24h Code Status: FULL CODE Family Communication: No family present at bedside  Disposition Plan: SNF when bed available   Status is: Inpatient  Remains inpatient appropriate because:Unsafe d/c plan and Inpatient level of care appropriate due to severity of illness   Dispo:  Patient From: [place]  Planned Disposition: [place]  Expected discharge date: 11/26/19  Medically stable for discharge: Yes  Consultants:   None   Procedures: LE DUPLEX RIGHT:  - There is no evidence of deep vein thrombosis in the lower extremity.    - No cystic structure found in the popliteal fossa.    LEFT:  - There is no evidence of deep vein thrombosis in the lower extremity.    - No cystic structure found in the popliteal fossa.    *See table(s) above for measurements and observations.   Antimicrobials:  Anti-infectives (From admission, onward)   Start     Dose/Rate Route Frequency Ordered Stop   11/22/19 1000   remdesivir 100 mg in sodium chloride 0.9 % 100 mL IVPB       "Followed by" Linked Group Details   100 mg 200 mL/hr over 30 Minutes Intravenous Daily 11/21/19 1027 11/25/19 2035   11/22/19 1000  remdesivir 100 mg in sodium chloride 0.9 % 100 mL IVPB  Status:  Discontinued       "Followed by" Linked Group Details   100 mg 200 mL/hr over 30 Minutes Intravenous Daily 11/21/19 1353 11/21/19 1354   11/21/19 1400  remdesivir 200 mg in sodium chloride 0.9% 250 mL IVPB  Status:  Discontinued       "Followed by" Linked Group Details   200 mg 580 mL/hr over 30 Minutes Intravenous Once 11/21/19 1353 11/21/19 1354   11/21/19 1130  remdesivir 200 mg in sodium chloride 0.9% 250 mL IVPB       "Followed by" Linked Group Details   200 mg 580 mL/hr over 30 Minutes Intravenous Once 11/21/19 1027 11/21/19 1338     Subjective: Seen and examined at bedside and the entire time I was in the room he was on the phone talking with somebody and was not very interested in speaking with me.  He denied any complaints or concerns and states that he thinks he is doing okay.  No nausea or vomiting.  Denies any abdominal pain.  No other  concerns or complaints at this time.  Objective: Vitals:   11/28/19 2005 11/28/19 2252 11/29/19 0432 11/29/19 1414  BP: 129/88 (!) 140/91 139/88 100/71  Pulse: 79 73 66 69  Resp: '18 16 15 16  '$ Temp: 98.6 F (37 C) 98.6 F (37 C) 97.7 F (36.5 C) 98.3 F (36.8 C)  TempSrc: Oral Oral Axillary Oral  SpO2: 95% 95% 96% 97%  Weight:  87.9 kg    Height:        Intake/Output Summary (Last 24 hours) at 11/29/2019 1442 Last data filed at 11/29/2019 0425 Gross per 24 hour  Intake 180 ml  Output 150 ml  Net 30 ml   Filed Weights   11/25/19 0444 11/26/19 0417 11/28/19 2252  Weight: 91 kg 91.4 kg 87.9 kg   Examination: Physical Exam:  Constitutional: WN/WD obese AAM in NAD and appears calm and comfortable Eyes: Lids and conjunctivae normal, sclerae anicteric  ENMT: External Ears,  Nose appear normal. Grossly normal hearing.  Neck: Appears normal, supple, no cervical masses, normal ROM, no appreciable thyromegaly; no JVD Respiratory: Diminished to auscultation bilaterally with coarse breath sounds, no wheezing, rales, rhonchi or crackles. Normal respiratory effort and patient is not tachypenic. No accessory muscle use. Unlabored breathing  Cardiovascular: RRR, no murmurs / rubs / gallops. S1 and S2 auscultated. No carotid bruits.  Abdomen: Soft, non-tender, non-distended. Bowel sounds positive.  GU: Deferred. Musculoskeletal: No clubbing / cyanosis of digits/nails. Normal strength and muscle tone.  Skin: No rashes, lesions, ulcers on a limited skin evaluation. No induration; Warm and dry.  Neurologic: CN 2-12 grossly intact with no focal deficits. Romberg sign cerebellar reflexes not assessed.  Psychiatric: Normal judgment and insight. Alert and oriented x 3. Normal mood and appropriate affect.   Data Reviewed: I have personally reviewed following labs and imaging studies  CBC: Recent Labs  Lab 11/23/19 0412 11/24/19 0506 11/25/19 0844 11/26/19 0042 11/29/19 0401  WBC 5.8 8.5 7.5 6.5 16.6*  NEUTROABS 4.8 7.4 6.1 5.1  --   HGB 13.6 12.2* 13.2 13.1 12.8*  HCT 41.6 38.1* 41.8 40.2 40.3  MCV 89.1 88.0 88.7 88.9 88.8  PLT 144* 149* 192 141* 443   Basic Metabolic Panel: Recent Labs  Lab 11/24/19 0506 11/25/19 0844 11/26/19 0042 11/28/19 0312 11/29/19 0401  NA 138 142 141 137 139  K 4.1 4.3 4.5 4.4 4.0  CL 103 104 105 102 105  CO2 '27 25 25 26 25  '$ GLUCOSE 137* 101* 100* 103* 95  BUN 26* '23 23 20 18  '$ CREATININE 1.11 1.05 1.07 1.13 1.13  CALCIUM 8.3* 8.8* 8.7* 8.9 8.8*   GFR: Estimated Creatinine Clearance: 60.7 mL/min (by C-G formula based on SCr of 1.13 mg/dL). Liver Function Tests: Recent Labs  Lab 11/23/19 0412 11/24/19 0506 11/25/19 0844 11/26/19 0042 11/29/19 0401  AST 60* 67* 57* 43* 23  ALT 44 58* 62* 52* 33  ALKPHOS 49 50 53 63 68    BILITOT 0.7 0.7 0.7 0.8 1.7*  PROT 6.1* 5.9* 6.5 6.2* 6.5  ALBUMIN 2.7* 2.7* 2.9* 2.9* 2.7*   No results for input(s): LIPASE, AMYLASE in the last 168 hours. No results for input(s): AMMONIA in the last 168 hours. Coagulation Profile: No results for input(s): INR, PROTIME in the last 168 hours. Cardiac Enzymes: No results for input(s): CKTOTAL, CKMB, CKMBINDEX, TROPONINI in the last 168 hours. BNP (last 3 results) No results for input(s): PROBNP in the last 8760 hours. HbA1C: No results for input(s): HGBA1C in the last  72 hours. CBG: Recent Labs  Lab 11/27/19 0757 11/27/19 1200 11/27/19 1641  GLUCAP 84 117* 115*   Lipid Profile: No results for input(s): CHOL, HDL, LDLCALC, TRIG, CHOLHDL, LDLDIRECT in the last 72 hours. Thyroid Function Tests: No results for input(s): TSH, T4TOTAL, FREET4, T3FREE, THYROIDAB in the last 72 hours. Anemia Panel: No results for input(s): VITAMINB12, FOLATE, FERRITIN, TIBC, IRON, RETICCTPCT in the last 72 hours. Sepsis Labs: No results for input(s): PROCALCITON, LATICACIDVEN in the last 168 hours.  Recent Results (from the past 240 hour(s))  Culture, blood (Routine X 2) w Reflex to ID Panel     Status: None   Collection Time: 11/21/19  4:43 PM   Specimen: BLOOD  Result Value Ref Range Status   Specimen Description BLOOD SITE NOT SPECIFIED  Final   Special Requests   Final    BOTTLES DRAWN AEROBIC AND ANAEROBIC Blood Culture adequate volume   Culture   Final    NO GROWTH 5 DAYS Performed at [facility], [address] [place], [place] [address]    Report Status 11/26/2019 FINAL  Final  Culture, blood (Routine X 2) w Reflex to ID Panel     Status: None   Collection Time: 11/21/19  7:30 PM   Specimen: BLOOD  Result Value Ref Range Status   Specimen Description BLOOD BLOOD LEFT FOREARM  Final   Special Requests   Final    BOTTLES DRAWN AEROBIC ONLY Blood Culture results may not be optimal due to an inadequate volume of blood received  in culture bottles   Culture   Final    NO GROWTH 5 DAYS Performed at [facility], [place] [address] [place], [place] [address]    Report Status 11/26/2019 FINAL  Final     RN Pressure Injury Documentation: Pressure Injury 11/26/19 Sacrum Mid Stage 2 -  Partial thickness loss of dermis presenting as a shallow open injury with a red, pink wound bed without slough. (Active)  11/26/19 2056  Location: Sacrum  Location Orientation: Mid  Staging: Stage 2 -  Partial thickness loss of dermis presenting as a shallow open injury with a red, pink wound bed without slough.  Wound Description (Comments):   Present on Admission:    Estimated body mass index is 30.35 kg/m as calculated from the following:   Height as of this encounter: '5\' 7"'$  (1.702 m).   Weight as of this encounter: 87.9 kg.  Malnutrition Type:      Malnutrition Characteristics:      Nutrition Interventions:     Radiology Studies: CT ANGIO CHEST PE W OR WO CONTRAST  Result Date: 11/28/2019 CLINICAL DATA:  74 year old male with positive COVID-19. Abnormal D-dimer. EXAM: CT ANGIOGRAPHY CHEST WITH CONTRAST TECHNIQUE: Multidetector CT imaging of the chest was performed using the standard protocol during bolus administration of intravenous contrast. Multiplanar CT image reconstructions and MIPs were obtained to evaluate the vascular anatomy. CONTRAST:  176m OMNIPAQUE IOHEXOL 350 MG/ML SOLN COMPARISON:  CTA chest 11/18/2019. FINDINGS: Cardiovascular: Excellent contrast bolus timing in the pulmonary arterial tree. Trace gas in the main PA, appears inconsequential. Mild respiratory motion at both lung bases. No focal filling defect identified in the pulmonary arteries to suggest acute pulmonary embolism. Little contrast in the aorta today. Calcified aortic atherosclerosis. Calcified coronary artery atherosclerosis again evident. No cardiomegaly or pericardial effusion. Mediastinum/Nodes: Negative, no lymphadenopathy.  Lungs/Pleura: Similar lung volumes to the prior CTA. Major airways remain patent. Increased bilateral perihilar and scattered peripheral pulmonary  ground-glass opacity, now moderate in the bilateral lower lobes and left upper lobe. No superimposed pneumothorax or pleural effusion. Upper Abdomen: Negative visible liver, gallbladder, spleen, pancreas, adrenal glands, kidneys, and bowel in the upper abdomen. Musculoskeletal: Degenerative changes in the spine. No acute osseous abnormality identified. Review of the MIP images confirms the above findings. IMPRESSION: 1. Negative for acute pulmonary embolus. 2. Progressed bilateral COVID-19 Pneumonia since 11/18/2019. No pleural effusion. 3. Calcified coronary artery and Aortic Atherosclerosis (ICD10-I70.0). Electronically Signed   By: Genevie Ann M.D.   On: 11/28/2019 00:35   VAS [place] LOWER EXTREMITY VENOUS (DVT)  Result Date: 11/28/2019  Lower Venous DVT Study Indications: Elevated Ddimer.  Risk Factors: COVID 19 positive. Comparison Study: No prior studies. Performing Technologist: Oliver Hum RVT  Examination Guidelines: A complete evaluation includes B-mode imaging, spectral Doppler, color Doppler, and power Doppler as needed of all accessible portions of each vessel. Bilateral testing is considered an integral part of a complete examination. Limited examinations for reoccurring indications may be performed as noted. The reflux portion of the exam is performed with the patient in reverse Trendelenburg.  +---------+---------------+---------+-----------+----------+--------------+ RIGHT    CompressibilityPhasicitySpontaneityPropertiesThrombus Aging +---------+---------------+---------+-----------+----------+--------------+ CFV      Full           Yes      Yes                                 +---------+---------------+---------+-----------+----------+--------------+ SFJ      Full                                                         +---------+---------------+---------+-----------+----------+--------------+ FV Prox  Full                                                        +---------+---------------+---------+-----------+----------+--------------+ FV Mid   Full                                                        +---------+---------------+---------+-----------+----------+--------------+ FV DistalFull                                                        +---------+---------------+---------+-----------+----------+--------------+ PFV      Full                                                        +---------+---------------+---------+-----------+----------+--------------+ POP      Full           Yes      Yes                                 +---------+---------------+---------+-----------+----------+--------------+  PTV      Full                                                        +---------+---------------+---------+-----------+----------+--------------+ PERO     Full                                                        +---------+---------------+---------+-----------+----------+--------------+   +---------+---------------+---------+-----------+----------+--------------+ LEFT     CompressibilityPhasicitySpontaneityPropertiesThrombus Aging +---------+---------------+---------+-----------+----------+--------------+ CFV      Full           Yes      Yes                                 +---------+---------------+---------+-----------+----------+--------------+ SFJ      Full                                                        +---------+---------------+---------+-----------+----------+--------------+ FV Prox  Full                                                        +---------+---------------+---------+-----------+----------+--------------+ FV Mid   Full                                                         +---------+---------------+---------+-----------+----------+--------------+ FV DistalFull                                                        +---------+---------------+---------+-----------+----------+--------------+ PFV      Full                                                        +---------+---------------+---------+-----------+----------+--------------+ POP      Full           Yes      Yes                                 +---------+---------------+---------+-----------+----------+--------------+ PTV      Full                                                        +---------+---------------+---------+-----------+----------+--------------+  PERO     Full                                                        +---------+---------------+---------+-----------+----------+--------------+     Summary: RIGHT: - There is no evidence of deep vein thrombosis in the lower extremity.  - No cystic structure found in the popliteal fossa.  LEFT: - There is no evidence of deep vein thrombosis in the lower extremity.  - No cystic structure found in the popliteal fossa.  *See table(s) above for measurements and observations. Electronically signed by Servando Snare MD on 11/28/2019 at 8:14:57 AM.    Final    Scheduled Meds: . citalopram  10 mg Oral Daily  . enoxaparin (LOVENOX) injection  40 mg Subcutaneous Q24H  . polyethylene glycol  17 g Oral Daily  . senna  2 tablet Oral QHS   Continuous Infusions:   LOS: 8 days   Kerney Elbe, DO [facility] PAGER is on AMION  If 7PM-7AM, please contact night-coverage [email or web address]

## 2019-11-30 ENCOUNTER — Encounter: Payer: Self-pay | Admitting: Internal Medicine

## 2019-11-30 ENCOUNTER — Inpatient Hospital Stay (HOSPITAL_COMMUNITY): Payer: BC Managed Care – PPO

## 2019-11-30 ENCOUNTER — Encounter (HOSPITAL_COMMUNITY): Payer: Self-pay | Admitting: Internal Medicine

## 2019-11-30 LAB — COMPREHENSIVE METABOLIC PANEL
ALT: 30 U/L (ref 0–44)
ALT: 31 U/L (ref 0–44)
AST: 21 U/L (ref 15–41)
AST: 21 U/L (ref 15–41)
Albumin: 2.5 g/dL — ABNORMAL LOW (ref 3.5–5.0)
Albumin: 2.7 g/dL — ABNORMAL LOW (ref 3.5–5.0)
Alkaline Phosphatase: 68 U/L (ref 38–126)
Alkaline Phosphatase: 79 U/L (ref 38–126)
Anion gap: 9 (ref 5–15)
Anion gap: 10 (ref 5–15)
BUN: 17 mg/dL (ref 8–23)
BUN: 22 mg/dL (ref 8–23)
CO2: 25 mmol/L (ref 22–32)
CO2: 28 mmol/L (ref 22–32)
Calcium: 8.7 mg/dL — ABNORMAL LOW (ref 8.9–10.3)
Calcium: 9 mg/dL (ref 8.9–10.3)
Chloride: 103 mmol/L (ref 98–111)
Chloride: 100 mmol/L (ref 98–111)
Creatinine, Ser: 1.02 mg/dL (ref 0.61–1.24)
Creatinine, Ser: 1.24 mg/dL (ref 0.61–1.24)
GFR calc Af Amer: >60 mL/min (ref >60)
GFR calc Af Amer: >60 mL/min (ref >60)
GFR calc non Af Amer: >60 mL/min (ref >60)
GFR calc non Af Amer: 57 mL/min — ABNORMAL LOW (ref >60)
Glucose, Bld: 119 mg/dL — ABNORMAL HIGH (ref 70–99)
Glucose, Bld: 126 mg/dL — ABNORMAL HIGH (ref 70–99)
Potassium: 3.8 mmol/L (ref 3.5–5.1)
Potassium: 4.5 mmol/L (ref 3.5–5.1)
Sodium: 137 mmol/L (ref 135–145)
Sodium: 138 mmol/L (ref 135–145)
Total Bilirubin: 1 mg/dL (ref 0.3–1.2)
Total Bilirubin: 0.8 mg/dL (ref 0.3–1.2)
Total Protein: 6.4 g/dL — ABNORMAL LOW (ref 6.5–8.1)
Total Protein: 7 g/dL (ref 6.5–8.1)

## 2019-11-30 LAB — CBC WITH DIFFERENTIAL/PLATELET
Abs Immature Granulocytes: 0.23 10*3/uL — ABNORMAL HIGH (ref 0.00–0.07)
Abs Immature Granulocytes: 0.15 10*3/uL — ABNORMAL HIGH (ref 0.00–0.07)
Basophils Absolute: 0 10*3/uL (ref 0.0–0.1)
Basophils Absolute: 0 10*3/uL (ref 0.0–0.1)
Basophils Relative: 0 %
Basophils Relative: 0 %
Eosinophils Absolute: 0 10*3/uL (ref 0.0–0.5)
Eosinophils Absolute: 0 10*3/uL (ref 0.0–0.5)
Eosinophils Relative: 0 %
Eosinophils Relative: 0 %
HCT: 39.9 % (ref 39.0–52.0)
HCT: 39.8 % (ref 39.0–52.0)
Hemoglobin: 13.1 g/dL (ref 13.0–17.0)
Hemoglobin: 12.9 g/dL — ABNORMAL LOW (ref 13.0–17.0)
Immature Granulocytes: 2 %
Immature Granulocytes: 1 %
Lymphocytes Relative: 5 %
Lymphocytes Relative: 4 %
Lymphs Abs: 0.7 10*3/uL (ref 0.7–4.0)
Lymphs Abs: 0.4 10*3/uL — ABNORMAL LOW (ref 0.7–4.0)
MCH: 29.2 pg (ref 26.0–34.0)
MCH: 28.5 pg (ref 26.0–34.0)
MCHC: 32.8 g/dL (ref 30.0–36.0)
MCHC: 32.4 g/dL (ref 30.0–36.0)
MCV: 88.9 fL (ref 80.0–100.0)
MCV: 87.9 fL (ref 80.0–100.0)
Monocytes Absolute: 0.7 10*3/uL (ref 0.1–1.0)
Monocytes Absolute: 0.4 10*3/uL (ref 0.1–1.0)
Monocytes Relative: 5 %
Monocytes Relative: 3 %
Neutro Abs: 11.6 10*3/uL — ABNORMAL HIGH (ref 1.7–7.7)
Neutro Abs: 10.6 10*3/uL — ABNORMAL HIGH (ref 1.7–7.7)
Neutrophils Relative %: 88 %
Neutrophils Relative %: 92 %
Platelets: 173 10*3/uL (ref 150–400)
Platelets: 199 10*3/uL (ref 150–400)
RBC: 4.49 MIL/uL (ref 4.22–5.81)
RBC: 4.53 MIL/uL (ref 4.22–5.81)
RDW: 13.2 % (ref 11.5–15.5)
RDW: 13.2 % (ref 11.5–15.5)
WBC: 13.2 10*3/uL — ABNORMAL HIGH (ref 4.0–10.5)
WBC: 11.6 10*3/uL — ABNORMAL HIGH (ref 4.0–10.5)
nRBC: 0 % (ref 0.0–0.2)
nRBC: 0 % (ref 0.0–0.2)

## 2019-11-30 LAB — C-REACTIVE PROTEIN
CRP: 11.5 mg/dL — ABNORMAL HIGH (ref <1.0)
CRP: 9 mg/dL — ABNORMAL HIGH (ref <1.0)

## 2019-11-30 LAB — D-DIMER, QUANTITATIVE
D-Dimer, Quant: 0.78 ug/mL-FEU — ABNORMAL HIGH (ref 0.00–0.50)
D-Dimer, Quant: 1.18 ug/mL-FEU — ABNORMAL HIGH (ref 0.00–0.50)

## 2019-11-30 LAB — LACTATE DEHYDROGENASE
LDH: 245 U/L — ABNORMAL HIGH (ref 98–192)
LDH: 230 U/L — ABNORMAL HIGH (ref 98–192)

## 2019-11-30 LAB — SEDIMENTATION RATE
Sed Rate: 92 mm/hr — ABNORMAL HIGH (ref 0–16)
Sed Rate: 87 mm/hr — ABNORMAL HIGH (ref 0–16)

## 2019-11-30 LAB — MAGNESIUM
Magnesium: 2.2 mg/dL (ref 1.7–2.4)
Magnesium: 2.3 mg/dL (ref 1.7–2.4)

## 2019-11-30 LAB — PHOSPHORUS
Phosphorus: 3 mg/dL (ref 2.5–4.6)
Phosphorus: 3.9 mg/dL (ref 2.5–4.6)

## 2019-11-30 LAB — FIBRINOGEN
Fibrinogen: 736 mg/dL — ABNORMAL HIGH (ref 210–475)
Fibrinogen: >800 mg/dL — ABNORMAL HIGH (ref 210–475)

## 2019-11-30 MED ORDER — ASCORBIC ACID 500 MG PO TABS
500.0000 mg | ORAL_TABLET | Freq: Every day | ORAL | Status: DC
  Administered 2019-11-30 – 2019-12-09 (×10): 500 mg via ORAL
  Filled 2019-11-30 (×10): qty 1

## 2019-11-30 MED ORDER — DEXAMETHASONE SODIUM PHOSPHATE 10 MG/ML IJ SOLN
6.0000 mg | Freq: Every day | INTRAMUSCULAR | Status: AC
  Administered 2019-11-30 – 2019-12-02 (×3): 6 mg via INTRAVENOUS
  Filled 2019-11-30 (×3): qty 0.6

## 2019-11-30 MED ORDER — IPRATROPIUM-ALBUTEROL 20-100 MCG/ACT IN AERS
1.0000 | INHALATION_SPRAY | Freq: Four times a day (QID) | RESPIRATORY_TRACT | Status: DC
  Administered 2019-11-30 – 2019-12-02 (×6): 1 via RESPIRATORY_TRACT
  Filled 2019-11-30: qty 4

## 2019-11-30 MED ORDER — DEXAMETHASONE SODIUM PHOSPHATE 10 MG/ML IJ SOLN: 6.0000 mg | Freq: Every day | INTRAMUSCULAR | Status: DC

## 2019-11-30 MED ORDER — ZINC SULFATE 220 (50 ZN) MG PO CAPS
220.0000 mg | ORAL_CAPSULE | Freq: Every day | ORAL | Status: DC
  Administered 2019-11-30 – 2019-12-09 (×10): 220 mg via ORAL
  Filled 2019-11-30 (×10): qty 1

## 2019-11-30 NOTE — Plan of Care (Signed)
  Problem: Acute Rehab PT Goals(only PT should resolve) Goal: Pt will Roll Supine to Side Outcome: Completed/Met Goal: Pt Will Go Sit To Supine/Side Outcome: Completed/Met Goal: Pt Will Transfer Bed To Chair/Chair To Bed Outcome: Completed/Met

## 2019-11-30 NOTE — TOC Progression Note (Addendum)
Transition of Care [facility]) - Progression Note    Patient Details  Name: [name] MRN: [id number] Date of Birth: 07-22-1945  Transition of Care [facility]) CM/SW [place], [place] Phone Number: [phone] 11/30/2019, 1:42 PM  Clinical Narrative:    CSW attempted to call [facility] to ask if they could review referral within the hub. Patient has no other bed offers.  3:00pm- CSW spoke with Tye [place] at [facility] and she informed CSW that they do not currently have any COVID + beds.   TOC team will continue to assist with discharge planning needs.   Expected Discharge Plan: Skilled Nursing Facility Barriers to Discharge: SNF Pending bed offer, Insurance Authorization, Continued Medical Work up  Expected Discharge Plan and Services Expected Discharge Plan: [place] arrangements for the past 2 months: Single Family Home                                       Social Determinants of Health (SDOH) Interventions    Readmission Risk Interventions No flowsheet data found.

## 2019-11-30 NOTE — Plan of Care (Signed)

## 2019-11-30 NOTE — Progress Notes (Signed)
PROGRESS NOTE    [name]  QHU:[id number] DOB: 1946/04/14 DOA: 11/21/2019 PCP: Biagio Borg, MD  Brief Narrative:  The patient is an unvaccinated 74 year old obese African-American male with a past medical history significant for hypertension, depression, chronic thrombocytopenia, CKD stage IIIa, as well as other comorbidities who tested positive for Covid on 11/18/2019 and presented to the ED on 11/21/2019 with generalized weakness, cough, confusion.  In the ED he was found to be suffering with acute hypoxia and was admitted for further evaluation.  Initial CT of the chest showed no evidence of PE but did show patchy peripheral groundglass opacities.  Head CT was done on 8/3 as well and showed no acute disease.  MRI of the brain was also done and showed no infarct, hemorrhage or mass.  He completed his remdesivir course on 11/25/2019 and currently steroids stopped on 11/27/19 (Completed 7 days).  Patient's bilirubin is still elevated so we will obtain a right upper quadrant ultrasound and WBC is elevated likely in the setting of steroid margination.  Patient currently feels okay and still awaiting SNF bed placement as there are no bed offers currently given his COVID-19 positive status.  Assessment & Plan:   Principal Problem:   Acute hypoxemic respiratory failure due to COVID-19 [facility]) Active Problems:   Essential hypertension   Depression   Thrombocytopenia (HCC)   CKD (chronic kidney disease), stage III   Acute metabolic encephalopathy   Pressure injury of skin   Confusion   COVID-19 virus infection   Hyperbilirubinemia   Hypoxia   Shortness of breath   Covid pneumonia with acute hypoxic respiratory failure -Hypoxia improved - completed Remdesivir - steroid course discontinued after 7 days as patient clinically much improved and no longer hypoxic but because CRP trending back up restarted Dexamethasone 6 mg Daily x3 Days  -Inflammatory Marker Trend Recent Labs    11/28/19 0312  11/30/19 0230  DDIMER 1.15* 0.78*  LDH  --  245*  CRP  --  11.5*    Lab Results  Component Value Date   SARSCOV2NAA POSITIVE (A) 11/18/2019   SARSCOV2NAA NOT DETECTED 12/21/2018   -SpO2: 97 % O2 Flow Rate (L/min): 2 L/min FiO2 (%): (!) 3 %; NO longer on Supplemental O2 via [place] -C/w albuterol 1 to 2 puffs elations every 4 hours as needed wheezing shortness of breath along with Robitussin-DM and continue with Supportive Care -Repeat CTA showed "Negative for acute pulmonary embolus. Progressed bilateral COVID-19 Pneumonia since 11/18/2019. No pleural effusion. Calcified coronary artery and Aortic Atherosclerosis" -CXR showed "Cardiac shadows within normal limits. Bibasilar opacities are noted consistent with the given clinical history of COVID-19 positivity and stable from the recent CT. No new focal abnormality is noted."  Significant Acute increase in D-Dimer  -No evidence of DVT on venous duplex  -Repeat CTa without evidence of PE  -D-dimer has acutely declined  and D-dimers trending down further and went from 1.15 is now 0.78 -Resumed prophylactic dose Lovenox only  Acute metabolic encephalopathy -Felt to be due to a combination of hypoxia and acute kidney injury  -Head CT showed "Age related cerebral atrophy, ventriculomegaly and advanced periventricular white matter disease. No acute intracranial findings or mass lesions." -MRI showed "No evidence of recent infarction, hemorrhage, or mass. Moderate chronic microvascular ischemic changes. Mild to moderate burden of chronic microhemorrhages, which may reflect chronic hypertension or amyloid angiopathy." -slowly improving  -stopped steroid early in hopes of assisting w/ recovery -Continue with delirium Precautions    Acute kidney  injury on CKD stage IIIa -Felt to be hemodynamically mediated -has improved with support  - Lab Results  Component Value Date   CREATININE 1.02 11/30/2019   CREATININE 1.13 11/29/2019   CREATININE  1.13 11/28/2019  -Avoid nephrotoxic medications, contrast dyes, hypotension and renally dose medications -Repeat CMP in a.m.  Elevated RPR titer -Titer mildly elevated at 1: 2 but Treponema pallidum antibody negative and ID suggest this is likely a false positive RPR titer with no further work-up required  Abnormal LFTs Hyperbilirubinemia Likely related to COVID-19 and treatment of same -improving -Will check RUQ given that Bilirubin remains elevated; right upper quadrant ultrasound showed a 2 mm gallbladder polyp but no other acute findings. Hepatic Function Latest Ref Rng & Units 11/30/2019 11/29/2019 11/26/2019  Total Protein 6.5 - 8.1 g/dL 6.4(L) 6.5 6.2(L)  Albumin 3.5 - 5.0 g/dL 2.5(L) 2.7(L) 2.9(L)  AST 15 - 41 U/L 21 23 43(H)  ALT 0 - 44 U/L 30 33 52(H)  Alk Phosphatase 38 - 126 U/L 68 68 63  Total Bilirubin 0.3 - 1.2 mg/dL 1.0 1.7(H) 0.8  Bilirubin, Direct 0.0 - 0.3 mg/dL - - -  -Continue to Monitor and Trend Carefully -Repeat CMP in the AM  Chronic Thrombocytopenia -Stable and last platelet count is actually 173,000 today  HTN -Blood pressure reasonably controlled -BP is now 144/90  Depression -Continue Celexa  Deconditioning/physical debility -Due to acute severe illness  -SNF recommended by physical therapy --medically stable for discharge and simply awaiting SNF bed   Obesity -Estimated body mass index is 30.35 kg/m as calculated from the following:   Height as of this encounter: _0  (1.702 m).   Weight as of this encounter: 87.9 kg. -Weight Loss and Dietary Counseling given   DVT prophylaxis: Enoxaparin 40 mg sq q24h Code Status: FULL CODE Family Communication: No family present at bedside  Disposition Plan: SNF when bed available   Status is: Inpatient  Remains inpatient appropriate because:Unsafe d/c plan and Inpatient level of care appropriate due to severity of illness   Dispo:  Patient From: [place]  Planned Disposition:  [place]  Expected discharge date: 11/26/19  Medically stable for discharge: Yes  Consultants:   None   Procedures: LE DUPLEX RIGHT:  - There is no evidence of deep vein thrombosis in the lower extremity.    - No cystic structure found in the popliteal fossa.    LEFT:  - There is no evidence of deep vein thrombosis in the lower extremity.    - No cystic structure found in the popliteal fossa.    *See table(s) above for measurements and observations.   Antimicrobials:  Anti-infectives (From admission, onward)   Start     Dose/Rate Route Frequency Ordered Stop   11/22/19 1000  remdesivir 100 mg in sodium chloride 0.9 % 100 mL IVPB       "Followed by" Linked Group Details   100 mg 200 mL/hr over 30 Minutes Intravenous Daily 11/21/19 1027 11/25/19 2035   11/22/19 1000  remdesivir 100 mg in sodium chloride 0.9 % 100 mL IVPB  Status:  Discontinued       "Followed by" Linked Group Details   100 mg 200 mL/hr over 30 Minutes Intravenous Daily 11/21/19 1353 11/21/19 1354   11/21/19 1400  remdesivir 200 mg in sodium chloride 0.9% 250 mL IVPB  Status:  Discontinued       "Followed by" Linked Group Details   200 mg 580 mL/hr over 30 Minutes  Intravenous Once 11/21/19 1353 11/21/19 1354   11/21/19 1130  remdesivir 200 mg in sodium chloride 0.9% 250 mL IVPB       "Followed by" Linked Group Details   200 mg 580 mL/hr over 30 Minutes Intravenous Once 11/21/19 1027 11/21/19 1338     Subjective: Seen and examined at bedside and he sitting in the chair at bedside with no issues.  Denies any chest pain, lightheadedness or dizziness.  States that he has been doing his incentive spirometer.  States he also has a little bit of cough.  No nausea or vomiting.  No other concerns or complaints at this time.  Objective: Vitals:   11/29/19 1414 11/29/19 2125 11/30/19 0620 11/30/19 1300  BP: 100/71 130/89 132/90 (!) 144/90  Pulse: 69 71 66 74  Resp: _0 Temp: 98.3 F  (36.8 C) 98.1 F (36.7 C) 98.2 F (36.8 C) 98.1 F (36.7 C)  TempSrc: Oral Axillary Axillary Oral  SpO2: 97% 95% 95% 97%  Weight:      Height:        Intake/Output Summary (Last 24 hours) at 11/30/2019 1551 Last data filed at 11/30/2019 0093 Gross per 24 hour  Intake 650 ml  Output 800 ml  Net -150 ml   Filed Weights   11/25/19 0444 11/26/19 0417 11/28/19 2252  Weight: 91 kg 91.4 kg 87.9 kg   Examination: Physical Exam:  Constitutional: WN/WD obese African-American male currently no acute distress appears calm and comfortable sitting in chair bedside. Eyes: Lids and conjunctivae normal, sclerae anicteric  ENMT: External Ears, Nose appear normal. Grossly normal hearing.  Neck: Appears normal, supple, no cervical masses, normal ROM, no appreciable thyromegaly; no JVD Respiratory: Diminished to auscultation bilaterally with coarse breath sounds, no wheezing, rales, rhonchi or crackles. Normal respiratory effort and patient is not tachypenic. No accessory muscle use.  Unlabored breathing Cardiovascular: RRR, no murmurs / rubs / gallops. S1 and S2 auscultated. No extremity edema.  Abdomen: Soft, non-tender, slightly distended secondary body habitus. Bowel sounds positive.  GU: Deferred. Musculoskeletal: No clubbing / cyanosis of digits/nails. No joint deformity upper and lower extremities.  Skin: No rashes, lesions, ulcers on a limited skin evaluation. No induration; Warm and dry.  Neurologic: CN 2-12 grossly intact with no focal deficits. Romberg sign and cerebellar reflexes not assessed.  Psychiatric: Normal judgment and insight. Alert and oriented x 3. Normal mood and appropriate affect.   Data Reviewed: I have personally reviewed following labs and imaging studies  CBC: Recent Labs  Lab 11/24/19 0506 11/25/19 0844 11/26/19 0042 11/29/19 0401 11/30/19 0230  WBC 8.5 7.5 6.5 16.6* 13.2*  NEUTROABS 7.4 6.1 5.1  --  11.6*  HGB 12.2* 13.2 13.1 12.8* 13.1  HCT 38.1* 41.8 40.2  40.3 39.9  MCV 88.0 88.7 88.9 88.8 88.9  PLT 149* 192 141* 194 818   Basic Metabolic Panel: Recent Labs  Lab 11/25/19 0844 11/26/19 0042 11/28/19 0312 11/29/19 0401 11/30/19 0230  NA 142 141 137 139 137  K 4.3 4.5 4.4 4.0 3.8  CL 104 105 102 105 103  CO2 _1 GLUCOSE 101* 100* 103* 95 119*  BUN _2 CREATININE 1.05 1.07 1.13 1.13 1.02  CALCIUM 8.8* 8.7* 8.9 8.8* 8.7*  MG  --   --   --   --  2.2  PHOS  --   --   --   --  3.0   GFR: Estimated Creatinine  Clearance: 67.2 mL/min (by C-G formula based on SCr of 1.02 mg/dL). Liver Function Tests: Recent Labs  Lab 11/24/19 0506 11/25/19 0844 11/26/19 0042 11/29/19 0401 11/30/19 0230  AST 67* 57* 43* 23 21  ALT 58* 62* 52* 33 30  ALKPHOS 50 53 63 68 68  BILITOT 0.7 0.7 0.8 1.7* 1.0  PROT 5.9* 6.5 6.2* 6.5 6.4*  ALBUMIN 2.7* 2.9* 2.9* 2.7* 2.5*   No results for input(s): LIPASE, AMYLASE in the last 168 hours. No results for input(s): AMMONIA in the last 168 hours. Coagulation Profile: No results for input(s): INR, PROTIME in the last 168 hours. Cardiac Enzymes: No results for input(s): CKTOTAL, CKMB, CKMBINDEX, TROPONINI in the last 168 hours. BNP (last 3 results) No results for input(s): PROBNP in the last 8760 hours. HbA1C: No results for input(s): HGBA1C in the last 72 hours. CBG: Recent Labs  Lab 11/27/19 0757 11/27/19 1200 11/27/19 1641  GLUCAP 84 117* 115*   Lipid Profile: No results for input(s): CHOL, HDL, LDLCALC, TRIG, CHOLHDL, LDLDIRECT in the last 72 hours. Thyroid Function Tests: No results for input(s): TSH, T4TOTAL, FREET4, T3FREE, THYROIDAB in the last 72 hours. Anemia Panel: No results for input(s): VITAMINB12, FOLATE, FERRITIN, TIBC, IRON, RETICCTPCT in the last 72 hours. Sepsis Labs: No results for input(s): PROCALCITON, LATICACIDVEN in the last 168 hours.  Recent Results (from the past 240 hour(s))  Culture, blood (Routine X 2) w Reflex to ID Panel     Status: None    Collection Time: 11/21/19  4:43 PM   Specimen: BLOOD  Result Value Ref Range Status   Specimen Description BLOOD SITE NOT SPECIFIED  Final   Special Requests   Final    BOTTLES DRAWN AEROBIC AND ANAEROBIC Blood Culture adequate volume   Culture   Final    NO GROWTH 5 DAYS Performed at [facility], [address] [place], [place] [address]    Report Status 11/26/2019 FINAL  Final  Culture, blood (Routine X 2) w Reflex to ID Panel     Status: None   Collection Time: 11/21/19  7:30 PM   Specimen: BLOOD  Result Value Ref Range Status   Specimen Description BLOOD BLOOD LEFT FOREARM  Final   Special Requests   Final    BOTTLES DRAWN AEROBIC ONLY Blood Culture results may not be optimal due to an inadequate volume of blood received in culture bottles   Culture   Final    NO GROWTH 5 DAYS Performed at [facility], [place] [address] [place],  [address]    Report Status 11/26/2019 FINAL  Final     RN Pressure Injury Documentation: Pressure Injury 11/26/19 Sacrum Mid Stage 2 -  Partial thickness loss of dermis presenting as a shallow open injury with a red, pink wound bed without slough. (Active)  11/26/19 2056  Location: Sacrum  Location Orientation: Mid  Staging: Stage 2 -  Partial thickness loss of dermis presenting as a shallow open injury with a red, pink wound bed without slough.  Wound Description (Comments):   Present on Admission:    Estimated body mass index is 30.35 kg/m as calculated from the following:   Height as of this encounter: _0  (1.702 m).   Weight as of this encounter: 87.9 kg.  Malnutrition Type:      Malnutrition Characteristics:      Nutrition Interventions:     Radiology Studies: DG CHEST PORT 1 VIEW  Result Date: 11/30/2019 CLINICAL DATA:  Shortness of breath, COVID-19 positivity EXAM: PORTABLE CHEST 1 VIEW COMPARISON:  11/28/2019 CT FINDINGS: Cardiac shadows within normal limits. Bibasilar opacities are noted consistent  with the given clinical history of COVID-19 positivity and stable from the recent CT. No new focal abnormality is noted. IMPRESSION: Stable bibasilar opacities consistent with the given clinical history. Electronically Signed   By: Inez Catalina M.D.   On: 11/30/2019 07:20   US Abdomen Limited RUQ  Result Date: 11/29/2019 CLINICAL DATA:  Hyperbilirubinemia. EXAM: ULTRASOUND ABDOMEN LIMITED RIGHT UPPER QUADRANT COMPARISON:  None. FINDINGS: Gallbladder: A 2.0 mm echogenic polyp is seen within the nondependent portion of the gallbladder wall. No gallstones or wall thickening visualized (1.9 mm). No sonographic Murphy sign noted by sonographer. Common bile duct: Diameter: 5.1 mm Liver: No focal lesion identified. Within normal limits in parenchymal echogenicity. Portal vein is patent on color Doppler imaging with normal direction of blood flow towards the liver. Other: None. IMPRESSION: 2.0 mm gallbladder polyp. Correlation with follow-up right upper quadrant ultrasound is recommended to confirm stability. Electronically Signed   By: Virgina Norfolk M.D.   On: 11/29/2019 17:34   Scheduled Meds: . vitamin C  500 mg Oral Daily  . citalopram  10 mg Oral Daily  . dexamethasone (DECADRON) injection  6 mg Intravenous Daily  . enoxaparin (LOVENOX) injection  40 mg Subcutaneous Q24H  . Ipratropium-Albuterol  1 puff Inhalation Q6H  . polyethylene glycol  17 g Oral Daily  . senna  2 tablet Oral QHS  . zinc sulfate  220 mg Oral Daily   Continuous Infusions:   LOS: 9 days   Kerney Elbe, DO [facility] PAGER is on AMION  If 7PM-7AM, please contact night-coverage [email or web address]

## 2019-12-01 NOTE — TOC Progression Note (Signed)
Transition of Care [facility]) - Progression Note    Patient Details  Name: [name] MRN: [id number] Date of Birth: 05-Feb-1946  Transition of Care [facility]) CM/SW [place], [place] Phone Number: 12/01/2019, 12:17 PM  Clinical Narrative:    No bed offers- no facilities taking COVID+ pts able to accept at this time.    Expected Discharge Plan: Skilled Nursing Facility Barriers to Discharge: SNF Pending bed offer, Insurance Authorization, Continued Medical Work up  Expected Discharge Plan and Services Expected Discharge Plan: [place] arrangements for the past 2 months: Single Family Home  Readmission Risk Interventions No flowsheet data found.

## 2019-12-01 NOTE — Progress Notes (Signed)
PROGRESS NOTE    [name]  YYQ:[id number] DOB: 1945/11/29 DOA: 11/21/2019 PCP: Biagio Borg, MD  Brief Narrative:  The patient is an unvaccinated 74 year old obese African-American male with a past medical history significant for hypertension, depression, chronic thrombocytopenia, CKD stage IIIa, as well as other comorbidities who tested positive for Covid on 11/18/2019 and presented to the ED on 11/21/2019 with generalized weakness, cough, confusion.  In the ED he was found to be suffering with acute hypoxia and was admitted for further evaluation.  Initial CT of the chest showed no evidence of PE but did show patchy peripheral groundglass opacities.  Head CT was done on 8/3 as well and showed no acute disease.  MRI of the brain was also done and showed no infarct, hemorrhage or mass.  He completed his remdesivir course on 11/25/2019 and currently steroids stopped on 11/27/19 (Completed 7 days) and restarted 11/30/19.  Patient's bilirubin was still elevated so we obtained a right upper quadrant ultrasound and WBC is elevated likely in the setting of steroid margination and is slowly trending down.  Patient currently feels okay and still awaiting SNF bed placement as there are no bed offers currently given his COVID-19 positive status. Per social work currently no facilities taking Covid patients that are positive and patient has no bed offers.  Assessment & Plan:   Principal Problem:   Acute hypoxemic respiratory failure due to COVID-19 [facility]) Active Problems:   Essential hypertension   Depression   Thrombocytopenia (HCC)   CKD (chronic kidney disease), stage III   Acute metabolic encephalopathy   Pressure injury of skin   Confusion   COVID-19 virus infection   Hyperbilirubinemia   Hypoxia   Shortness of breath   Covid pneumonia with acute hypoxic respiratory failure -Hypoxia improved - completed Remdesivir - steroid course discontinued after 7 days as patient clinically much improved  and no longer hypoxic but because CRP trending back up restarted Dexamethasone 6 mg Daily x3 Days and today is day 2 of 3 -Inflammatory Marker Trend Recent Labs    11/30/19 0230 11/30/19 2117  DDIMER 0.78* 1.18*  LDH 245* 230*  CRP 11.5* 9.0*    Lab Results  Component Value Date   SARSCOV2NAA POSITIVE (A) 11/18/2019   SARSCOV2NAA NOT DETECTED 12/21/2018   -SpO2: 97 % O2 Flow Rate (L/min): 2 L/min FiO2 (%): (!) 3 %; NO longer on Supplemental O2 via [place] -C/w albuterol 1 to 2 puffs elations every 4 hours as needed wheezing shortness of breath along with Robitussin-DM and continue with Supportive Care -Repeat CTA showed "Negative for acute pulmonary embolus. Progressed bilateral COVID-19 Pneumonia since 11/18/2019. No pleural effusion. Calcified coronary artery and Aortic Atherosclerosis" -CXR from 11/30/2019 showed "Cardiac shadows within normal limits. Bibasilar opacities are noted consistent with the given clinical history of COVID-19 positivity and stable from the recent CT. No new focal abnormality is noted."  Significant Acute increase in D-Dimer  -No evidence of DVT on venous duplex  -Repeat CTa without evidence of PE  -D-dimer has acutely declined  and D-dimers trending down further and went from 1.15 is now 0.78 and is slowly trending back up and now 1.18 -Resumed prophylactic dose Lovenox only for now and recommending out of bed with assistance  Acute metabolic encephalopathy -Felt to be due to a combination of hypoxia and acute kidney injury  -Head CT showed "Age related cerebral atrophy, ventriculomegaly and advanced periventricular white matter disease. No acute intracranial findings or mass lesions." -MRI showed "No evidence  of recent infarction, hemorrhage, or mass. Moderate chronic microvascular ischemic changes. Mild to moderate burden of chronic microhemorrhages, which may reflect chronic hypertension or amyloid angiopathy." -slowly improving  -stopped steroid early  in hopes of assisting w/ recovery -Continue with delirium Precautions    Acute kidney injury on CKD stage IIIa -Felt to be hemodynamically mediated -has improved with support  - Lab Results  Component Value Date   CREATININE 1.24 11/30/2019   CREATININE 1.02 11/30/2019   CREATININE 1.13 11/29/2019  -Avoid nephrotoxic medications, contrast dyes, hypotension and renally dose medications -Repeat CMP in a.m.  Elevated RPR titer -Titer mildly elevated at 1: 2 but Treponema pallidum antibody negative and ID suggest this is likely a false positive RPR titer with no further work-up required  Abnormal LFTs Hyperbilirubinemia Likely related to COVID-19 and treatment of same -improving -Will check RUQ given that Bilirubin remains elevated; right upper quadrant ultrasound showed a 2 mm gallbladder polyp but no other acute findings. Hepatic Function Latest Ref Rng & Units 11/30/2019 11/30/2019 11/29/2019  Total Protein 6.5 - 8.1 g/dL 7.0 6.4(L) 6.5  Albumin 3.5 - 5.0 g/dL 2.7(L) 2.5(L) 2.7(L)  AST 15 - 41 U/L '21 21 23  ' ALT 0 - 44 U/L 31 30 33  Alk Phosphatase 38 - 126 U/L 79 68 68  Total Bilirubin 0.3 - 1.2 mg/dL 0.8 1.0 1.7(H)  Bilirubin, Direct 0.0 - 0.3 mg/dL - - -  -Continue to Monitor and Trend Carefully; LFTs have normalized now -Repeat CMP in the AM  Chronic Thrombocytopenia -Stable and last platelet count is actually 199,000 today  HTN -Blood pressure reasonably controlled -BP is now 100/70  Depression -Continue Celexa  Deconditioning/physical debility -Due to acute severe illness  -SNF recommended by physical therapy --medically stable for discharge and simply awaiting SNF bed but currently no bed offers   Obesity -Estimated body mass index is 30.35 kg/m as calculated from the following:   Height as of this encounter: '5\' 7"'  (1.702 m).   Weight as of this encounter: 87.9 kg. -Weight Loss and Dietary Counseling given   DVT prophylaxis: Enoxaparin 40 mg sq  q24h Code Status: FULL CODE Family Communication: No family present at bedside  Disposition Plan: SNF when bed available   Status is: Inpatient  Remains inpatient appropriate because:Unsafe d/c plan and Inpatient level of care appropriate due to severity of illness   Dispo:  Patient From: [place]  Planned Disposition: [place]  Expected discharge date: 11/26/19  Medically stable for discharge: Yes  Consultants:   None   Procedures: LE DUPLEX RIGHT:  - There is no evidence of deep vein thrombosis in the lower extremity.    - No cystic structure found in the popliteal fossa.    LEFT:  - There is no evidence of deep vein thrombosis in the lower extremity.    - No cystic structure found in the popliteal fossa.    *See table(s) above for measurements and observations.   Antimicrobials:  Anti-infectives (From admission, onward)   Start     Dose/Rate Route Frequency Ordered Stop   11/22/19 1000  remdesivir 100 mg in sodium chloride 0.9 % 100 mL IVPB       "Followed by" Linked Group Details   100 mg 200 mL/hr over 30 Minutes Intravenous Daily 11/21/19 1027 11/25/19 2035   11/22/19 1000  remdesivir 100 mg in sodium chloride 0.9 % 100 mL IVPB  Status:  Discontinued       "Followed by" Linked  Group Details   100 mg 200 mL/hr over 30 Minutes Intravenous Daily 11/21/19 1353 11/21/19 1354   11/21/19 1400  remdesivir 200 mg in sodium chloride 0.9% 250 mL IVPB  Status:  Discontinued       "Followed by" Linked Group Details   200 mg 580 mL/hr over 30 Minutes Intravenous Once 11/21/19 1353 11/21/19 1354   11/21/19 1130  remdesivir 200 mg in sodium chloride 0.9% 250 mL IVPB       "Followed by" Linked Group Details   200 mg 580 mL/hr over 30 Minutes Intravenous Once 11/21/19 1027 11/21/19 1338     Subjective: Seen and examined at bedside and is again sitting in the chair at bedside with no issues. He is speaking to somebody on the telephone.  Denies any chest pain, lightheadedness or dizziness. States that he is using his flutter valve and is using his incentive spirometer. Denies any shortness of breath and states he has had a bowel movement.. No other concerns or complaints this time.  Objective: Vitals:   11/30/19 1300 11/30/19 2208 12/01/19 0408 12/01/19 1358  BP: (!) 144/90 128/86 91/60 100/70  Pulse: 74 74 80 82  Resp: '16 17 17 17  ' Temp: 98.1 F (36.7 C) 98.2 F (36.8 C) 97.7 F (36.5 C) 97.8 F (36.6 C)  TempSrc: [place]  SpO2: 97% 98% 98% 97%  Weight:      Height:        Intake/Output Summary (Last 24 hours) at 12/01/2019 1517 Last data filed at 12/01/2019 1510 Gross per 24 hour  Intake 480 ml  Output 300 ml  Net 180 ml   Filed Weights   11/25/19 0444 11/26/19 0417 11/28/19 2252  Weight: 91 kg 91.4 kg 87.9 kg   Examination: Physical Exam:  Constitutional: WN/WD obese African-American male currently no acute distress appears calm and comfortable laying in the chair next to the bedside talking on the phone. Eyes: Lids and conjunctivae normal, sclerae anicteric  ENMT: External Ears, Nose appear normal. Grossly normal hearing.  Neck: Appears normal, supple, no cervical masses, normal ROM, no appreciable thyromegaly; no JVD Respiratory: Diminished to auscultation bilaterally with coarse breath sounds but no wheezing, rales, rhonchi or crackles. Normal respiratory effort and patient is not tachypenic. No accessory muscle use.  Unlabored breathing Cardiovascular: RRR, no murmurs / rubs / gallops. S1 and S2 auscultated. No extremity edema.  Abdomen: Soft, non-tender, distended secondary to body habitus. Bowel sounds positive.  GU: Deferred. Musculoskeletal: No clubbing / cyanosis of digits/nails. No joint deformity upper and lower extremities. Skin: No rashes, lesions, ulcers on limited skin evaluation. No induration; Warm and dry.  Neurologic: CN 2-12 grossly intact with no focal deficits. Romberg  sign and cerebellar reflexes not assessed.  Psychiatric: Normal judgment and insight. Alert and oriented x 3. Normal mood and appropriate affect.   Data Reviewed: I have personally reviewed following labs and imaging studies  CBC: Recent Labs  Lab 11/25/19 0844 11/26/19 0042 11/29/19 0401 11/30/19 0230 11/30/19 2117  WBC 7.5 6.5 16.6* 13.2* 11.6*  NEUTROABS 6.1 5.1  --  11.6* 10.6*  HGB 13.2 13.1 12.8* 13.1 12.9*  HCT 41.8 40.2 40.3 39.9 39.8  MCV 88.7 88.9 88.8 88.9 87.9  PLT 192 141* 194 173 465   Basic Metabolic Panel: Recent Labs  Lab 11/26/19 0042 11/28/19 0312 11/29/19 0401 11/30/19 0230 11/30/19 2117  NA 141 137 139 137 138  K 4.5 4.4 4.0 3.8 4.5  CL 105 102 105 103  100  CO2 '25 26 25 25 28  ' GLUCOSE 100* 103* 95 119* 126*  BUN '23 20 18 17 22  ' CREATININE 1.07 1.13 1.13 1.02 1.24  CALCIUM 8.7* 8.9 8.8* 8.7* 9.0  MG  --   --   --  2.2 2.3  PHOS  --   --   --  3.0 3.9   GFR: Estimated Creatinine Clearance: 55.3 mL/min (by C-G formula based on SCr of 1.24 mg/dL). Liver Function Tests: Recent Labs  Lab 11/25/19 0844 11/26/19 0042 11/29/19 0401 11/30/19 0230 11/30/19 2117  AST 57* 43* '23 21 21  ' ALT 62* 52* 33 30 31  ALKPHOS 53 63 68 68 79  BILITOT 0.7 0.8 1.7* 1.0 0.8  PROT 6.5 6.2* 6.5 6.4* 7.0  ALBUMIN 2.9* 2.9* 2.7* 2.5* 2.7*   No results for input(s): LIPASE, AMYLASE in the last 168 hours. No results for input(s): AMMONIA in the last 168 hours. Coagulation Profile: No results for input(s): INR, PROTIME in the last 168 hours. Cardiac Enzymes: No results for input(s): CKTOTAL, CKMB, CKMBINDEX, TROPONINI in the last 168 hours. BNP (last 3 results) No results for input(s): PROBNP in the last 8760 hours. HbA1C: No results for input(s): HGBA1C in the last 72 hours. CBG: Recent Labs  Lab 11/27/19 0757 11/27/19 1200 11/27/19 1641  GLUCAP 84 117* 115*   Lipid Profile: No results for input(s): CHOL, HDL, LDLCALC, TRIG, CHOLHDL, LDLDIRECT in the last  72 hours. Thyroid Function Tests: No results for input(s): TSH, T4TOTAL, FREET4, T3FREE, THYROIDAB in the last 72 hours. Anemia Panel: No results for input(s): VITAMINB12, FOLATE, FERRITIN, TIBC, IRON, RETICCTPCT in the last 72 hours. Sepsis Labs: No results for input(s): PROCALCITON, LATICACIDVEN in the last 168 hours.  Recent Results (from the past 240 hour(s))  Culture, blood (Routine X 2) w Reflex to ID Panel     Status: None   Collection Time: 11/21/19  4:43 PM   Specimen: BLOOD  Result Value Ref Range Status   Specimen Description BLOOD SITE NOT SPECIFIED  Final   Special Requests   Final    BOTTLES DRAWN AEROBIC AND ANAEROBIC Blood Culture adequate volume   Culture   Final    NO GROWTH 5 DAYS Performed at [facility], [address], [place], [place] [address]    Report Status 11/26/2019 FINAL  Final  Culture, blood (Routine X 2) w Reflex to ID Panel     Status: None   Collection Time: 11/21/19  7:30 PM   Specimen: BLOOD  Result Value Ref Range Status   Specimen Description BLOOD BLOOD LEFT FOREARM  Final   Special Requests   Final    BOTTLES DRAWN AEROBIC ONLY Blood Culture results may not be optimal due to an inadequate volume of blood received in culture bottles   Culture   Final    NO GROWTH 5 DAYS Performed at [facility], [place] [address], [place], [place] [address]    Report Status 11/26/2019 FINAL  Final     RN Pressure Injury Documentation: Pressure Injury 11/26/19 Sacrum Mid Stage 2 -  Partial thickness loss of dermis presenting as a shallow open injury with a red, pink wound bed without slough. (Active)  11/26/19 2056  Location: Sacrum  Location Orientation: Mid  Staging: Stage 2 -  Partial thickness loss of dermis presenting as a shallow open injury with a red, pink wound bed without slough.  Wound Description (Comments):   Present on Admission:  Estimated body mass index is 30.35 kg/m as calculated from the following:   Height as of this  encounter: '5\' 7"'  (1.702 m).   Weight as of this encounter: 87.9 kg.  Malnutrition Type:      Malnutrition Characteristics:      Nutrition Interventions:     Radiology Studies: DG CHEST PORT 1 VIEW  Result Date: 11/30/2019 CLINICAL DATA:  Shortness of breath, COVID-19 positivity EXAM: PORTABLE CHEST 1 VIEW COMPARISON:  11/28/2019 CT FINDINGS: Cardiac shadows within normal limits. Bibasilar opacities are noted consistent with the given clinical history of COVID-19 positivity and stable from the recent CT. No new focal abnormality is noted. IMPRESSION: Stable bibasilar opacities consistent with the given clinical history. Electronically Signed   By: Inez Catalina M.D.   On: 11/30/2019 07:20   US Abdomen Limited RUQ  Result Date: 11/29/2019 CLINICAL DATA:  Hyperbilirubinemia. EXAM: ULTRASOUND ABDOMEN LIMITED RIGHT UPPER QUADRANT COMPARISON:  None. FINDINGS: Gallbladder: A 2.0 mm echogenic polyp is seen within the nondependent portion of the gallbladder wall. No gallstones or wall thickening visualized (1.9 mm). No sonographic Murphy sign noted by sonographer. Common bile duct: Diameter: 5.1 mm Liver: No focal lesion identified. Within normal limits in parenchymal echogenicity. Portal vein is patent on color Doppler imaging with normal direction of blood flow towards the liver. Other: None. IMPRESSION: 2.0 mm gallbladder polyp. Correlation with follow-up right upper quadrant ultrasound is recommended to confirm stability. Electronically Signed   By: Virgina Norfolk M.D.   On: 11/29/2019 17:34   Scheduled Meds: . vitamin C  500 mg Oral Daily  . citalopram  10 mg Oral Daily  . dexamethasone (DECADRON) injection  6 mg Intravenous Daily  . enoxaparin (LOVENOX) injection  40 mg Subcutaneous Q24H  . Ipratropium-Albuterol  1 puff Inhalation Q6H  . polyethylene glycol  17 g Oral Daily  . senna  2 tablet Oral QHS  . zinc sulfate  220 mg Oral Daily   Continuous Infusions:   LOS: 10 days    Kerney Elbe, DO [facility] PAGER is on AMION  If 7PM-7AM, please contact night-coverage [email or web address]

## 2019-12-01 NOTE — Progress Notes (Signed)
Occupational Therapy Treatment Patient Details Name: [name] MRN: [id number] DOB: 01-10-1946 Today's Date: 12/01/2019    History of present illness Pt is a 74 y.o. male presenting 11/21/19 with productive cough, AMS, and weakness of 1 week in duration. Head CT and MRI clear. Negative for PE. Found to be Covid positive and admitted for acute hypoxemic respiratory failure due to Covid infection. PMH includes HTN, PTSD.   OT comments  OT treatment session with focus on self-care re-education, cognitive reorientation, household mobility and activity tolerance. Patient completed toileting/hygiene/clothing management with Min A, grooming tasks standing at sink level with Min A, and functional mobility in room with CGA to Min A and use of RW with 1 lateral LOB. Patient continues to be limited by cognitive deficits only oriented to name/D.O.B this date, decreased activity tolerance requiring seated rest breaks after ambulation, and generalized weakness. Continued recommendation for SNF rehab give patients current deficits.    Follow Up Recommendations  SNF;Supervision/Assistance - 24 hour    Equipment Recommendations  Other (comment) (Defer to next level of care )    Recommendations for Other Services      Precautions / Restrictions Precautions Precautions: Fall;Other (comment) Precaution Comments: Urine incontinence Restrictions Weight Bearing Restrictions: No       Mobility Bed Mobility Overal bed mobility: Needs Assistance             General bed mobility comments: Patient seated in recliner upon entry.   Transfers Overall transfer level: Needs assistance Equipment used: Rolling walker (2 wheeled) Transfers: Sit to/from Stand Sit to Stand: Min assist         General transfer comment: Min A from low surfaces and CGA from elevated surfaces for STS transfers to RW.     Balance Overall balance assessment: Needs assistance Sitting-balance support: Feet supported;No  upper extremity supported Sitting balance-Leahy Scale: Fair     Standing balance support: Bilateral upper extremity supported Standing balance-Leahy Scale: Poor Standing balance comment: Heavy reliance on BUE on RW and external assist                           ADL either performed or assessed with clinical judgement   ADL Overall ADL's : Needs assistance/impaired     Grooming: Minimal assistance;Standing Grooming Details (indicate cue type and reason): Patient completed 3/3 grooming tasks standing at sink with Min A x1 lateral LOB and cues for initiation/termination of tasks and cues for sequencing.                  Toilet Transfer: Minimal Insurance claims handler Details (indicate cue type and reason): Min A with cues for safety and hand placement.  Toileting- Clothing Manipulation and Hygiene: Minimal assistance;Sit to/from stand Toileting - Clothing Manipulation Details (indicate cue type and reason): Min A for hygiene/clothing managment for standing balance and thoroughness.      Functional mobility during ADLs: Minimal assistance;Rolling walker       Vision       Perception     Praxis      Cognition Arousal/Alertness: Awake/alert Behavior During Therapy: Flat affect Overall Cognitive Status: No family/caregiver present to determine baseline cognitive functioning Area of Impairment: Orientation;Attention;Memory;Following commands;Safety/judgement;Awareness;Problem solving                 Orientation Level: Disoriented to;Place;Time;Situation Current Attention Level: Sustained Memory: Decreased recall of precautions;Decreased short-term memory Following Commands: Follows one step commands inconsistently;Follows one step commands with increased time Safety/Judgement:  Decreased awareness of safety;Decreased awareness of deficits Awareness: Intellectual Problem Solving: Slow processing;Decreased initiation;Difficulty sequencing;Requires  verbal cues;Requires tactile cues General Comments: Pt with difficulty following even basic commands, unable to sequence through tasks without multimodal cues.          Exercises     Shoulder Instructions       General Comments      Pertinent Vitals/ Pain       Pain Assessment: No/denies pain  Home Living                                          Prior Functioning/Environment              Frequency  Min 2X/week        Progress Toward Goals  OT Goals(current goals can now be found in the care plan section)  Progress towards OT goals: Progressing toward goals  Acute Rehab OT Goals Patient Stated Goal: No goals stated  OT Goal Formulation: With patient Time For Goal Achievement: 12/07/19 Potential to Achieve Goals: Good ADL Goals Pt Will Perform Grooming: with supervision;standing Pt Will Perform Lower Body Bathing: with supervision;sit to/from stand Pt Will Perform Lower Body Dressing: with supervision;sit to/from stand Pt Will Transfer to Toilet: with min guard assist;stand pivot transfer;bedside commode Pt Will Perform Toileting - Clothing Manipulation and hygiene: with min guard assist;sit to/from stand Additional ADL Goal #1: Pt to demonstrate pursed lip breathing during/after activities with minimal cues in order to maximize safety and improve O2 stats during ADLs  Plan Discharge plan remains appropriate    Co-evaluation                 AM-PAC OT "6 Clicks" Daily Activity     Outcome Measure   Help from another person eating meals?: A Little Help from another person taking care of personal grooming?: A Little Help from another person toileting, which includes using toliet, bedpan, or urinal?: A Lot Help from another person bathing (including washing, rinsing, drying)?: A Lot Help from another person to put on and taking off regular upper body clothing?: A Little Help from another person to put on and taking off regular lower  body clothing?: A Lot 6 Click Score: 15    End of Session Equipment Utilized During Treatment: Gait belt;Rolling walker  OT Visit Diagnosis: Unsteadiness on feet (R26.81);Other abnormalities of gait and mobility (R26.89);Muscle weakness (generalized) (M62.81);Other symptoms and signs involving cognitive function   Activity Tolerance     Patient Left     Nurse Communication          Time: 1400-1440 OT Time Calculation (min): 40 min  Charges: OT General Charges $OT Visit: 1 Visit OT Treatments $Self Care/Home Management : 23-37 mins $Therapeutic Activity: 8-22 mins  Glenice Ciccone H. OTR/L Supplemental OT, Department of rehab services [phone]   Lindsie Simar R H. 12/01/2019, 2:48 PM

## 2019-12-02 DIAGNOSIS — R42 Dizziness and giddiness: Secondary | ICD-10-CM

## 2019-12-02 LAB — FIBRINOGEN: Fibrinogen: 739 mg/dL — ABNORMAL HIGH (ref 210–475)

## 2019-12-02 LAB — CBC WITH DIFFERENTIAL/PLATELET
Abs Immature Granulocytes: 0.07 10*3/uL (ref 0.00–0.07)
Basophils Absolute: 0 10*3/uL (ref 0.0–0.1)
Basophils Relative: 0 %
Eosinophils Absolute: 0 10*3/uL (ref 0.0–0.5)
Eosinophils Relative: 0 %
HCT: 39.6 % (ref 39.0–52.0)
Hemoglobin: 12.9 g/dL — ABNORMAL LOW (ref 13.0–17.0)
Immature Granulocytes: 1 %
Lymphocytes Relative: 8 %
Lymphs Abs: 0.6 10*3/uL — ABNORMAL LOW (ref 0.7–4.0)
MCH: 28.9 pg (ref 26.0–34.0)
MCHC: 32.6 g/dL (ref 30.0–36.0)
MCV: 88.8 fL (ref 80.0–100.0)
Monocytes Absolute: 0.6 10*3/uL (ref 0.1–1.0)
Monocytes Relative: 8 %
Neutro Abs: 6.2 10*3/uL (ref 1.7–7.7)
Neutrophils Relative %: 83 %
Platelets: 176 10*3/uL (ref 150–400)
RBC: 4.46 MIL/uL (ref 4.22–5.81)
RDW: 13 % (ref 11.5–15.5)
WBC: 7.5 10*3/uL (ref 4.0–10.5)
nRBC: 0 % (ref 0.0–0.2)

## 2019-12-02 LAB — SEDIMENTATION RATE: Sed Rate: 75 mm/hr — ABNORMAL HIGH (ref 0–16)

## 2019-12-02 LAB — COMPREHENSIVE METABOLIC PANEL
ALT: 36 U/L (ref 0–44)
AST: 29 U/L (ref 15–41)
Albumin: 2.8 g/dL — ABNORMAL LOW (ref 3.5–5.0)
Alkaline Phosphatase: 75 U/L (ref 38–126)
Anion gap: 8 (ref 5–15)
BUN: 22 mg/dL (ref 8–23)
CO2: 29 mmol/L (ref 22–32)
Calcium: 9.2 mg/dL (ref 8.9–10.3)
Chloride: 99 mmol/L (ref 98–111)
Creatinine, Ser: 1.14 mg/dL (ref 0.61–1.24)
GFR calc Af Amer: >60 mL/min (ref >60)
GFR calc non Af Amer: >60 mL/min (ref >60)
Glucose, Bld: 110 mg/dL — ABNORMAL HIGH (ref 70–99)
Potassium: 4.7 mmol/L (ref 3.5–5.1)
Sodium: 136 mmol/L (ref 135–145)
Total Bilirubin: 0.8 mg/dL (ref 0.3–1.2)
Total Protein: 6.8 g/dL (ref 6.5–8.1)

## 2019-12-02 LAB — LACTATE DEHYDROGENASE: LDH: 239 U/L — ABNORMAL HIGH (ref 98–192)

## 2019-12-02 LAB — C-REACTIVE PROTEIN: CRP: 3.7 mg/dL — ABNORMAL HIGH (ref <1.0)

## 2019-12-02 LAB — PHOSPHORUS: Phosphorus: 3.1 mg/dL (ref 2.5–4.6)

## 2019-12-02 LAB — MAGNESIUM: Magnesium: 2.3 mg/dL (ref 1.7–2.4)

## 2019-12-02 LAB — D-DIMER, QUANTITATIVE: D-Dimer, Quant: 0.74 ug/mL-FEU — ABNORMAL HIGH (ref 0.00–0.50)

## 2019-12-02 MED ORDER — SODIUM CHLORIDE 0.9 % IV BOLUS
1000.0000 mL | Freq: Once | INTRAVENOUS | Status: AC
  Administered 2019-12-02: 1000 mL via INTRAVENOUS

## 2019-12-02 MED ORDER — SODIUM CHLORIDE 0.9 % IV SOLN: INTRAVENOUS | Status: DC

## 2019-12-02 NOTE — Progress Notes (Signed)
Physical Therapy Treatment Patient Details Name: [name] MRN: [id number] DOB: 09-May-1945 Today's Date: 12/02/2019    History of Present Illness Pt is a 74 y.o. male presenting 11/21/19 with productive cough, AMS, and weakness of 1 week in duration. Head CT and MRI clear. Negative for PE. Found to be Covid positive and admitted for acute hypoxemic respiratory failure due to Covid infection. PMH includes HTN, PTSD.    PT Comments    Pt in bed and lethargic upon PT arrival, SpO2 84%, pt placed on 2L O2. TED hose and abdominal binder placed before getting up from bed. On 2L, SPO2 100%. Pt ambulated 15' with RW and min A and then became light headed and needed to cease ambulation. BP 171/108 and HR 68 bpm. Pt continues to be very slow to process instructions. Continue to recommend post acute rehab, SNF setting. PT will continue to follow.    Follow Up Recommendations  SNF     Equipment Recommendations  Rolling walker with 5" wheels    Recommendations for Other Services       Precautions / Restrictions Precautions Precautions: Fall Precaution Comments: watch O2 sats and BP Restrictions Weight Bearing Restrictions: No    Mobility  Bed Mobility Overal bed mobility: Needs Assistance Bed Mobility: Supine to Sit;Rolling Rolling: Supervision   Supine to sit: Min assist     General bed mobility comments: rolled R and L for donning abdominal binder at RN request. Sup>sit with min HHA  Transfers Overall transfer level: Needs assistance Equipment used: Rolling walker (2 wheeled) Transfers: Sit to/from Stand Sit to Stand: Min assist         General transfer comment: min A to steady, performed several times for functional training  Ambulation/Gait Ambulation/Gait assistance: Min assist Gait Distance (Feet): 15 Feet Assistive device: Rolling walker (2 wheeled) Gait Pattern/deviations: Trunk flexed;Step-through pattern;Decreased stride length Gait velocity: Decreased Gait  velocity interpretation: <1.31 ft/sec, indicative of household ambulator General Gait Details: pt with slow gait, trunk flexed, after 15' became light headed and stepped bkwd 5' to foot of bed with mod A for sequencing and sat   Stairs             Wheelchair Mobility    Modified Rankin (Stroke Patients Only)       Balance Overall balance assessment: Needs assistance Sitting-balance support: Feet supported;No upper extremity supported Sitting balance-Leahy Scale: Good     Standing balance support: Bilateral upper extremity supported Standing balance-Leahy Scale: Fair Standing balance comment: Heavy reliance on BUE on RW and external assist                            Cognition Arousal/Alertness: Awake/alert Behavior During Therapy: Flat affect Overall Cognitive Status: No family/caregiver present to determine baseline cognitive functioning Area of Impairment: Orientation;Attention;Memory;Following commands;Safety/judgement;Awareness;Problem solving                 Orientation Level: Disoriented to;Time Current Attention Level: Sustained Memory: Decreased recall of precautions;Decreased short-term memory Following Commands: Follows one step commands consistently Safety/Judgement: Decreased awareness of safety;Decreased awareness of deficits Awareness: Intellectual Problem Solving: Slow processing;Decreased initiation;Difficulty sequencing;Requires verbal cues;Requires tactile cues General Comments: pt continues to be slow to follow commands and participate in conversation. Seems new for him as he was working as a [employer]arry manager in [employer] Comments General comments (skin integrity, edema, etc.): on RA pt's  SPO2 initially 84% and pt with dyspnea with conversation. Kept on 2L O2 for mobility and SpO2 100%. HR 68 bpm. BP 171/108 after ambulation.      Pertinent Vitals/Pain Pain Assessment: No/denies pain    Home  Living                      Prior Function            PT Goals (current goals can now be found in the care plan section) Acute Rehab PT Goals Patient Stated Goal: return home PT Goal Formulation: Patient unable to participate in goal setting Time For Goal Achievement: 12/06/19 Potential to Achieve Goals: Fair Progress towards PT goals: Progressing toward goals    Frequency    Min 2X/week      PT Plan Current plan remains appropriate    Co-evaluation              AM-PAC PT "6 Clicks" Mobility   Outcome Measure  Help needed turning from your back to your side while in a flat bed without using bedrails?: A Little Help needed moving from lying on your back to sitting on the side of a flat bed without using bedrails?: A Little Help needed moving to and from a bed to a chair (including a wheelchair)?: A Little Help needed standing up from a chair using your arms (e.g., wheelchair or bedside chair)?: A Little Help needed to walk in hospital room?: A Lot Help needed climbing 3-5 steps with a railing? : Total 6 Click Score: 15    End of Session Equipment Utilized During Treatment: Gait belt Activity Tolerance: Other (comment) (light headed) Patient left: with call bell/phone within reach;in chair;with chair alarm set Nurse Communication: Mobility status PT Visit Diagnosis: Unsteadiness on feet (R26.81);Difficulty in walking, not elsewhere classified (R26.2);Muscle weakness (generalized) (M62.81)     Time: 0037-0488 PT Time Calculation (min) (ACUTE ONLY): 42 min  Charges:  $Gait Training: 8-22 mins $Therapeutic Activity: 23-37 mins                     Leighton Roach, [place]  Pager [phone] Office [place] 12/02/2019, 4:41 PM

## 2019-12-02 NOTE — Progress Notes (Signed)
Occupational Therapy Treatment Patient Details Name: [name] MRN: [id number] DOB: 09/11/1945 Today's Date: 12/02/2019    History of present illness Pt is a 74 y.o. male presenting 11/21/19 with productive cough, AMS, and weakness of 1 week in duration. Head CT and MRI clear. Negative for PE. Found to be Covid positive and admitted for acute hypoxemic respiratory failure due to Covid infection. PMH includes HTN, PTSD.   OT comments  Pt. Has decreased safety and I with ADLs and mobiltiy. Pt. Sat EOB for ADLs with good sitting balance. Pt. Was an agreement to AMB to bathroom with walker. Once pt. Was in bathroom he started to fall and the 3-1 commode was put under him to prevent fall. Pt. Appeared to be passing out and nursing was called to assist. Nursing and MD came and assessed pt. And think he has orthostatic hypotension. MD ordered ted hose and abdominal binder. Acute OT to continue to see pt.   Follow Up Recommendations  SNF;Supervision/Assistance - 24 hour    Equipment Recommendations       Recommendations for Other Services      Precautions / Restrictions Precautions Precautions: Fall;Other (comment) Precaution Comments: Urine incontinence Restrictions Weight Bearing Restrictions: No       Mobility Bed Mobility         Supine to sit: Supervision Sit to supine: Supervision      Transfers Overall transfer level: Needs assistance Equipment used: Rolling walker (2 wheeled) Transfers: Sit to/from Stand Sit to Stand: Min assist         General transfer comment: Min A from low surfaces and CGA from elevated surfaces for STS transfers to RW.     Balance     Sitting balance-Leahy Scale: Good       Standing balance-Leahy Scale: Fair                             ADL either performed or assessed with clinical judgement   ADL                   Upper Body Dressing : Minimal assistance;Sitting   Lower Body Dressing: Moderate  assistance;Sit to/from stand   Toilet Transfer: Minimal Insurance claims handler Details (indicate cue type and reason): Min A with cues for safety and hand placement.  Toileting- Clothing Manipulation and Hygiene: Minimal assistance;Sit to/from stand Toileting - Clothing Manipulation Details (indicate cue type and reason): Min A for hygiene/clothing managment for standing balance and thoroughness.      Functional mobility during ADLs: Minimal assistance;Rolling walker General ADL Comments: Pt. needs cues to increase performance.      Vision   Vision Assessment?: No apparent visual deficits   Perception     Praxis      Cognition Arousal/Alertness: Awake/alert Behavior During Therapy: Flat affect Overall Cognitive Status: No family/caregiver present to determine baseline cognitive functioning Area of Impairment: Orientation;Attention;Memory;Following commands;Safety/judgement;Awareness;Problem solving                 Orientation Level: Disoriented to;Time     Following Commands: Follows one step commands consistently Safety/Judgement: Decreased awareness of safety;Decreased awareness of deficits              Exercises     Shoulder Instructions       General Comments      Pertinent Vitals/ Pain       Pain Assessment: No/denies pain  Home Living  Prior Functioning/Environment              Frequency  Min 2X/week        Progress Toward Goals  OT Goals(current goals can now be found in the care plan section)  Progress towards OT goals: Progressing toward goals  Acute Rehab OT Goals Patient Stated Goal: No goals stated  OT Goal Formulation: With patient Time For Goal Achievement: 12/07/19 Potential to Achieve Goals: Good ADL Goals Pt Will Perform Grooming: with supervision;standing Pt Will Perform Lower Body Bathing: with supervision;sit to/from stand Pt Will Perform Lower Body  Dressing: with supervision;sit to/from stand Pt Will Transfer to Toilet: with min guard assist;stand pivot transfer;bedside commode Pt Will Perform Toileting - Clothing Manipulation and hygiene: with min guard assist;sit to/from stand Additional ADL Goal #1: Pt to demonstrate pursed lip breathing during/after activities with minimal cues in order to maximize safety and improve O2 stats during ADLs  Plan Discharge plan remains appropriate    Co-evaluation                 AM-PAC OT "6 Clicks" Daily Activity     Outcome Measure   Help from another person eating meals?: A Little Help from another person taking care of personal grooming?: A Little Help from another person toileting, which includes using toliet, bedpan, or urinal?: A Lot Help from another person bathing (including washing, rinsing, drying)?: A Lot Help from another person to put on and taking off regular upper body clothing?: A Little Help from another person to put on and taking off regular lower body clothing?: A Lot 6 Click Score: 15    End of Session Equipment Utilized During Treatment: Gait belt;Rolling walker  OT Visit Diagnosis: Unsteadiness on feet (R26.81);Other abnormalities of gait and mobility (R26.89);Muscle weakness (generalized) (M62.81);Other symptoms and signs involving cognitive function   Activity Tolerance Patient tolerated treatment well   Patient Left in bed;with call bell/phone within reach;with bed alarm set;with nursing/sitter in room   Nurse Communication Mobility status (Pt. with near fall)        Time: 1245-8099 OT Time Calculation (min): 32 min  Charges: OT General Charges $OT Visit: 1 Visit OT Treatments $Self Care/Home Management : 23-37 mins  Reece Packer OT/L    Mariyam Remington 12/02/2019, 1:56 PM

## 2019-12-02 NOTE — TOC Progression Note (Signed)
Transition of Care [facility]) - Progression Note    Patient Details  Name: [name] MRN: [id number] Date of Birth: 11/16/1945  Transition of Care [facility]) CM/SW [place], LCSW Phone Number: 12/02/2019, 8:44 AM  Clinical Narrative:    Per [facility], they are accepting COVID patients so CSW requested they review patient's referral.    Expected Discharge Plan: [place] Barriers to Discharge: SNF Pending bed offer, Insurance Authorization, Continued Medical Work up  Expected Discharge Plan and Services Expected Discharge Plan: [place] arrangements for the past 2 months: Single Family Home                                       Social Determinants of Health (SDOH) Interventions    Readmission Risk Interventions No flowsheet data found.

## 2019-12-02 NOTE — Progress Notes (Signed)
PROGRESS NOTE    [name]  URK:[id number] DOB: 28-Jan-1946 DOA: 11/21/2019 PCP: Biagio Borg, MD  Brief Narrative:  The patient is an unvaccinated 74 year old obese African-American male with a past medical history significant for hypertension, depression, chronic thrombocytopenia, CKD stage IIIa, as well as other comorbidities who tested positive for Covid on 11/18/2019 and presented to the ED on 11/21/2019 with generalized weakness, cough, confusion.  In the ED he was found to be suffering with acute hypoxia and was admitted for further evaluation.  Initial CT of the chest showed no evidence of PE but did show patchy peripheral groundglass opacities.  Head CT was done on 8/3 as well and showed no acute disease.  MRI of the brain was also done and showed no infarct, hemorrhage or mass.  He completed his remdesivir course on 11/25/2019 and currently steroids stopped on 11/27/19 (Completed 7 days) and restarted 11/30/19.  Patient's bilirubin was still elevated so we obtained a right upper quadrant ultrasound and WBC is elevated likely in the setting of steroid margination and is slowly trending down.  Patient currently feels okay and still awaiting SNF bed placement as there are no bed offers currently given his COVID-19 positive status. Per social work currently no facilities taking Covid patients that are positive and patient has no bed offers.  12/02/19 Patient became dizzy today and he was found to have orthostatic hypotension.  We will give a liter of fluid, start TED hose, abdominal binder and repeat orthostatics in a.m.  Assessment & Plan:   Principal Problem:   Acute hypoxemic respiratory failure due to COVID-19 [facility]) Active Problems:   Essential hypertension   Depression   Thrombocytopenia (HCC)   CKD (chronic kidney disease), stage III   Acute metabolic encephalopathy   Pressure injury of skin   Confusion   COVID-19 virus infection   Hyperbilirubinemia   Hypoxia   Shortness of  breath   Covid pneumonia with acute hypoxic respiratory failure -Hypoxia improved - completed Remdesivir - steroid course discontinued after 7 days as patient clinically much improved and no longer hypoxic but because CRP trending back up restarted Dexamethasone 6 mg Daily x3 Days and today is day he is completed 3 days -Inflammatory Marker Trend Recent Labs    11/30/19 0230 11/30/19 2117 12/02/19 0309  DDIMER 0.78* 1.18* 0.74*  LDH 245* 230* 239*  CRP 11.5* 9.0* 3.7*    Lab Results  Component Value Date   SARSCOV2NAA POSITIVE (A) 11/18/2019   SARSCOV2NAA NOT DETECTED 12/21/2018   -SpO2: 97 % O2 Flow Rate (L/min): 2 L/min FiO2 (%): (!) 3 %; NO longer on Supplemental O2 via [place] -C/w albuterol 1 to 2 puffs elations every 4 hours as needed wheezing shortness of breath along with Robitussin-DM and continue with Supportive Care -Repeat CTA showed "Negative for acute pulmonary embolus. Progressed bilateral COVID-19 Pneumonia since 11/18/2019. No pleural effusion. Calcified coronary artery and Aortic Atherosclerosis" -CXR from 11/30/2019 showed "Cardiac shadows within normal limits. Bibasilar opacities are noted consistent with the given clinical history of COVID-19 positivity and stable from the recent CT. No new focal abnormality is noted."  Significant Acute increase in D-Dimer  -No evidence of DVT on venous duplex  -Repeat CTa without evidence of PE  -D-dimer has acutely declined  and D-dimers trending down further and went from 1.15 is now 0.78 and is slowly trending back up and now 1.18 -> 0.74 -Resumed prophylactic dose Lovenox only for now and recommending out of bed with assistance  Acute  metabolic encephalopathy -Felt to be due to a combination of hypoxia and acute kidney injury  -Head CT showed "Age related cerebral atrophy, ventriculomegaly and advanced periventricular white matter disease. No acute intracranial findings or mass lesions." -MRI showed "No evidence of recent  infarction, hemorrhage, or mass. Moderate chronic microvascular ischemic changes. Mild to moderate burden of chronic microhemorrhages, which may reflect chronic hypertension or amyloid angiopathy." -slowly improving  -stopped steroid early in hopes of assisting w/ recovery -Continue with delirium Precautions    Acute kidney injury on CKD stage IIIa -Felt to be hemodynamically mediated -has improved with support  - Lab Results  Component Value Date   CREATININE 1.14 12/02/2019   CREATININE 1.24 11/30/2019   CREATININE 1.02 11/30/2019  -Avoid nephrotoxic medications, contrast dyes, hypotension and renally dose medications -Will give 1 Liter NS bolus and start Maintenance IVF with NS at 75 mL/hr for 12 hours  -Repeat CMP in a.m.  Elevated RPR titer -Titer mildly elevated at 1: 2 but Treponema pallidum antibody negative and ID suggest this is likely a false positive RPR titer with no further work-up required  Abnormal LFTs Hyperbilirubinemia Likely related to COVID-19 and treatment of same -improving -Will check RUQ given that Bilirubin remains elevated; right upper quadrant ultrasound showed a 2 mm gallbladder polyp but no other acute findings. Hepatic Function Latest Ref Rng & Units 12/02/2019 11/30/2019 11/30/2019  Total Protein 6.5 - 8.1 g/dL 6.8 7.0 6.4(L)  Albumin 3.5 - 5.0 g/dL 2.8(L) 2.7(L) 2.5(L)  AST 15 - 41 U/L _0 ALT 0 - 44 U/L 36 31 30  Alk Phosphatase 38 - 126 U/L 75 79 68  Total Bilirubin 0.3 - 1.2 mg/dL 0.8 0.8 1.0  Bilirubin, Direct 0.0 - 0.3 mg/dL - - -  -Continue to Monitor and Trend Carefully; LFTs have normalized now -Repeat CMP in the AM  Chronic Thrombocytopenia -Stable and last platelet count is actually 199,000 today  HTN -Blood pressure was significantly lower today and patient was Orthostatic and felt dizzy on the toilet as well. When going from laying to sitting his BP dropped -BP is now 129/106 -Continue to monitor carefully as he was  orthostatic today and currently not on any antihypertensives  Orthostatic Hypotension -Was orthostatic and dizzy today and blood pressure dropped from laying to sitting -We will bolus 1 L normal saline and start maintenance IV fluids at 75 mL's per hour for 12 hours -We will add TED hose and an abdominal binder -Check orthostatic vital signs again morning  Depression -Continue Celexa  Deconditioning/physical debility -Due to acute severe illness  -SNF recommended by physical therapy --medically stable for discharge and simply awaiting SNF bed but currently no bed offers   Obesity -Estimated body mass index is 30.35 kg/m as calculated from the following:   Height as of this encounter: _1  (1.702 m).   Weight as of this encounter: 87.9 kg. -Weight Loss and Dietary Counseling given   DVT prophylaxis: Enoxaparin 40 mg sq q24h Code Status: FULL CODE Family Communication: No family present at bedside  Disposition Plan: SNF when bed available and no longer orthostatic   Status is: Inpatient  Remains inpatient appropriate because:Unsafe d/c plan and Inpatient level of care appropriate due to severity of illness   Dispo:  Patient From: [place]  Planned Disposition: [place]  Expected discharge date: 11/26/19  Medically stable for discharge: Yes  Consultants:   None   Procedures: LE DUPLEX RIGHT:  - There is  no evidence of deep vein thrombosis in the lower extremity.    - No cystic structure found in the popliteal fossa.    LEFT:  - There is no evidence of deep vein thrombosis in the lower extremity.    - No cystic structure found in the popliteal fossa.    *See table(s) above for measurements and observations.   Antimicrobials:  Anti-infectives (From admission, onward)   Start     Dose/Rate Route Frequency Ordered Stop   11/22/19 1000  remdesivir 100 mg in sodium chloride 0.9 % 100 mL IVPB       "Followed by" Linked Group  Details   100 mg 200 mL/hr over 30 Minutes Intravenous Daily 11/21/19 1027 11/25/19 2035   11/22/19 1000  remdesivir 100 mg in sodium chloride 0.9 % 100 mL IVPB  Status:  Discontinued       "Followed by" Linked Group Details   100 mg 200 mL/hr over 30 Minutes Intravenous Daily 11/21/19 1353 11/21/19 1354   11/21/19 1400  remdesivir 200 mg in sodium chloride 0.9% 250 mL IVPB  Status:  Discontinued       "Followed by" Linked Group Details   200 mg 580 mL/hr over 30 Minutes Intravenous Once 11/21/19 1353 11/21/19 1354   11/21/19 1130  remdesivir 200 mg in sodium chloride 0.9% 250 mL IVPB       "Followed by" Linked Group Details   200 mg 580 mL/hr over 30 Minutes Intravenous Once 11/21/19 1027 11/21/19 1338     Subjective: Seen and examined at bedside he was walking back to the bed with his walker and the assistance of therapy and the nurse.  He states that he was dizzy not too long ago and felt as if he was going to pass out.  No other concerns or complaints at this time.  Objective: Vitals:   12/02/19 0155 12/02/19 0210 12/02/19 0453 12/02/19 1113  BP: (!) 164/105 (!) 155/103 (!) 131/94 (!) 129/106  Pulse:   78 83  Resp:   16   Temp:   97.7 F (36.5 C)   TempSrc:   Oral   SpO2:   98% 97%  Weight:      Height:        Intake/Output Summary (Last 24 hours) at 12/02/2019 1535 Last data filed at 12/02/2019 0900 Gross per 24 hour  Intake 540 ml  Output 350 ml  Net 190 ml   Filed Weights   11/25/19 0444 11/26/19 0417 11/28/19 2252  Weight: 91 kg 91.4 kg 87.9 kg   Examination: Physical Exam:  Constitutional: WN/WD obese African-American male currently in no acute distress and he is ambulating to the bed with assistance of his nurse and therapy.  States that he was feeling dizzy earlier but not now Eyes: Lids and conjunctivae normal, sclerae anicteric  ENMT: External Ears, Nose appear normal. Grossly normal hearing. Neck: Appears normal, supple, no cervical masses, normal ROM,  no appreciable thyromegaly; no JVD Respiratory: Diminished to auscultation bilaterally, no wheezing, rales, rhonchi or crackles. Normal respiratory effort and patient is not tachypenic. No accessory muscle use.  Unlabored breathing Cardiovascular: RRR, no murmurs / rubs / gallops. S1 and S2 auscultated.  Abdomen: Soft, non-tender, non-distended. Bowel sounds positive.  GU: Deferred. Musculoskeletal: No clubbing / cyanosis of digits/nails. No joint deformity upper and lower extremities.  Skin: No rashes, lesions, ulcers on limited skin evaluation. No induration; Warm and dry.  Neurologic: CN 2-12 grossly intact with no focal deficits.Romberg sign cerebellar reflexes  not assessed.  Psychiatric: Normal judgment and insight. Alert and oriented x 3. Normal mood and appropriate affect.   Data Reviewed: I have personally reviewed following labs and imaging studies  CBC: Recent Labs  Lab 11/26/19 0042 11/29/19 0401 11/30/19 0230 11/30/19 2117 12/02/19 0309  WBC 6.5 16.6* 13.2* 11.6* 7.5  NEUTROABS 5.1  --  11.6* 10.6* 6.2  HGB 13.1 12.8* 13.1 12.9* 12.9*  HCT 40.2 40.3 39.9 39.8 39.6  MCV 88.9 88.8 88.9 87.9 88.8  PLT 141* 194 173 199 376   Basic Metabolic Panel: Recent Labs  Lab 11/28/19 0312 11/29/19 0401 11/30/19 0230 11/30/19 2117 12/02/19 0309  NA 137 139 137 138 136  K 4.4 4.0 3.8 4.5 4.7  CL 102 105 103 100 99  CO2 _0 GLUCOSE 103* 95 119* 126* 110*  BUN _1 CREATININE 1.13 1.13 1.02 1.24 1.14  CALCIUM 8.9 8.8* 8.7* 9.0 9.2  MG  --   --  2.2 2.3 2.3  PHOS  --   --  3.0 3.9 3.1   GFR: Estimated Creatinine Clearance: 60.1 mL/min (by C-G formula based on SCr of 1.14 mg/dL). Liver Function Tests: Recent Labs  Lab 11/26/19 0042 11/29/19 0401 11/30/19 0230 11/30/19 2117 12/02/19 0309  AST 43* _2 ALT 52* 33 30 31 36  ALKPHOS 63 68 68 79 75  BILITOT 0.8 1.7* 1.0 0.8 0.8  PROT 6.2* 6.5 6.4* 7.0 6.8  ALBUMIN 2.9* 2.7* 2.5* 2.7* 2.8*     No results for input(s): LIPASE, AMYLASE in the last 168 hours. No results for input(s): AMMONIA in the last 168 hours. Coagulation Profile: No results for input(s): INR, PROTIME in the last 168 hours. Cardiac Enzymes: No results for input(s): CKTOTAL, CKMB, CKMBINDEX, TROPONINI in the last 168 hours. BNP (last 3 results) No results for input(s): PROBNP in the last 8760 hours. HbA1C: No results for input(s): HGBA1C in the last 72 hours. CBG: Recent Labs  Lab 11/27/19 0757 11/27/19 1200 11/27/19 1641  GLUCAP 84 117* 115*   Lipid Profile: No results for input(s): CHOL, HDL, LDLCALC, TRIG, CHOLHDL, LDLDIRECT in the last 72 hours. Thyroid Function Tests: No results for input(s): TSH, T4TOTAL, FREET4, T3FREE, THYROIDAB in the last 72 hours. Anemia Panel: No results for input(s): VITAMINB12, FOLATE, FERRITIN, TIBC, IRON, RETICCTPCT in the last 72 hours. Sepsis Labs: No results for input(s): PROCALCITON, LATICACIDVEN in the last 168 hours.  No results found for this or any previous visit (from the past 240 hour(s)).   RN Pressure Injury Documentation: Pressure Injury 11/26/19 Sacrum Mid Stage 2 -  Partial thickness loss of dermis presenting as a shallow open injury with a red, pink wound bed without slough. (Active)  11/26/19 2056  Location: Sacrum  Location Orientation: Mid  Staging: Stage 2 -  Partial thickness loss of dermis presenting as a shallow open injury with a red, pink wound bed without slough.  Wound Description (Comments):   Present on Admission:    Estimated body mass index is 30.35 kg/m as calculated from the following:   Height as of this encounter: _3  (1.702 m).   Weight as of this encounter: 87.9 kg.  Malnutrition Type:      Malnutrition Characteristics:      Nutrition Interventions:     Radiology Studies: No results found. Scheduled Meds: . vitamin C  500 mg Oral Daily  . citalopram  10 mg Oral Daily  . enoxaparin (  LOVENOX) injection   40 mg Subcutaneous Q24H  . Ipratropium-Albuterol  1 puff Inhalation Q6H  . polyethylene glycol  17 g Oral Daily  . senna  2 tablet Oral QHS  . zinc sulfate  220 mg Oral Daily   Continuous Infusions: . sodium chloride       LOS: 11 days   Kerney Elbe, DO [facility] PAGER is on AMION  If 7PM-7AM, please contact night-coverage [email or web address]

## 2019-12-02 NOTE — Progress Notes (Signed)
RT note- Patient states that in taking his inhaler(Combivent) yesterday that he had a excessive cough spell and would rather not take it this without talking to his Attending.

## 2019-12-03 LAB — MAGNESIUM: Magnesium: 2.3 mg/dL (ref 1.7–2.4)

## 2019-12-03 LAB — TSH: TSH: 0.268 u[IU]/mL — ABNORMAL LOW (ref 0.350–4.500)

## 2019-12-03 LAB — COMPREHENSIVE METABOLIC PANEL
ALT: 41 U/L (ref 0–44)
AST: 30 U/L (ref 15–41)
Albumin: 2.7 g/dL — ABNORMAL LOW (ref 3.5–5.0)
Alkaline Phosphatase: 71 U/L (ref 38–126)
Anion gap: 8 (ref 5–15)
BUN: 19 mg/dL (ref 8–23)
CO2: 27 mmol/L (ref 22–32)
Calcium: 8.9 mg/dL (ref 8.9–10.3)
Chloride: 102 mmol/L (ref 98–111)
Creatinine, Ser: 0.99 mg/dL (ref 0.61–1.24)
GFR calc Af Amer: >60 mL/min (ref >60)
GFR calc non Af Amer: >60 mL/min (ref >60)
Glucose, Bld: 125 mg/dL — ABNORMAL HIGH (ref 70–99)
Potassium: 4.9 mmol/L (ref 3.5–5.1)
Sodium: 137 mmol/L (ref 135–145)
Total Bilirubin: 0.6 mg/dL (ref 0.3–1.2)
Total Protein: 6.4 g/dL — ABNORMAL LOW (ref 6.5–8.1)

## 2019-12-03 LAB — CBC WITH DIFFERENTIAL/PLATELET
Abs Immature Granulocytes: 0.04 10*3/uL (ref 0.00–0.07)
Basophils Absolute: 0 10*3/uL (ref 0.0–0.1)
Basophils Relative: 0 %
Eosinophils Absolute: 0 10*3/uL (ref 0.0–0.5)
Eosinophils Relative: 0 %
HCT: 38.2 % — ABNORMAL LOW (ref 39.0–52.0)
Hemoglobin: 12.2 g/dL — ABNORMAL LOW (ref 13.0–17.0)
Immature Granulocytes: 1 %
Lymphocytes Relative: 8 %
Lymphs Abs: 0.6 10*3/uL — ABNORMAL LOW (ref 0.7–4.0)
MCH: 28.8 pg (ref 26.0–34.0)
MCHC: 31.9 g/dL (ref 30.0–36.0)
MCV: 90.1 fL (ref 80.0–100.0)
Monocytes Absolute: 0.5 10*3/uL (ref 0.1–1.0)
Monocytes Relative: 7 %
Neutro Abs: 5.9 10*3/uL (ref 1.7–7.7)
Neutrophils Relative %: 84 %
Platelets: 188 10*3/uL (ref 150–400)
RBC: 4.24 MIL/uL (ref 4.22–5.81)
RDW: 13 % (ref 11.5–15.5)
WBC: 7 10*3/uL (ref 4.0–10.5)
nRBC: 0 % (ref 0.0–0.2)

## 2019-12-03 LAB — LACTATE DEHYDROGENASE: LDH: 211 U/L — ABNORMAL HIGH (ref 98–192)

## 2019-12-03 LAB — D-DIMER, QUANTITATIVE: D-Dimer, Quant: 0.69 ug/mL-FEU — ABNORMAL HIGH (ref 0.00–0.50)

## 2019-12-03 LAB — SEDIMENTATION RATE: Sed Rate: 69 mm/hr — ABNORMAL HIGH (ref 0–16)

## 2019-12-03 LAB — FIBRINOGEN: Fibrinogen: 667 mg/dL — ABNORMAL HIGH (ref 210–475)

## 2019-12-03 LAB — PHOSPHORUS: Phosphorus: 2.7 mg/dL (ref 2.5–4.6)

## 2019-12-03 LAB — C-REACTIVE PROTEIN: CRP: 2 mg/dL — ABNORMAL HIGH (ref <1.0)

## 2019-12-03 MED ORDER — SODIUM CHLORIDE 0.9 % IV BOLUS
1000.0000 mL | Freq: Once | INTRAVENOUS | Status: AC
  Administered 2019-12-03: 1000 mL via INTRAVENOUS

## 2019-12-03 MED ORDER — LOSARTAN POTASSIUM 50 MG PO TABS
100.0000 mg | ORAL_TABLET | Freq: Every day | ORAL | Status: DC
  Administered 2019-12-03 – 2019-12-04 (×2): 100 mg via ORAL
  Filled 2019-12-03 (×2): qty 2

## 2019-12-03 NOTE — TOC Progression Note (Signed)
Transition of Care [facility]) - Progression Note    Patient Details  Name: [name] MRN: [id number] Date of Birth: 05-05-1945  Transition of Care [facility]) CM/SW [place],  Phone Number: 12/03/2019, 12:11 PM  Clinical Narrative:    Still no offers, pt referral faxed off further to SNF.   Expected Discharge Plan: Skilled Nursing Facility Barriers to Discharge: SNF Pending bed offer, Insurance Authorization, Continued Medical Work up  Expected Discharge Plan and Services Expected Discharge Plan: [place] arrangements for the past 2 months: Single Family Home  Readmission Risk Interventions No flowsheet data found.

## 2019-12-03 NOTE — Progress Notes (Addendum)
PROGRESS NOTE    [name]  UJW:[id number] DOB: 1946-04-02 DOA: 11/21/2019 PCP: Biagio Borg, MD    Chief Complaint  Patient presents with  . Covid Exposure  . Weakness    Brief Narrative:   74 year old with a history of HTN, depression, chronic thrombocytopenia, and CKD stage IIIa who tested positive for Covid 8/3 and presented to the ED on 8/6 with generalized weakness, cough, and confusion.  In the ED he was found to be suffering with acute hypoxia and was admitted.  Significant Events:  8/3 CTa chest no evidence of PE but noted patchy peripheral groundglass opacities 8/3 CT head no acute disease 80/3 MRI brain no evidence of infarct hemorrhage or mass 8/6 admit via [place] with metabolic encephalopathy and acute hypoxia  Date of Positive COVID Test:  8/3  Vaccination Status: Unvaccinated  COVID-19 specific Treatment: Steroid 8/6 > Remdesivir 8/6 > 8/10   Subjective:  Very weak and deconditioned He currently denies chest pain, no short of breath, he is on room air  Assessment & Plan:   Principal Problem:   Acute hypoxemic respiratory failure due to COVID-19 [facility]) Active Problems:   Essential hypertension   Depression   Thrombocytopenia (HCC)   CKD (chronic kidney disease), stage III   Acute metabolic encephalopathy   Pressure injury of skin   Confusion   COVID-19 virus infection   Hyperbilirubinemia   Hypoxia   Shortness of breath  Covid pneumonia with acute hypoxic respiratory failure -CTA no PE  - completed Remdesivir and steroid   -Hypoxia improved, inflammatory marker down trending -We will need repeat chest x-ray in the few weeks to ensure resolution of bilateral opacities on chest x-ray  Acute metabolic encephalopathy Secondary to hypoxia CT head"1. Age related cerebral atrophy, ventriculomegaly and advanced periventricular white matter disease. 2. No acute intracranial findings or mass lesions" Improving  AKI on CKD  II Creatinine 1.55 on presentation, creatinine 0.99 today  Abnormal LFT right upper quadrant ultrasound showed a 2 mm gallbladder polyp but no other acute findings Likely due to Covid infection, LFT normalized  Elevated RPR titer -Titer mildly elevated at 1: 2 but Treponema pallidum antibody negative and ID suggest this is likely a false positive RPR titer with no further work-up required  Hypertension -Home blood pressure held initially, blood pressure start to go up, resume Cozaar today   Mild chronic thrombocytopenia Platelet 188 today  Body mass index is 30.35 kg/m.   Pressure Injury 11/26/19 Sacrum Mid Stage 2 -  Partial thickness loss of dermis presenting as a shallow open injury with a red, pink wound bed without slough. (Active)  11/26/19 2056  Location: Sacrum  Location Orientation: Mid  Staging: Stage 2 -  Partial thickness loss of dermis presenting as a shallow open injury with a red, pink wound bed without slough.  Wound Description (Comments):   Present on Admission:        Deconditioning will need skilled nursing facility placement -Medically stable for discharge   DVT prophylaxis: Place TED hose Start: 12/02/19 1122 enoxaparin (LOVENOX) injection 40 mg Start: 11/22/19 1800   Code Status: Full Family Communication: Patient Disposition:   Status is: Inpatient  Dispo:  Patient From: [place]  Planned Disposition: [place]  Expected discharge date: 11/26/19  Medically stable for discharge: Yes  Consultants:   None  Procedures:   None  Antimicrobials:   None     Objective: Vitals:   12/02/19 0453 12/02/19 1113 12/02/19 1911 12/03/19  0407  BP: (!) 131/94 (!) 129/106 111/70 (!) 156/97  Pulse: 78 83 84 66  Resp: 16  18 16   Temp: 97.7 F (36.5 C)  98.2 F (36.8 C) 98.1 F (36.7 C)  TempSrc: Oral  Oral Oral  SpO2: 98% 97% 99% 96%  Weight:      Height:        Intake/Output Summary (Last 24 hours) at  12/03/2019 1143 Last data filed at 12/03/2019 1024 Gross per 24 hour  Intake 1380.62 ml  Output 100 ml  Net 1280.62 ml   Filed Weights   11/25/19 0444 11/26/19 0417 11/28/19 2252  Weight: 91 kg 91.4 kg 87.9 kg    Examination:  General exam: calm, NAD Respiratory system: Diminished at bases , no wheezing, no rales . Respiratory effort normal. Cardiovascular system: S1 & S2 heard, RRR. No JVD, no murmur, No pedal edema. Gastrointestinal system: Abdomen is nondistended, soft and nontender. No organomegaly or masses felt. Normal bowel sounds heard. Central nervous system: Alert and oriented. No focal neurological deficits. Extremities: Symmetric 5 x 5 power. Skin: No rashes, lesions or ulcers Psychiatry: Judgement and insight appear normal. Mood & affect appropriate.     Data Reviewed: I have personally reviewed following labs and imaging studies  CBC: Recent Labs  Lab 11/29/19 0401 11/30/19 0230 11/30/19 2117 12/02/19 0309 12/03/19 0433  WBC 16.6* 13.2* 11.6* 7.5 7.0  NEUTROABS  --  11.6* 10.6* 6.2 5.9  HGB 12.8* 13.1 12.9* 12.9* 12.2*  HCT 40.3 39.9 39.8 39.6 38.2*  MCV 88.8 88.9 87.9 88.8 90.1  PLT 194 173 199 176 169    Basic Metabolic Panel: Recent Labs  Lab 11/29/19 0401 11/30/19 0230 11/30/19 2117 12/02/19 0309 12/03/19 0433  NA 139 137 138 136 137  K 4.0 3.8 4.5 4.7 4.9  CL 105 103 100 99 102  CO2 25 25 28 29 27   GLUCOSE 95 119* 126* 110* 125*  BUN 18 17 22 22 19   CREATININE 1.13 1.02 1.24 1.14 0.99  CALCIUM 8.8* 8.7* 9.0 9.2 8.9  MG  --  2.2 2.3 2.3 2.3  PHOS  --  3.0 3.9 3.1 2.7    GFR: Estimated Creatinine Clearance: 69.3 mL/min (by C-G formula based on SCr of 0.99 mg/dL).  Liver Function Tests: Recent Labs  Lab 11/29/19 0401 11/30/19 0230 11/30/19 2117 12/02/19 0309 12/03/19 0433  AST 23 21 21 29 30   ALT 33 30 31 36 41  ALKPHOS 68 68 79 75 71  BILITOT 1.7* 1.0 0.8 0.8 0.6  PROT 6.5 6.4* 7.0 6.8 6.4*  ALBUMIN 2.7* 2.5* 2.7* 2.8* 2.7*     CBG: Recent Labs  Lab 11/27/19 0757 11/27/19 1200 11/27/19 1641  GLUCAP 84 117* 115*     No results found for this or any previous visit (from the past 240 hour(s)).       Radiology Studies: No results found.      Scheduled Meds: . vitamin C  500 mg Oral Daily  . citalopram  10 mg Oral Daily  . enoxaparin (LOVENOX) injection  40 mg Subcutaneous Q24H  . Ipratropium-Albuterol  1 puff Inhalation Q6H  . polyethylene glycol  17 g Oral Daily  . senna  2 tablet Oral QHS  . zinc sulfate  220 mg Oral Daily   Continuous Infusions: . sodium chloride 75 mL/hr at 12/02/19 1948  . sodium chloride       LOS: 12 days   Time spent: 47mins Greater than 50% of this time  was spent in counseling, explanation of diagnosis, planning of further management, and coordination of care.  I have personally reviewed and interpreted on  12/03/2019 daily labs, imagings as discussed above under date review session and assessment and plans.  I reviewed all nursing notes, pharmacy notes, consultant notes,  vitals, pertinent old records  I have discussed plan of care as described above with RN , patient on 12/03/2019  Voice Recognition /Dragon dictation system was used to create this note, attempts have been made to correct errors. Please contact the author with questions and/or clarifications.   Florencia Reasons, MD PhD FACP [facility]  Available via Epic secure chat 7am-7pm for nonurgent issues Please page for urgent issues To page the attending provider between 7A-7P or the covering provider during after hours 7P-7A, please log into the web site [email or web address] and access using universal [place] password for that web site. If you do not have the password, please call the hospital operator.    12/03/2019, 11:43 AM

## 2019-12-04 LAB — BASIC METABOLIC PANEL
Anion gap: 6 (ref 5–15)
BUN: 18 mg/dL (ref 8–23)
CO2: 27 mmol/L (ref 22–32)
Calcium: 8.6 mg/dL — ABNORMAL LOW (ref 8.9–10.3)
Chloride: 105 mmol/L (ref 98–111)
Creatinine, Ser: 1.01 mg/dL (ref 0.61–1.24)
GFR calc Af Amer: >60 mL/min (ref >60)
GFR calc non Af Amer: >60 mL/min (ref >60)
Glucose, Bld: 87 mg/dL (ref 70–99)
Potassium: 4 mmol/L (ref 3.5–5.1)
Sodium: 138 mmol/L (ref 135–145)

## 2019-12-04 LAB — CORTISOL: Cortisol, Plasma: 8.1 ug/dL

## 2019-12-04 MED ORDER — LOSARTAN POTASSIUM 50 MG PO TABS
50.0000 mg | ORAL_TABLET | Freq: Every day | ORAL | Status: DC
  Administered 2019-12-05 – 2019-12-08 (×4): 50 mg via ORAL
  Filled 2019-12-04 (×4): qty 1

## 2019-12-04 MED ORDER — NIFEDIPINE ER OSMOTIC RELEASE 30 MG PO TB24
30.0000 mg | ORAL_TABLET | Freq: Every day | ORAL | Status: DC
  Filled 2019-12-04: qty 1

## 2019-12-04 MED ORDER — SODIUM CHLORIDE 0.9 % IV BOLUS
500.0000 mL | Freq: Once | INTRAVENOUS | Status: AC
  Administered 2019-12-04: 500 mL via INTRAVENOUS

## 2019-12-04 NOTE — Plan of Care (Signed)
  Problem: Education: Goal: Knowledge of General Education information will improve Description Including pain rating scale, medication(s)/side effects and non-pharmacologic comfort measures Outcome: Progressing   

## 2019-12-04 NOTE — TOC Progression Note (Signed)
Transition of Care [facility]) - Progression Note    Patient Details  Name: [name] MRN: [id number] Date of Birth: May 22, 1945  Transition of Care [facility]) CM/SW [place], [place] Phone Number: 12/04/2019, 2:03 PM  Clinical Narrative:    CSW has sent referrals to further SNFs: currently barriers include insurance, no bed offer, recent COVID infection.    Expected Discharge Plan: Skilled Nursing Facility Barriers to Discharge: SNF Pending bed offer, Insurance Authorization, Continued Medical Work up  Expected Discharge Plan and Services Expected Discharge Plan: [place] arrangements for the past 2 months: Single Family Home         Social Determinants of Health (SDOH) Interventions    Readmission Risk Interventions No flowsheet data found.

## 2019-12-04 NOTE — Progress Notes (Signed)
   12/04/19 1310  Assess: MEWS Score  Temp 97.8 F (36.6 C)  BP (!) 81/50  Pulse Rate 78  Resp 12  SpO2 99 %  O2 Device Room Air  Assess: MEWS Score  MEWS Temp 0  MEWS Systolic 1  MEWS Pulse 0  MEWS RR 1  MEWS LOC 0  MEWS Score 2  MEWS Score Color Yellow  Assess: if the MEWS score is Yellow or Red  Were vital signs taken at a resting state? Yes  Focused Assessment No change from prior assessment  Early Detection of Sepsis Score *See Row Information* Low  MEWS guidelines implemented *See Row Information* Yes  Treat  Pain Score 0  Take Vital Signs  Increase Vital Sign Frequency  Yellow: Q 2hr X 2 then Q 4hr X 2, if remains yellow, continue Q 4hrs  Escalate  MEWS: Escalate Yellow: discuss with charge nurse/RN and consider discussing with provider and RRT  Notify: Charge Nurse/RN  Name of Charge Nurse/RN Notified Derald Macleod  Date Charge Nurse/RN Notified 12/04/19  Time Charge Nurse/RN Notified 1402  Notify: Provider  Provider Name/Title dr. Erlinda Hong  Date Provider Notified 12/04/19  Time Provider Notified 1330  Notification Type Face-to-face  Notification Reason Change in status  Response See new orders  Date of Provider Response 12/04/19  Time of Provider Response 1330  Document  Patient Outcome Stabilized after interventions  Progress note created (see row info) Yes

## 2019-12-04 NOTE — Progress Notes (Signed)
PROGRESS NOTE    Beth Spackman  KGY:[id number] DOB: October 14, 1945 DOA: 11/21/2019 PCP: Biagio Borg, MD    Chief Complaint  Patient presents with  . Covid Exposure  . Weakness    Brief Narrative:   74 year old with a history of HTN, depression, chronic thrombocytopenia, and CKD stage IIIa who tested positive for Covid 8/3 and presented to the ED on 8/6 with generalized weakness, cough, and confusion.  In the ED he was found to be suffering with acute hypoxia and was admitted.  Significant Events:  8/3 CTa chest no evidence of PE but noted patchy peripheral groundglass opacities 8/3 CT head no acute disease 80/3 MRI brain no evidence of infarct hemorrhage or mass 8/6 admit via  with metabolic encephalopathy and acute hypoxia  Date of Positive COVID Test:  8/3  Vaccination Status: Unvaccinated  COVID-19 specific Treatment: Steroid 8/6 > Remdesivir 8/6 > 8/10   Subjective:  Very weak and deconditioned He currently denies chest pain, no short of breath, he is on room air C/o muscle spasm, states heating pad helped He is pleasant and slightly confused about the time but able to self correct  Assessment & Plan:   Principal Problem:   Acute hypoxemic respiratory failure due to COVID-19 [facility]) Active Problems:   Essential hypertension   Depression   Thrombocytopenia (HCC)   CKD (chronic kidney disease), stage III   Acute metabolic encephalopathy   Pressure injury of skin   Confusion   COVID-19 virus infection   Hyperbilirubinemia   Hypoxia   Shortness of breath  Covid pneumonia with acute hypoxic respiratory failure -CTA no PE  - completed Remdesivir and steroid   -Hypoxia improved, inflammatory marker down trending -We will need repeat chest x-ray in the few weeks to ensure resolution of bilateral opacities on chest x-ray  Acute metabolic encephalopathy Likely Secondary to hypoxia CT head"1. Age related cerebral atrophy, ventriculomegaly and  advanced periventricular white matter disease. 2. No acute intracranial findings or mass lesions" Improving  AKI on CKD II Creatinine 1.55 on presentation, creatinine back to baseline normalized since August 18   Abnormal LFT right upper quadrant ultrasound showed a 2 mm gallbladder polyp but no other acute findings Likely due to Covid infection, LFT normalized  Elevated RPR titer -Titer mildly elevated at 1: 2 but Treponema pallidum antibody negative and ID suggest this is likely a false positive RPR titer with no further work-up required  Hypertension -Home blood pressure held initially, blood pressure start to go up, resume Cozaar on August 18, -Blood pressure fluctuating, getting low after starting Cozaar, reduce dose, continue adjust blood pressure medication  Acute on chronic thrombocytopenia Platelet on presentation was 86 , platelet 188 last checked on August 18  Body mass index is 30.35 kg/m.   Pressure ulcer present on admission Pressure Injury 11/26/19 Sacrum Mid Stage 2 -  Partial thickness loss of dermis presenting as a shallow open injury with a red, pink wound bed without slough. (Active)  11/26/19 2056  Location: Sacrum  Location Orientation: Mid  Staging: Stage 2 -  Partial thickness loss of dermis presenting as a shallow open injury with a red, pink wound bed without slough.  Wound Description (Comments):   Present on Admission:      Deconditioning will need skilled nursing facility placement -Medically stable for discharge   DVT prophylaxis: Place TED hose Start: 12/02/19 1122 enoxaparin (LOVENOX) injection 40 mg Start: 11/22/19 1800   Code Status: Full Family Communication: Patient Disposition:  Status is: Inpatient  Dispo:  Patient From: [place]  Planned Disposition: [place]  Expected discharge date: 11/26/19  Medically stable for discharge: Yes  Consultants:   None  Procedures:    None  Antimicrobials:   None     Objective: Vitals:   12/03/19 0900 12/03/19 0905 12/03/19 1946 12/04/19 0420  BP: (!) 163/128 (!) 197/175 120/80 (!) 174/100  Pulse:   76 66  Resp:   16 16  Temp:   98.7 F (37.1 C) 98.1 F (36.7 C)  TempSrc:   Oral Oral  SpO2:   97% 100%  Weight:      Height:        Intake/Output Summary (Last 24 hours) at 12/04/2019 0846 Last data filed at 12/04/2019 0705 Gross per 24 hour  Intake 3742.05 ml  Output 2425 ml  Net 1317.05 ml   Filed Weights   11/25/19 0444 11/26/19 0417 11/28/19 2252  Weight: 91 kg 91.4 kg 87.9 kg    Examination:  General exam: calm, NAD, slightly confused about the time but able to self correct Respiratory system: Diminished at bases , no wheezing, no rales . Respiratory effort normal. Cardiovascular system: S1 & S2 heard, RRR. No JVD, no murmur, No pedal edema. Gastrointestinal system: Abdomen is nondistended, soft and nontender. No organomegaly or masses felt. Normal bowel sounds heard. Central nervous system: Alert and oriented. No focal neurological deficits. Extremities: Symmetric 5 x 5 power. Skin: No rashes, lesions or ulcers Psychiatry: Judgement and insight appear normal. Mood & affect appropriate.     Data Reviewed: I have personally reviewed following labs and imaging studies  CBC: Recent Labs  Lab 11/29/19 0401 11/30/19 0230 11/30/19 2117 12/02/19 0309 12/03/19 0433  WBC 16.6* 13.2* 11.6* 7.5 7.0  NEUTROABS  --  11.6* 10.6* 6.2 5.9  HGB 12.8* 13.1 12.9* 12.9* 12.2*  HCT 40.3 39.9 39.8 39.6 38.2*  MCV 88.8 88.9 87.9 88.8 90.1  PLT 194 173 199 176 794    Basic Metabolic Panel: Recent Labs  Lab 11/30/19 0230 11/30/19 2117 12/02/19 0309 12/03/19 0433 12/04/19 0346  NA 137 138 136 137 138  K 3.8 4.5 4.7 4.9 4.0  CL 103 100 99 102 105  CO2 25 28 29 27 27   GLUCOSE 119* 126* 110* 125* 87  BUN 17 22 22 19 18   CREATININE 1.02 1.24 1.14 0.99 1.01  CALCIUM 8.7* 9.0 9.2 8.9 8.6*  MG  2.2 2.3 2.3 2.3  --   PHOS 3.0 3.9 3.1 2.7  --     GFR: Estimated Creatinine Clearance: 67.9 mL/min (by C-G formula based on SCr of 1.01 mg/dL).  Liver Function Tests: Recent Labs  Lab 11/29/19 0401 11/30/19 0230 11/30/19 2117 12/02/19 0309 12/03/19 0433  AST 23 21 21 29 30   ALT 33 30 31 36 41  ALKPHOS 68 68 79 75 71  BILITOT 1.7* 1.0 0.8 0.8 0.6  PROT 6.5 6.4* 7.0 6.8 6.4*  ALBUMIN 2.7* 2.5* 2.7* 2.8* 2.7*    CBG: Recent Labs  Lab 11/27/19 1200 11/27/19 1641  GLUCAP 117* 115*     No results found for this or any previous visit (from the past 240 hour(s)).       Radiology Studies: No results found.      Scheduled Meds: . vitamin C  500 mg Oral Daily  . citalopram  10 mg Oral Daily  . enoxaparin (LOVENOX) injection  40 mg Subcutaneous Q24H  . losartan  100 mg Oral Daily  .  NIFEdipine  30 mg Oral Daily  . polyethylene glycol  17 g Oral Daily  . senna  2 tablet Oral QHS  . zinc sulfate  220 mg Oral Daily   Continuous Infusions:    LOS: 13 days   Time spent: 60mins Greater than 50% of this time was spent in counseling, explanation of diagnosis, planning of further management, and coordination of care.  I have personally reviewed and interpreted on  12/04/2019 daily labs, imagings as discussed above under date review session and assessment and plans.  I reviewed all nursing notes, pharmacy notes, consultant notes,  vitals, pertinent old records  I have discussed plan of care as described above with RN , patient on 12/04/2019  Voice Recognition /Dragon dictation system was used to create this note, attempts have been made to correct errors. Please contact the author with questions and/or clarifications.   Florencia Reasons, MD PhD FACP [facility]  Available via Epic secure chat 7am-7pm for nonurgent issues Please page for urgent issues To page the attending provider between 7A-7P or the covering provider during after hours 7P-7A, please log into the  web site [email or web address] and access using universal [place] password for that web site. If you do not have the password, please call the hospital operator.    12/04/2019, 8:46 AM

## 2019-12-05 MED ORDER — SENNOSIDES-DOCUSATE SODIUM 8.6-50 MG PO TABS
1.0000 | ORAL_TABLET | Freq: Every day | ORAL | Status: DC
  Administered 2019-12-05 – 2019-12-08 (×4): 1 via ORAL
  Filled 2019-12-05 (×4): qty 1

## 2019-12-05 NOTE — Consult Note (Signed)
[place] Nurse Consult Note: Reason for Consult:Recent Covid exposure and deconditioning.  Decreased bed mobility and has developed stage 2 skin injury. Awaiting SNF placement.  Complicated by recent COVID infection.  Wound type:stage 2 pressure injury.  Pressure Injury POA: Yes Measurement: 2 cm x 1 cm x 0.2 cm  Wound HZJ:GJGM and moist Drainage (amount, consistency, odor) minimal serosanguinous  No odor.  Periwound:intact Dressing procedure/placement/frequency: cleanse sacral wound with NS and pat dry.  Apply alginate dressing to wound bed. Cover with sacral foam.  Change every three days and PRN soilage.  Will not follow at this time.  Please re-consult if needed.  Domenic Moras MSN, RN, FNP-BC CWON Wound, Ostomy, Continence Nurse Pager [phone]

## 2019-12-05 NOTE — Plan of Care (Signed)
  Problem: Education: Goal: Knowledge of General Education information will improve Description Including pain rating scale, medication(s)/side effects and non-pharmacologic comfort measures Outcome: Progressing   

## 2019-12-05 NOTE — Progress Notes (Signed)
PROGRESS NOTE    [name]  GUR:[id number] DOB: 08-19-1945 DOA: 11/21/2019 PCP: Biagio Borg, MD    Chief Complaint  Patient presents with  . Covid Exposure  . Weakness    Brief Narrative:   74 year old with a history of HTN, depression, chronic thrombocytopenia, and CKD stage IIIa who tested positive for Covid 8/3 and presented to the ED on 8/6 with generalized weakness, cough, and confusion.  In the ED he was found to be suffering with acute hypoxia and was admitted.  Significant Events:  8/3 CTa chest no evidence of PE but noted patchy peripheral groundglass opacities 8/3 CT head no acute disease 80/3 MRI brain no evidence of infarct hemorrhage or mass 8/6 admit via [place] with metabolic encephalopathy and acute hypoxia  Date of Positive COVID Test:  8/3  Vaccination Status: Unvaccinated  COVID-19 specific Treatment: Steroid 8/6 > Remdesivir 8/6 > 8/10   Subjective:  Appears stronger, reports shoulder spasm has resolved, he is sitting up in chair He currently denies chest pain, no short of breath, he is on room air  He is pleasant and slightly confused about the month , states it is September  Assessment & Plan:   Principal Problem:   Acute hypoxemic respiratory failure due to COVID-19 [facility]) Active Problems:   Essential hypertension   Depression   Thrombocytopenia (HCC)   CKD (chronic kidney disease), stage III   Acute metabolic encephalopathy   Pressure injury of skin   Confusion   COVID-19 virus infection   Hyperbilirubinemia   Hypoxia   Shortness of breath  Covid pneumonia with acute hypoxic respiratory failure -CTA no PE  - completed Remdesivir and steroid   -Hypoxia improved, inflammatory marker down trending -We will need repeat chest x-ray in the few weeks to ensure resolution of bilateral opacities on chest x-ray  Acute metabolic encephalopathy Likely Secondary to hypoxia CT head"1. Age related cerebral atrophy,  ventriculomegaly and advanced periventricular white matter disease. 2. No acute intracranial findings or mass lesions" Improving  AKI on CKD II Creatinine 1.55 on presentation, creatinine back to baseline normalized since August 18   Abnormal LFT right upper quadrant ultrasound showed a 2 mm gallbladder polyp but no other acute findings Likely due to Covid infection, LFT normalized  Elevated RPR titer -Titer mildly elevated at 1: 2 but Treponema pallidum antibody negative and ID suggest this is likely a false positive RPR titer with no further work-up required  Hypertension -Home blood pressure held initially, blood pressure start to go up, resume Cozaar on August 18, -Blood pressure fluctuating, getting low after starting Cozaar, reduce dose, continue adjust blood pressure medication -BP stable on reduced dose of Cozaar  Acute on chronic thrombocytopenia Platelet on presentation was 86 , platelet 188 last checked on August 18  Body mass index is 30.35 kg/m.   Pressure ulcer not present on admission, first identified on 8/11 Pressure Injury 11/26/19 Sacrum Mid Stage 2 -  Partial thickness loss of dermis presenting as a shallow open injury with a red, pink wound bed without slough. (Active)  11/26/19 2056  Location: Sacrum  Location Orientation: Mid  Staging: Stage 2 -  Partial thickness loss of dermis presenting as a shallow open injury with a red, pink wound bed without slough.  Wound Description (Comments):   Present on Admission:   [place] Nurse Consult Note: Reason for Consult:Recent Covid exposure and deconditioning.  Decreased bed mobility and has developed stage 2 skin injury. Awaiting SNF placement.  Complicated by  recent COVID infection.  Wound type:stage 2 pressure injury.  Pressure Injury POA: Yes Measurement: 2 cm x 1 cm x 0.2 cm  Wound ERX:VQMG and moist Drainage (amount, consistency, odor) minimal serosanguinous  No odor.  Periwound:intact Dressing  procedure/placement/frequency: cleanse sacral wound with NS and pat dry.  Apply alginate dressing to wound bed. Cover with sacral foam.  Change every three days and PRN soilage.    Deconditioning will need skilled nursing facility placement -Medically stable for discharge   DVT prophylaxis: Place TED hose Start: 12/02/19 1122 enoxaparin (LOVENOX) injection 40 mg Start: 11/22/19 1800   Code Status: Full Family Communication: Patient Disposition:   Status is: Inpatient  Dispo:  Patient From: [place]  Planned Disposition: [place]  Expected discharge date: 11/26/19  Medically stable for discharge: Yes  Consultants:   None  Procedures:   None  Antimicrobials:   None     Objective: Vitals:   12/04/19 1710 12/04/19 1957 12/04/19 2329 12/05/19 0448  BP: 131/82 112/74 126/89 129/87  Pulse: 69 75 62 69  Resp: 18 18 18 18   Temp: 98.9 F (37.2 C) 98.3 F (36.8 C) 98.5 F (36.9 C) 97.8 F (36.6 C)  TempSrc: Oral Oral Oral Oral  SpO2: 99% 96% 98% 97%  Weight:      Height:        Intake/Output Summary (Last 24 hours) at 12/05/2019 1346 Last data filed at 12/05/2019 0742 Gross per 24 hour  Intake 240 ml  Output 1250 ml  Net -1010 ml   Filed Weights   11/25/19 0444 11/26/19 0417 11/28/19 2252  Weight: 91 kg 91.4 kg 87.9 kg    Examination:  General exam: calm, NAD, slightly confused about the month Respiratory system: Diminished at bases , no wheezing, no rales . Respiratory effort normal. Cardiovascular system: S1 & S2 heard, RRR. No JVD, no murmur, No pedal edema. Gastrointestinal system: Abdomen is nondistended, soft and nontender. No organomegaly or masses felt. Normal bowel sounds heard. Central nervous system: Alert . No focal neurological deficits. Extremities: Symmetric 5 x 5 power. Skin: No rashes, lesions or ulcers Psychiatry: Judgement and insight appear normal. Mood & affect appropriate.     Data Reviewed: I have  personally reviewed following labs and imaging studies  CBC: Recent Labs  Lab 11/29/19 0401 11/30/19 0230 11/30/19 2117 12/02/19 0309 12/03/19 0433  WBC 16.6* 13.2* 11.6* 7.5 7.0  NEUTROABS  --  11.6* 10.6* 6.2 5.9  HGB 12.8* 13.1 12.9* 12.9* 12.2*  HCT 40.3 39.9 39.8 39.6 38.2*  MCV 88.8 88.9 87.9 88.8 90.1  PLT 194 173 199 176 867    Basic Metabolic Panel: Recent Labs  Lab 11/30/19 0230 11/30/19 2117 12/02/19 0309 12/03/19 0433 12/04/19 0346  NA 137 138 136 137 138  K 3.8 4.5 4.7 4.9 4.0  CL 103 100 99 102 105  CO2 25 28 29 27 27   GLUCOSE 119* 126* 110* 125* 87  BUN 17 22 22 19 18   CREATININE 1.02 1.24 1.14 0.99 1.01  CALCIUM 8.7* 9.0 9.2 8.9 8.6*  MG 2.2 2.3 2.3 2.3  --   PHOS 3.0 3.9 3.1 2.7  --     GFR: Estimated Creatinine Clearance: 67.9 mL/min (by C-G formula based on SCr of 1.01 mg/dL).  Liver Function Tests: Recent Labs  Lab 11/29/19 0401 11/30/19 0230 11/30/19 2117 12/02/19 0309 12/03/19 0433  AST 23 21 21 29 30   ALT 33 30 31 36 41  ALKPHOS 68 68 79 75  71  BILITOT 1.7* 1.0 0.8 0.8 0.6  PROT 6.5 6.4* 7.0 6.8 6.4*  ALBUMIN 2.7* 2.5* 2.7* 2.8* 2.7*    CBG: No results for input(s): GLUCAP in the last 168 hours.   No results found for this or any previous visit (from the past 240 hour(s)).       Radiology Studies: No results found.      Scheduled Meds: . vitamin C  500 mg Oral Daily  . citalopram  10 mg Oral Daily  . enoxaparin (LOVENOX) injection  40 mg Subcutaneous Q24H  . losartan  50 mg Oral Daily  . polyethylene glycol  17 g Oral Daily  . senna  2 tablet Oral QHS  . zinc sulfate  220 mg Oral Daily   Continuous Infusions:    LOS: 14 days   Time spent: 38mins Greater than 50% of this time was spent in counseling, explanation of diagnosis, planning of further management, and coordination of care.  I have personally reviewed and interpreted on  12/05/2019 daily labs, imagings as discussed above under date review session  and assessment and plans.  I reviewed all nursing notes, pharmacy notes, consultant notes,  vitals, pertinent old records  I have discussed plan of care as described above with RN , patient on 12/05/2019  Voice Recognition /Dragon dictation system was used to create this note, attempts have been made to correct errors. Please contact the author with questions and/or clarifications.   Florencia Reasons, MD PhD FACP [facility]  Available via Epic secure chat 7am-7pm for nonurgent issues Please page for urgent issues To page the attending provider between 7A-7P or the covering provider during after hours 7P-7A, please log into the web site [email or web address] and access using universal [place] password for that web site. If you do not have the password, please call the hospital operator.    12/05/2019, 1:46 PM

## 2019-12-05 NOTE — Progress Notes (Signed)
Physical Therapy Treatment Patient Details Name: Yaseen Gilberg MRN: [id number] DOB: 18-Apr-1945 Today's Date: 12/05/2019    History of Present Illness Pt is a 74 y.o. male presenting 11/21/19 with productive cough, AMS, and weakness of 1 week in duration. Head CT and MRI clear. Negative for PE. Found to be Covid positive and admitted for acute hypoxemic respiratory failure due to Covid infection. PMH includes HTN, PTSD.    PT Comments    Patient received in bed, alert, slow to respond. Agrees to PT session. He performed bed mobility with mod independence. Transfers with min assist and cues for hand placement. He is able to ambulate 25 feet in room with RW and min guard assist. Occasional verbal and tactile cues for direction and obstacle avoidance. He will continue to benefit from skilled PT while here to improve functional independence and safety with mobility.      Follow Up Recommendations  SNF;Other (comment);Supervision/Assistance - 24 hour (if his brother or other family could assist at home, he could possibly go home. Just seems mostly limited by cognition at this time.)     Equipment Recommendations  Rolling walker with 5" wheels    Recommendations for Other Services       Precautions / Restrictions Precautions Precautions: Fall    Mobility  Bed Mobility Overal bed mobility: Modified Independent Bed Mobility: Supine to Sit     Supine to sit: Modified independent (Device/Increase time)        Transfers Overall transfer level: Needs assistance Equipment used: Rolling walker (2 wheeled) Transfers: Sit to/from Stand Sit to Stand: Min guard         General transfer comment: min assist. Cues for hand placement.  Ambulation/Gait Ambulation/Gait assistance: Min guard Gait Distance (Feet): 25 Feet Assistive device: Rolling walker (2 wheeled) Gait Pattern/deviations: Step-to pattern;Decreased stride length Gait velocity: Decreased   General Gait Details: patient  requires cues for direction, staying on task. Slow with mobility. Reports feeling tightness in shoulders. No significant fatigue/difficulty reported. Squinting eyes while walking.   Stairs             Wheelchair Mobility    Modified Rankin (Stroke Patients Only)       Balance Overall balance assessment: Needs assistance Sitting-balance support: Feet supported Sitting balance-Leahy Scale: Good     Standing balance support: Bilateral upper extremity supported;During functional activity Standing balance-Leahy Scale: Good Standing balance comment: limited reliance on RW, just seems confused when moving about. Not so much unsteady.                            Cognition Arousal/Alertness: Awake/alert Behavior During Therapy: Flat affect Overall Cognitive Status: No family/caregiver present to determine baseline cognitive functioning Area of Impairment: Orientation;Awareness;Attention;Following commands;Safety/judgement;Problem solving                 Orientation Level: Disoriented to;Time Current Attention Level: Sustained Memory: Decreased recall of precautions;Decreased short-term memory Following Commands: Follows one step commands with increased time Safety/Judgement: Decreased awareness of safety;Decreased awareness of deficits Awareness: Intellectual Problem Solving: Slow processing;Decreased initiation;Difficulty sequencing;Requires verbal cues;Requires tactile cues General Comments: pt continues to be slow to follow commands and participate in conversation. Seems new for him as he was working as a [employer]ar[employer] manager in [employer] Comments        Pertinent Vitals/Pain Pain Assessment: No/denies pain    Home Living Family/patient  expects to be discharged to:: Skilled nursing facility Living Arrangements: Other relatives Available Help at Discharge: Available PRN/intermittently;Family                Prior  Function            PT Goals (current goals can now be found in the care plan section) Acute Rehab PT Goals Patient Stated Goal: return home PT Goal Formulation: Patient unable to participate in goal setting Time For Goal Achievement: 12/13/19 Potential to Achieve Goals: Fair Progress towards PT goals: Progressing toward goals    Frequency    Min 2X/week      PT Plan Current plan remains appropriate    Co-evaluation              AM-PAC PT "6 Clicks" Mobility   Outcome Measure  Help needed turning from your back to your side while in a flat bed without using bedrails?: None Help needed moving from lying on your back to sitting on the side of a flat bed without using bedrails?: None Help needed moving to and from a bed to a chair (including a wheelchair)?: A Little Help needed standing up from a chair using your arms (e.g., wheelchair or bedside chair)?: A Little Help needed to walk in hospital room?: A Little Help needed climbing 3-5 steps with a railing? : A Little 6 Click Score: 20    End of Session Equipment Utilized During Treatment: Gait belt Activity Tolerance: Patient tolerated treatment well Patient left: in chair;with chair alarm set;with call bell/phone within reach Nurse Communication: Mobility status PT Visit Diagnosis: Other abnormalities of gait and mobility (R26.89)     Time: 1100-1127 PT Time Calculation (min) (ACUTE ONLY): 27 min  Charges:  $Gait Training: 23-37 mins                     Kazuki Ingle, PT, GCS 12/05/19,11:51 AM

## 2019-12-06 LAB — MAGNESIUM: Magnesium: 2.3 mg/dL (ref 1.7–2.4)

## 2019-12-06 LAB — BASIC METABOLIC PANEL
Anion gap: 6 (ref 5–15)
BUN: 15 mg/dL (ref 8–23)
CO2: 27 mmol/L (ref 22–32)
Calcium: 8.4 mg/dL — ABNORMAL LOW (ref 8.9–10.3)
Chloride: 105 mmol/L (ref 98–111)
Creatinine, Ser: 1.07 mg/dL (ref 0.61–1.24)
GFR calc Af Amer: >60 mL/min (ref >60)
GFR calc non Af Amer: >60 mL/min (ref >60)
Glucose, Bld: 97 mg/dL (ref 70–99)
Potassium: 4 mmol/L (ref 3.5–5.1)
Sodium: 138 mmol/L (ref 135–145)

## 2019-12-06 LAB — CBC WITH DIFFERENTIAL/PLATELET
Abs Immature Granulocytes: 0.04 10*3/uL (ref 0.00–0.07)
Basophils Absolute: 0 10*3/uL (ref 0.0–0.1)
Basophils Relative: 0 %
Eosinophils Absolute: 0 10*3/uL (ref 0.0–0.5)
Eosinophils Relative: 1 %
HCT: 34.4 % — ABNORMAL LOW (ref 39.0–52.0)
Hemoglobin: 11 g/dL — ABNORMAL LOW (ref 13.0–17.0)
Immature Granulocytes: 1 %
Lymphocytes Relative: 20 %
Lymphs Abs: 1 10*3/uL (ref 0.7–4.0)
MCH: 28.2 pg (ref 26.0–34.0)
MCHC: 32 g/dL (ref 30.0–36.0)
MCV: 88.2 fL (ref 80.0–100.0)
Monocytes Absolute: 0.6 10*3/uL (ref 0.1–1.0)
Monocytes Relative: 11 %
Neutro Abs: 3.3 10*3/uL (ref 1.7–7.7)
Neutrophils Relative %: 67 %
Platelets: 149 10*3/uL — ABNORMAL LOW (ref 150–400)
RBC: 3.9 MIL/uL — ABNORMAL LOW (ref 4.22–5.81)
RDW: 13.1 % (ref 11.5–15.5)
WBC: 5 10*3/uL (ref 4.0–10.5)
nRBC: 0 % (ref 0.0–0.2)

## 2019-12-06 LAB — C-REACTIVE PROTEIN: CRP: 1.1 mg/dL — ABNORMAL HIGH (ref <1.0)

## 2019-12-06 LAB — SEDIMENTATION RATE: Sed Rate: 45 mm/hr — ABNORMAL HIGH (ref 0–16)

## 2019-12-06 NOTE — Progress Notes (Signed)
Pressure redistribution chair pad ordered for pt for when he is up in the chair and to take home with him.

## 2019-12-06 NOTE — Progress Notes (Signed)
PROGRESS NOTE    [name]  IHW:[id number] DOB: 1946-02-12 DOA: 11/21/2019 PCP: Biagio Borg, MD    Chief Complaint  Patient presents with  . Covid Exposure  . Weakness    Brief Narrative:   74 year old with a history of HTN, depression, chronic thrombocytopenia, and CKD stage IIIa who tested positive for Covid 8/3 and presented to the ED on 8/6 with generalized weakness, cough, and confusion.  In the ED he was found to be suffering with acute hypoxia and was admitted.  Significant Events:  8/3 CTa chest no evidence of PE but noted patchy peripheral groundglass opacities 8/3 CT head no acute disease 80/3 MRI brain no evidence of infarct hemorrhage or mass 8/6 admit via [place] with metabolic encephalopathy and acute hypoxia  Date of Positive COVID Test:  8/3  Vaccination Status: Unvaccinated  COVID-19 specific Treatment: Steroid 8/6 > Remdesivir 8/6 > 8/10   Subjective:  Uneventful night,  He currently denies chest pain, no short of breath, he is on room air  He is pleasant and slightly confused about the month , states it is September  Assessment & Plan:   Principal Problem:   Acute hypoxemic respiratory failure due to COVID-19 [facility]) Active Problems:   Essential hypertension   Depression   Thrombocytopenia (HCC)   CKD (chronic kidney disease), stage III   Acute metabolic encephalopathy   Pressure injury of skin   Confusion   COVID-19 virus infection   Hyperbilirubinemia   Hypoxia   Shortness of breath  Covid pneumonia with acute hypoxic respiratory failure -CTA no PE  - completed Remdesivir and steroid   -Hypoxia improved, inflammatory marker down trending -We will need repeat chest x-ray in the few weeks to ensure resolution of bilateral opacities on chest x-ray  Acute metabolic encephalopathy Likely Secondary to hypoxia CT head"1. Age related cerebral atrophy, ventriculomegaly and advanced periventricular white matter disease. 2. No  acute intracranial findings or mass lesions" Improving  AKI on CKD II Creatinine 1.55 on presentation, creatinine back to baseline normalized since August 18   Abnormal LFT right upper quadrant ultrasound showed a 2 mm gallbladder polyp but no other acute findings Likely due to Covid infection, LFT normalized  Elevated RPR titer -Titer mildly elevated at 1: 2 but Treponema pallidum antibody negative and ID suggest this is likely a false positive RPR titer with no further work-up required  Hypertension -Home blood pressure held initially, blood pressure start to go up, resume Cozaar on August 18, -Blood pressure fluctuating, getting low after starting Cozaar, reduce dose, continue adjust blood pressure medication -BP stable on reduced dose of Cozaar  Acute on chronic thrombocytopenia Platelet on presentation was 86 , platelet 188 last checked on August 18  Body mass index is 30.35 kg/m.   Pressure ulcer not present on admission, first identified on 8/11 Pressure Injury 11/26/19 Sacrum Mid Stage 2 -  Partial thickness loss of dermis presenting as a shallow open injury with a red, pink wound bed without slough. (Active)  11/26/19 2056  Location: Sacrum  Location Orientation: Mid  Staging: Stage 2 -  Partial thickness loss of dermis presenting as a shallow open injury with a red, pink wound bed without slough.  Wound Description (Comments):   Present on Admission:   [place] Nurse Consult Note: Reason for Consult:Recent Covid exposure and deconditioning.  Decreased bed mobility and has developed stage 2 skin injury. Awaiting SNF placement.  Complicated by recent COVID infection.  Wound type:stage 2 pressure injury.  Pressure Injury POA: Yes Measurement: 2 cm x 1 cm x 0.2 cm  Wound ZOX:WRUE and moist Drainage (amount, consistency, odor) minimal serosanguinous  No odor.  Periwound:intact Dressing procedure/placement/frequency: cleanse sacral wound with NS and pat dry.  Apply alginate  dressing to wound bed. Cover with sacral foam.  Change every three days and PRN soilage.    Deconditioning will need skilled nursing facility placement -Medically stable for discharge   DVT prophylaxis: Place TED hose Start: 12/02/19 1122 enoxaparin (LOVENOX) injection 40 mg Start: 11/22/19 1800   Code Status: Full Family Communication: Patient Disposition:   Status is: Inpatient  Dispo:  Patient From: [place]  Planned Disposition: [place]  Expected discharge date: 11/26/19  Medically stable for discharge: Yes  Consultants:   None  Procedures:   None  Antimicrobials:   None     Objective: Vitals:   12/05/19 0448 12/05/19 1422 12/05/19 2028 12/06/19 0600  BP: 129/87 98/66 112/79 (!) 153/95  Pulse: 69 74 68 63  Resp: 18 18 16 16   Temp: 97.8 F (36.6 C) 98.4 F (36.9 C) 98.6 F (37 C) 99 F (37.2 C)  TempSrc: Oral Oral Oral Oral  SpO2: 97% 100% 99% 98%  Weight:      Height:        Intake/Output Summary (Last 24 hours) at 12/06/2019 0922 Last data filed at 12/06/2019 0758 Gross per 24 hour  Intake 460 ml  Output 625 ml  Net -165 ml   Filed Weights   11/25/19 0444 11/26/19 0417 11/28/19 2252  Weight: 91 kg 91.4 kg 87.9 kg    Examination:  General exam: calm, NAD, slightly confused about the month Respiratory system: Diminished at bases , no wheezing, no rales . Respiratory effort normal. Cardiovascular system: S1 & S2 heard, RRR. No JVD, no murmur, No pedal edema. Gastrointestinal system: Abdomen is nondistended, soft and nontender. No organomegaly or masses felt. Normal bowel sounds heard. Central nervous system: Alert . No focal neurological deficits. Extremities: Symmetric 5 x 5 power. Skin: No rashes, lesions or ulcers Psychiatry: Judgement and insight appear normal. Mood & affect appropriate.     Data Reviewed: I have personally reviewed following labs and imaging studies  CBC: Recent Labs  Lab  11/30/19 0230 11/30/19 2117 12/02/19 0309 12/03/19 0433 12/06/19 0428  WBC 13.2* 11.6* 7.5 7.0 5.0  NEUTROABS 11.6* 10.6* 6.2 5.9 3.3  HGB 13.1 12.9* 12.9* 12.2* 11.0*  HCT 39.9 39.8 39.6 38.2* 34.4*  MCV 88.9 87.9 88.8 90.1 88.2  PLT 173 199 176 188 149*    Basic Metabolic Panel: Recent Labs  Lab 11/30/19 0230 11/30/19 0230 11/30/19 2117 12/02/19 0309 12/03/19 0433 12/04/19 0346 12/06/19 0428  NA 137   < > 138 136 137 138 138  K 3.8   < > 4.5 4.7 4.9 4.0 4.0  CL 103   < > 100 99 102 105 105  CO2 25   < > 28 29 27 27 27   GLUCOSE 119*   < > 126* 110* 125* 87 97  BUN 17   < > 22 22 19 18 15   CREATININE 1.02   < > 1.24 1.14 0.99 1.01 1.07  CALCIUM 8.7*   < > 9.0 9.2 8.9 8.6* 8.4*  MG 2.2  --  2.3 2.3 2.3  --  2.3  PHOS 3.0  --  3.9 3.1 2.7  --   --    < > = values in this interval not displayed.  GFR: Estimated Creatinine Clearance: 64.1 mL/min (by C-G formula based on SCr of 1.07 mg/dL).  Liver Function Tests: Recent Labs  Lab 11/30/19 0230 11/30/19 2117 12/02/19 0309 12/03/19 0433  AST 21 21 29 30   ALT 30 31 36 41  ALKPHOS 68 79 75 71  BILITOT 1.0 0.8 0.8 0.6  PROT 6.4* 7.0 6.8 6.4*  ALBUMIN 2.5* 2.7* 2.8* 2.7*    CBG: No results for input(s): GLUCAP in the last 168 hours.   No results found for this or any previous visit (from the past 240 hour(s)).       Radiology Studies: No results found.      Scheduled Meds: . vitamin C  500 mg Oral Daily  . citalopram  10 mg Oral Daily  . enoxaparin (LOVENOX) injection  40 mg Subcutaneous Q24H  . losartan  50 mg Oral Daily  . polyethylene glycol  17 g Oral Daily  . senna-docusate  1 tablet Oral QHS  . zinc sulfate  220 mg Oral Daily   Continuous Infusions:    LOS: 15 days   Time spent: 51mins Greater than 50% of this time was spent in counseling, explanation of diagnosis, planning of further management, and coordination of care.  I have personally reviewed and interpreted on   12/06/2019 daily labs, imagings as discussed above under date review session and assessment and plans.  I reviewed all nursing notes, pharmacy notes, consultant notes,  vitals, pertinent old records  I have discussed plan of care as described above with RN , patient on 12/06/2019  Voice Recognition /Dragon dictation system was used to create this note, attempts have been made to correct errors. Please contact the author with questions and/or clarifications.   Florencia Reasons, MD PhD FACP [facility]  Available via Epic secure chat 7am-7pm for nonurgent issues Please page for urgent issues To page the attending provider between 7A-7P or the covering provider during after hours 7P-7A, please log into the web site [email or web address] and access using universal  password for that web site. If you do not have the password, please call the hospital operator.    12/06/2019, 9:22 AM

## 2019-12-07 MED ORDER — SODIUM CHLORIDE 0.9 % IV SOLN: INTRAVENOUS | Status: AC

## 2019-12-07 MED ORDER — SODIUM CHLORIDE 0.9 % IV BOLUS
1000.0000 mL | Freq: Once | INTRAVENOUS | Status: AC
  Administered 2019-12-07: 1000 mL via INTRAVENOUS

## 2019-12-07 NOTE — Progress Notes (Addendum)
PROGRESS NOTE    [name]  WUJ:[id number] DOB: [birth year] DOA: 11/21/2019 PCP: Biagio Borg, MD    Chief Complaint  Patient presents with   Covid Exposure   Weakness    Brief Narrative:   74 year old with a history of HTN, depression, chronic thrombocytopenia, and CKD stage IIIa who tested positive for Covid 8/3 and presented to the ED on 8/6 with generalized weakness, cough, and confusion.  In the ED he was found to be suffering with acute hypoxia and was admitted.  Significant Events:  8/3 CTa chest no evidence of PE but noted patchy peripheral groundglass opacities 8/3 CT head no acute disease 80/3 MRI brain no evidence of infarct hemorrhage or mass 8/6 admit via [place] with metabolic encephalopathy and acute hypoxia  Date of Positive COVID Test:  8/3  Vaccination Status: Unvaccinated  COVID-19 specific Treatment: Steroid 8/6 > Remdesivir 8/6 > 8/10   Subjective:  Uneventful night,  He currently denies chest pain, no short of breath, he is on room air  He is pleasant and slightly confused about the month , he has very poor short term memory  Assessment & Plan:   Principal Problem:   Acute hypoxemic respiratory failure due to COVID-19 [facility]) Active Problems:   Essential hypertension   Depression   Thrombocytopenia (HCC)   CKD (chronic kidney disease), stage III   Acute metabolic encephalopathy   Pressure injury of skin   Confusion   COVID-19 virus infection   Hyperbilirubinemia   Hypoxia   Shortness of breath   6:40pm addendum: patient has a presyncopal episode while using the toilet, he is orthostatic, ns bolus one liter ordered, start gentle hydration with ns at 75cc /hr for 10 hrs, repeat orthostatic vital sign in am,  Am cortisol level checked on 8/19 was 8  Covid pneumonia with acute hypoxic respiratory failure -CTA no PE  - completed Remdesivir and steroid   -Hypoxia improved, inflammatory marker down trending -We will need  repeat chest x-ray in the few weeks to ensure resolution of bilateral opacities on chest x-ray  Acute metabolic encephalopathy Likely Secondary to hypoxia CT head"1. Age related cerebral atrophy, ventriculomegaly and advanced periventricular white matter disease. 2. No acute intracranial findings or mass lesions" Improving, confused about a month states is September, has very poor short-term memory  AKI on CKD II Creatinine 1.55 on presentation, creatinine back to baseline normalized since August 18   Abnormal LFT right upper quadrant ultrasound showed a 2 mm gallbladder polyp but no other acute findings Likely due to Covid infection, LFT normalized  Elevated RPR titer -Titer mildly elevated at 1: 2 but Treponema pallidum antibody negative and ID suggest this is likely a false positive RPR titer with no further work-up required  Hypertension -Home blood pressure held initially, blood pressure start to go up, resume Cozaar on August 18, -Blood pressure fluctuating, getting low after starting Cozaar, reduce dose, continue adjust blood pressure medication -BP stable on reduced dose of Cozaar  Acute on chronic thrombocytopenia Platelet on presentation was 86 , platelet 188 last checked on August 18  Body mass index is 30.35 kg/m.   Pressure ulcer not present on admission, first identified on 8/11 Pressure Injury 11/26/19 Sacrum Mid Stage 2 -  Partial thickness loss of dermis presenting as a shallow open injury with a red, pink wound bed without slough. (Active)  11/26/19 2056  Location: Sacrum  Location Orientation: Mid  Staging: Stage 2 -  Partial thickness loss of dermis presenting  as a shallow open injury with a red, pink wound bed without slough.  Wound Description (Comments):   Present on Admission:   [place] Nurse Consult Note: Reason for Consult:Recent Covid exposure and deconditioning.  Decreased bed mobility and has developed stage 2 skin injury. Awaiting SNF placement.   Complicated by recent COVID infection.  Wound type:stage 2 pressure injury.  Pressure Injury POA: Yes Measurement: 2 cm x 1 cm x 0.2 cm  Wound EVO:JJKK and moist Drainage (amount, consistency, odor) minimal serosanguinous  No odor.  Periwound:intact Dressing procedure/placement/frequency: cleanse sacral wound with NS and pat dry.  Apply alginate dressing to wound bed. Cover with sacral foam.  Change every three days and PRN soilage.    Deconditioning will need skilled nursing facility placement -Medically stable for discharge   DVT prophylaxis: Place TED hose Start: 12/02/19 1122 enoxaparin (LOVENOX) injection 40 mg Start: 11/22/19 1800   Code Status: Full Family Communication: Called brother sidney and  left a message Disposition:   Status is: Inpatient  Dispo:  Patient From: [place]  Planned Disposition: [place]  Expected discharge date: 11/26/19  Medically stable for discharge: Yes  Consultants:   None  Procedures:   None  Antimicrobials:   None     Objective: Vitals:   12/06/19 0600 12/06/19 1412 12/06/19 2019 12/07/19 0500  BP: (!) 153/95 (!) 146/86 107/74 131/88  Pulse: 63 66 67 66  Resp: 16 18 17 16   Temp: 99 F (37.2 C) 98.3 F (36.8 C) 98.2 F (36.8 C) 98.2 F (36.8 C)  TempSrc: Oral Oral Oral Oral  SpO2: 98% 98% 94% 96%  Weight:      Height:        Intake/Output Summary (Last 24 hours) at 12/07/2019 1219 Last data filed at 12/07/2019 0930 Gross per 24 hour  Intake 540 ml  Output 1200 ml  Net -660 ml   Filed Weights   11/25/19 0444 11/26/19 0417 11/28/19 2252  Weight: 91 kg 91.4 kg 87.9 kg    Examination:  General exam: calm, NAD, slightly confused about the month Respiratory system: Diminished at bases , no wheezing, no rales . Respiratory effort normal. Cardiovascular system: S1 & S2 heard, RRR. No JVD, no murmur, No pedal edema. Gastrointestinal system: Abdomen is nondistended, soft and  nontender. No organomegaly or masses felt. Normal bowel sounds heard. Central nervous system: Alert . No focal neurological deficits. Extremities: Symmetric 5 x 5 power. Skin: No rashes, lesions or ulcers Psychiatry: Judgement and insight appear normal. Mood & affect appropriate.     Data Reviewed: I have personally reviewed following labs and imaging studies  CBC: Recent Labs  Lab 11/30/19 2117 12/02/19 0309 12/03/19 0433 12/06/19 0428  WBC 11.6* 7.5 7.0 5.0  NEUTROABS 10.6* 6.2 5.9 3.3  HGB 12.9* 12.9* 12.2* 11.0*  HCT 39.8 39.6 38.2* 34.4*  MCV 87.9 88.8 90.1 88.2  PLT 199 176 188 149*    Basic Metabolic Panel: Recent Labs  Lab 11/30/19 2117 12/02/19 0309 12/03/19 0433 12/04/19 0346 12/06/19 0428  NA 138 136 137 138 138  K 4.5 4.7 4.9 4.0 4.0  CL 100 99 102 105 105  CO2 28 29 27 27 27   GLUCOSE 126* 110* 125* 87 97  BUN 22 22 19 18 15   CREATININE 1.24 1.14 0.99 1.01 1.07  CALCIUM 9.0 9.2 8.9 8.6* 8.4*  MG 2.3 2.3 2.3  --  2.3  PHOS 3.9 3.1 2.7  --   --  GFR: Estimated Creatinine Clearance: 64.1 mL/min (by C-G formula based on SCr of 1.07 mg/dL).  Liver Function Tests: Recent Labs  Lab 11/30/19 2117 12/02/19 0309 12/03/19 0433  AST 21 29 30   ALT 31 36 41  ALKPHOS 79 75 71  BILITOT 0.8 0.8 0.6  PROT 7.0 6.8 6.4*  ALBUMIN 2.7* 2.8* 2.7*    CBG: No results for input(s): GLUCAP in the last 168 hours.   No results found for this or any previous visit (from the past 240 hour(s)).       Radiology Studies: No results found.      Scheduled Meds:  vitamin C  500 mg Oral Daily   citalopram  10 mg Oral Daily   enoxaparin (LOVENOX) injection  40 mg Subcutaneous Q24H   losartan  50 mg Oral Daily   polyethylene glycol  17 g Oral Daily   senna-docusate  1 tablet Oral QHS   zinc sulfate  220 mg Oral Daily   Continuous Infusions:    LOS: 16 days   Time spent: 59mins Greater than 50% of this time was spent in counseling,  explanation of diagnosis, planning of further management, and coordination of care.  I have personally reviewed and interpreted on  12/07/2019 daily labs, imagings as discussed above under date review session and assessment and plans.  I reviewed all nursing notes, pharmacy notes, consultant notes,  vitals, pertinent old records  I have discussed plan of care as described above with RN , patient on 12/07/2019  Voice Recognition /Dragon dictation system was used to create this note, attempts have been made to correct errors. Please contact the author with questions and/or clarifications.   Florencia Reasons, MD PhD FACP [facility]  Available via Epic secure chat 7am-7pm for nonurgent issues Please page for urgent issues To page the attending provider between 7A-7P or the covering provider during after hours 7P-7A, please log into the web site [email or web address] and access using universal [place] password for that web site. If you do not have the password, please call the hospital operator.    12/07/2019, 12:19 PM

## 2019-12-07 NOTE — TOC Progression Note (Signed)
Transition of Care [facility]) - Progression Note    Patient Details  Name: [name] MRN: [id number] Date of Birth: 27-Jun-1945  Transition of Care [facility]) CM/SW [place], [place] Phone Number: [phone] 12/07/2019, 4:11 PM  Clinical Narrative:     CSW spoke with patient again about bed choice and patient stated that he tried to speak with his brother however was unable to reach him. CSW explained again that [facility] was the only bed offer at this time therefore if he did not want to go there he would need to be discharged home. Patient asked if he could have a couple of days to think about it. CSW explained that it appeared that he was medically ready for discharge therefore a decision would need to be made.CSW informed him that a TOC team member would follow up with him tomorrow and asked him to reach out to brother again.  TOC team will continue to assist with discharge planning needs.  Expected Discharge Plan: Skilled Nursing Facility Barriers to Discharge: SNF Pending bed offer, Insurance Authorization, Continued Medical Work up  Expected Discharge Plan and Services Expected Discharge Plan: [place] arrangements for the past 2 months: Single Family Home                                       Social Determinants of Health (SDOH) Interventions    Readmission Risk Interventions No flowsheet data found.

## 2019-12-07 NOTE — TOC Progression Note (Signed)
Transition of Care [facility]) - Progression Note    Patient Details  Name: [name] MRN: [id number] Date of Birth: 1945-10-16  Transition of Care [facility]) CM/SW [place], [place] Phone Number:  [phone] 12/07/2019, 12:01 PM  Clinical Narrative:     CSW spoke with patient after several attempts to reach him via phone. CSW explained that there was only one bed at [facility]. He informed CSW that he needed to think about it. CSW informed patient that she would follow up with him later today.  TOC team will continue to assist with discharge planning needs.   Expected Discharge Plan: Skilled Nursing Facility Barriers to Discharge: SNF Pending bed offer, Insurance Authorization, Continued Medical Work up  Expected Discharge Plan and Services Expected Discharge Plan: [place] arrangements for the past 2 months: Single Family Home                                       Social Determinants of Health (SDOH) Interventions    Readmission Risk Interventions No flowsheet data found.

## 2019-12-08 DIAGNOSIS — L89152 Pressure ulcer of sacral region, stage 2: Secondary | ICD-10-CM

## 2019-12-08 MED ORDER — LACTULOSE 10 GM/15ML PO SOLN
20.0000 g | Freq: Two times a day (BID) | ORAL | Status: DC | PRN
  Administered 2019-12-09: 20 g via ORAL
  Filled 2019-12-08: qty 30

## 2019-12-08 MED ORDER — LOSARTAN POTASSIUM 25 MG PO TABS
25.0000 mg | ORAL_TABLET | Freq: Every day | ORAL | Status: DC
  Administered 2019-12-09: 25 mg via ORAL
  Filled 2019-12-08: qty 1

## 2019-12-08 NOTE — Care Management (Signed)
Provided SNF bed offer of [facility]. Patient has accepted. TOC team will contact [facility] RN

## 2019-12-08 NOTE — Progress Notes (Signed)
PROGRESS NOTE    [name]  HER:[id number]  DOB: August 06, 1945  PCP: Biagio Borg, MD Admit date:11/21/2019 Chief compliant: weakness Hospital course: 74 year old with a history of HTN, depression, chronic thrombocytopenia, and CKD stage IIIa who tested positive for Covid 8/3 and presented to the ED on 8/6 with generalized weakness, cough, and confusion. In the ED he was found to be suffering with acute hypoxia and was admitted. Patient is unvaccinated individual. Significant Events:  8/3 CTa chest no evidence of PE but noted patchy peripheral groundglass opacities 8/3 CT head no acute disease 80/3 MRI brain no evidence of infarct hemorrhage or mass 8/6 admitted via [facility] ED with metabolic encephalopathy and acute hypoxia. Started on tretaement for Covid pneumonia with Remedesivit (completed 8/6-8/10) and Steroids (8/6-8/16)    Subjective:  Patient resting comfortably.  Denies any acute complaints.  Saturating well on room air.  Feels lightheaded with position changes.  Patient did have a presyncopal episode while using the toilet yesterday and received IV fluids for 10 hours.  Anticipated SNF transfer in a.m.  Objective: Vitals:   12/08/19 0431 12/08/19 0500 12/08/19 0507 12/08/19 1443  BP:  121/84 121/84 (!) 150/99  Pulse: 68 70 71 72  Resp:  16  16  Temp:  97.9 F (36.6 C)  98.1 F (36.7 C)  TempSrc:  Oral  Oral  SpO2: 95%  100% 98%  Weight:      Height:        Intake/Output Summary (Last 24 hours) at 12/08/2019 1815 Last data filed at 12/08/2019 0715 Gross per 24 hour  Intake 280 ml  Output 200 ml  Net 80 ml   Filed Weights   11/25/19 0444 11/26/19 0417 11/28/19 2252  Weight: 91 kg 91.4 kg 87.9 kg    Physical Examination:  General: Moderately built, no acute distress noted Head ENT: Atraumatic normocephalic, PERRLA, neck supple Heart: S1-S2 heard, regular rate and rhythm, no murmurs.  No leg edema noted Lungs: Equal air entry bilaterally, no rhonchi or  rales on exam, no accessory muscle use Abdomen: Bowel sounds heard, soft, nontender, nondistended. No organomegaly.  No CVA tenderness Extremities: No pedal edema.  No cyanosis or clubbing. Neurological: Awake alert oriented x3, no focal weakness or numbness, strength and sensations to crude touch intact Skin: Stage II pressure wound, sacral area     Data Reviewed: I have personally reviewed following labs and imaging studies  CBC: Recent Labs  Lab 12/02/19 0309 12/03/19 0433 12/06/19 0428  WBC 7.5 7.0 5.0  NEUTROABS 6.2 5.9 3.3  HGB 12.9* 12.2* 11.0*  HCT 39.6 38.2* 34.4*  MCV 88.8 90.1 88.2  PLT 176 188 856*   Basic Metabolic Panel: Recent Labs  Lab 12/02/19 0309 12/03/19 0433 12/04/19 0346 12/06/19 0428  NA 136 137 138 138  K 4.7 4.9 4.0 4.0  CL 99 102 105 105  CO2 29 27 27 27   GLUCOSE 110* 125* 87 97  BUN 22 19 18 15   CREATININE 1.14 0.99 1.01 1.07  CALCIUM 9.2 8.9 8.6* 8.4*  MG 2.3 2.3  --  2.3  PHOS 3.1 2.7  --   --    GFR: Estimated Creatinine Clearance: 63.1 mL/min (by C-G formula based on SCr of 1.07 mg/dL). Liver Function Tests: Recent Labs  Lab 12/02/19 0309 12/03/19 0433  AST 29 30  ALT 36 41  ALKPHOS 75 71  BILITOT 0.8 0.6  PROT 6.8 6.4*  ALBUMIN 2.8* 2.7*   No results for input(s): LIPASE, AMYLASE in  the last 168 hours. No results for input(s): AMMONIA in the last 168 hours. Coagulation Profile: No results for input(s): INR, PROTIME in the last 168 hours. Cardiac Enzymes: No results for input(s): CKTOTAL, CKMB, CKMBINDEX, TROPONINI in the last 168 hours. BNP (last 3 results) No results for input(s): PROBNP in the last 8760 hours. HbA1C: No results for input(s): HGBA1C in the last 72 hours. CBG: No results for input(s): GLUCAP in the last 168 hours. Lipid Profile: No results for input(s): CHOL, HDL, LDLCALC, TRIG, CHOLHDL, LDLDIRECT in the last 72 hours. Thyroid Function Tests: No results for input(s): TSH, T4TOTAL, FREET4, T3FREE,  THYROIDAB in the last 72 hours. Anemia Panel: No results for input(s): VITAMINB12, FOLATE, FERRITIN, TIBC, IRON, RETICCTPCT in the last 72 hours. Sepsis Labs: No results for input(s): PROCALCITON, LATICACIDVEN in the last 168 hours.  No results found for this or any previous visit (from the past 240 hour(s)).    Radiology Studies: No results found.    Scheduled Meds: . vitamin C  500 mg Oral Daily  . citalopram  10 mg Oral Daily  . enoxaparin (LOVENOX) injection  40 mg Subcutaneous Q24H  . losartan  50 mg Oral Daily  . polyethylene glycol  17 g Oral Daily  . senna-docusate  1 tablet Oral QHS  . zinc sulfate  220 mg Oral Daily   Continuous Infusions:   Assessment/Plan:  1.  Covid pneumonia with acute hypoxic respiratory failure: Present on admission.  Completed steroid and remdesivir treatment.  Saturating well on room air and inflammatory markers downtrending.  Repeat chest x-ray in few weeks upon discharge for ensuring resolution.  2.  Acute metabolic encephalopathy: Secondary to problem #1.  CT head showed age-related cerebral atrophy, ventriculomegaly and advanced periventricular white matter disease.  Noted to have short-term memory issues during stay.  3.  AKI on chronic kidney disease stage IIIa: Creatinine 1.5 on presentation and now to baseline at 1.07.  On losartan but not on any diuretics.  Losartan dosage has been titrated during the hospital course.  4.  Hypertension, now orthostatic hypotension: Not on any venodilators.  Will order TED hose.  Will reduce losartan dosage.  Supine SBP in 120s.  Check orthostatics.  5.  Abnormal LFTs: RUQ ultrasound unremarkable except for 2 mm gallbladder polyp.  Could have been related to cholestasis and acute infection.  Now normalized.  6.  Transient thrombocytopenia: Improving with resolution of infection.  7.  Generalized weakness: Likely deconditioning in the setting of problem #1.  PT recommended SNF and patient may be  accepted to [facility] in a.m.  8.  Pressure ulcer identified on 8/11, not present on admission: Seen by wound care and recommended local therapy-Apply alginate dressing to wound bed. Cover with sacral foam. Change every three days and PRN soilage  DVT prophylaxis: Lovenox Code Status: Full code Family / Patient Communication: Discussed with patient Disposition Plan:   Status is: Inpatient  Remains inpatient appropriate because:Unsafe d/c plan   Dispo:  Patient From: [facility]  Planned Disposition: [place]  Expected discharge date: 11/26/19  Medically stable for discharge: Yes              Time spent: 25 minutes     >50% time spent in discussions with care team and coordination of care.    Guilford Shi, MD [facility] Pager in [place]  If 7PM-7AM, please contact night-coverage [email or web address] 12/08/2019, 6:15 PM

## 2019-12-08 NOTE — TOC Progression Note (Signed)
Transition of Care [facility]) - Progression Note    Patient Details  Name: [name] MRN: [id number] Date of Birth: 1945-10-13  Transition of Care [facility] CM/SW [place], [place] Phone Number: 12/08/2019, 10:15 AM  Clinical Narrative:    CSW f/u with [facility] to assess if bed still available. Currently waiver in place for [place]. Ebony Hail in admissions to double check everything. Pt currently stable for d/c pending confirmation of bed- [facility] remains only placement offer at this time.    Expected Discharge Plan: Skilled Nursing Facility Barriers to Discharge: SNF Pending bed offer, Insurance Authorization, Continued Medical Work up  Expected Discharge Plan and Services Expected Discharge Plan: [place] arrangements for the past 2 months: Single Family Home  Readmission Risk Interventions No flowsheet data found.

## 2019-12-08 NOTE — Discharge Summary (Addendum)
Physician Discharge Summary  [name] UTM:[id number] DOB: 08-27-1945 DOA: 11/21/2019  PCP: Biagio Borg, MD  Admit date: 11/21/2019 Discharge date: 12/09/2019 Consultations: Wound care Admitted From: Home Disposition: SNF  Discharge Diagnoses:  Principal Problem:   Acute hypoxemic respiratory failure due to COVID-19 [facility]) Active Problems:   Essential hypertension   Depression   Thrombocytopenia (HCC)   CKD (chronic kidney disease), stage III   Acute metabolic encephalopathy   Pressure injury of skin   Confusion   COVID-19 virus infection   Hyperbilirubinemia   Hypoxia   Shortness of breath   Hospital Course Summary:  74 year old with a history of HTN, depression, chronic thrombocytopenia, and CKD stage IIIa who tested positive for Covid 8/3 and presented to the ED on 8/6 with generalized weakness, cough, and confusion. In the ED he was found to be suffering with acute hypoxia and was admitted. Patient is an unvaccinated individual. Hospital course:  8/3 CTA chest with no evidence of PE but noted patchy peripheral groundglass opacities 8/3 CT head no acute disease 8/3 MRI brain no evidence of infarct hemorrhage or mass 8/6 admitted via [facility] ED with metabolic encephalopathy and acute hypoxia. Started on treatment for Covid pneumonia with Remedesivit (completed 8/6-8/10) and Steroids (8/6-8/16)  1.  Covid pneumonia with acute hypoxic respiratory failure: Present on admission.  Completed steroid and remdesivir treatment.  Saturating well on room air and inflammatory markers downtrending.  Repeat chest x-ray in few weeks upon discharge for ensuring resolution.  2.  Acute metabolic encephalopathy: Secondary to problem #1.  CT head showed age-related cerebral atrophy, ventriculomegaly and advanced periventricular white matter disease.  Noted to have short-term memory issues during stay.  3.  AKI on chronic kidney disease stage IIIa: Creatinine 1.5 on presentation and now to  baseline at 1.07.  On losartan but not on any diuretics.  Losartan dosage has been titrated during the hospital course.  4.  Hypertension, now orthostatic hypotension:  Was on Procardia at home which has been discontinued. Losartan dose was reduced from 100mg  to 50 mg last week. Recommend TED hose.  Reduced losartan dosage further to 25 given persistent orthostasis.  Supine SBP in 120s.  Check orthostatics again here and at SNF, can discontinue Losartan if persists.   5.  Abnormal LFTs: RUQ ultrasound unremarkable except for 2 mm gallbladder polyp.  Could have been related to cholestasis and acute infection.  Now normalized.  6.  Transient thrombocytopenia: Improving with resolution of infection.  7.  Generalized weakness: Likely deconditioning in the setting of problem #1.  PT recommended SNF and patient accepted to [facility].  8.  Pressure ulcer identified on 8/11, not present on admission: Seen by wound care and recommended local therapy-Apply alginate dressing to wound bed. Cover with sacral foam. Change every three days and PRN soilage   Discharge Exam:   Vitals:   12/08/19 0507 12/08/19 1443 12/08/19 2150 12/09/19 0533  BP: 121/84 (!) 150/99 112/71 119/73  Pulse: 71 72 65 61  Resp:  16 16 15   Temp:  98.1 F (36.7 C) 98.2 F (36.8 C) 98.4 F (36.9 C)  TempSrc:  Oral Oral Oral  SpO2: 100% 98% 96% 97%  Weight:      Height:        General: Pt is alert, awake, not in acute distress Cardiovascular: RRR, S1/S2 +, no rubs, no gallops Respiratory: CTA bilaterally, no wheezing, no rhonchi Abdominal: Soft, NT, ND, bowel sounds + Extremities: no edema, no cyanosis  Discharge Condition:Stable  CODE STATUS: Full code Diet recommendation: Heart healthy Recommendations for Outpatient Follow-up:  1. Follow up with PCP: at SNF 2. Follow up with consultants:  3. Please obtain follow up labs including: BMP, Orthostatic check for 3 days     Discharge  Instructions:  Discharge Instructions    Call MD for:  extreme fatigue   Complete by: As directed    Call MD for:  persistant dizziness or light-headedness   Complete by: As directed    Call MD for:  redness, tenderness, or signs of infection (pain, swelling, redness, odor or green/yellow discharge around incision site)   Complete by: As directed    Call MD for:  temperature >100.4   Complete by: As directed    Diet - low sodium heart healthy   Complete by: As directed    Discharge wound care:   Complete by: As directed    Partial thickness loss of dermis, stage 2 Sacral pressure wound: Apply alginate dressing to wound bed. Cover with sacral foam. Change every three days and PRN soilage   Increase activity slowly   Complete by: As directed      Allergies as of 12/09/2019      Reactions   Lisinopril    Angioedema? Patient reports that his tongue and mouth became very swollen, was seen at [facility] for this in 2014.       Medication List    STOP taking these medications   NIFEdipine 30 MG 24 hr tablet Commonly known as: PROCARDIA-XL/NIFEDICAL-XL   OVER THE COUNTER MEDICATION   sildenafil 100 MG tablet Commonly known as: Viagra   tadalafil 20 MG tablet Commonly known as: CIALIS     TAKE these medications   acetaminophen 325 MG tablet Commonly known as: TYLENOL Take 2 tablets (650 mg total) by mouth every 6 (six) hours as needed for mild pain or headache (fever >/= 101).   albuterol 108 (90 Base) MCG/ACT inhaler Commonly known as: VENTOLIN HFA Inhale 1-2 puffs into the lungs every 4 (four) hours as needed for wheezing or shortness of breath.   citalopram 10 MG tablet Commonly known as: CeleXA Take 1 tablet (10 mg total) by mouth daily.   lactulose 10 GM/15ML solution Commonly known as: CHRONULAC Take 30 mLs (20 g total) by mouth 2 (two) times daily as needed for moderate constipation or severe constipation.   losartan 25 MG tablet Commonly known as: COZAAR Take 1  tablet (25 mg total) by mouth daily. What changed:   medication strength  how much to take   multivitamin with minerals tablet Take 1 tablet by mouth daily.   ondansetron 4 MG tablet Commonly known as: ZOFRAN Take 1 tablet (4 mg total) by mouth every 6 (six) hours as needed for nausea.   polyethylene glycol powder 17 GM/SCOOP powder Commonly known as: GLYCOLAX/MIRALAX Take 17 g by mouth daily. What changed:   when to take this  reasons to take this   senna-docusate 8.6-50 MG tablet Commonly known as: Senokot-S Take 1 tablet by mouth at bedtime.   Vitamin D3 50 MCG (2000 UT) capsule Take 2,000 Units by mouth daily.            Discharge Care Instructions  (From admission, onward)         Start     Ordered   12/09/19 0000  Discharge wound care:       Comments: Partial thickness loss of dermis, stage 2 Sacral pressure wound: Apply alginate dressing to wound bed.  Cover with sacral foam. Change every three days and PRN soilage   12/09/19 0923          Allergies  Allergen Reactions  . Lisinopril     Angioedema? Patient reports that his tongue and mouth became very swollen, was seen at [facility] for this in 2014.       The results of significant diagnostics from this hospitalization (including imaging, microbiology, ancillary and laboratory) are listed below for reference.    Labs: BNP (last 3 results) Recent Labs    11/21/19 1548  BNP 50.0   Basic Metabolic Panel: Recent Labs  Lab 12/03/19 0433 12/04/19 0346 12/06/19 0428  NA 137 138 138  K 4.9 4.0 4.0  CL 102 105 105  CO2 27 27 27   GLUCOSE 125* 87 97  BUN 19 18 15   CREATININE 0.99 1.01 1.07  CALCIUM 8.9 8.6* 8.4*  MG 2.3  --  2.3  PHOS 2.7  --   --    Liver Function Tests: Recent Labs  Lab 12/03/19 0433  AST 30  ALT 41  ALKPHOS 71  BILITOT 0.6  PROT 6.4*  ALBUMIN 2.7*   No results for input(s): LIPASE, AMYLASE in the last 168 hours. No results for input(s): AMMONIA in the last 168  hours. CBC: Recent Labs  Lab 12/03/19 0433 12/06/19 0428  WBC 7.0 5.0  NEUTROABS 5.9 3.3  HGB 12.2* 11.0*  HCT 38.2* 34.4*  MCV 90.1 88.2  PLT 188 149*   Cardiac Enzymes: No results for input(s): CKTOTAL, CKMB, CKMBINDEX, TROPONINI in the last 168 hours. BNP: Invalid input(s): POCBNP CBG: No results for input(s): GLUCAP in the last 168 hours. D-Dimer No results for input(s): DDIMER in the last 72 hours. Hgb A1c No results for input(s): HGBA1C in the last 72 hours. Lipid Profile No results for input(s): CHOL, HDL, LDLCALC, TRIG, CHOLHDL, LDLDIRECT in the last 72 hours. Thyroid function studies No results for input(s): TSH, T4TOTAL, T3FREE, THYROIDAB in the last 72 hours.  Invalid input(s): FREET3 Anemia work up No results for input(s): VITAMINB12, FOLATE, FERRITIN, TIBC, IRON, RETICCTPCT in the last 72 hours. Urinalysis    Component Value Date/Time   COLORURINE YELLOW 11/21/2019 1516   APPEARANCEUR CLEAR 11/21/2019 1516   LABSPEC 1.014 11/21/2019 1516   PHURINE 6.0 11/21/2019 1516   GLUCOSEU NEGATIVE 11/21/2019 1516   GLUCOSEU NEGATIVE 01/16/2019 0936   HGBUR MODERATE (A) 11/21/2019 1516   BILIRUBINUR NEGATIVE 11/21/2019 1516   KETONESUR NEGATIVE 11/21/2019 1516   PROTEINUR 100 (A) 11/21/2019 1516   UROBILINOGEN 0.2 01/16/2019 0936   NITRITE NEGATIVE 11/21/2019 1516   LEUKOCYTESUR NEGATIVE 11/21/2019 1516   Sepsis Labs Invalid input(s): PROCALCITONIN,  WBC,  LACTICIDVEN Microbiology No results found for this or any previous visit (from the past 240 hour(s)).  Procedures/Studies: CT Head Wo Contrast  Result Date: 11/18/2019 CLINICAL DATA:  Neurologic deficit. EXAM: CT HEAD WITHOUT CONTRAST TECHNIQUE: Contiguous axial images were obtained from the base of the skull through the vertex without intravenous contrast. COMPARISON:  None. FINDINGS: Brain: Age related cerebral atrophy, ventriculomegaly and advanced periventricular white matter disease. The ventricles are  in the midline without mass effect or shift. No extra-axial fluid collections are identified. No CT findings for acute hemispheric infarction or intracranial hemorrhage. No mass lesions are identified. The brainstem and cerebellum are grossly normal. Vascular: Scattered vascular calcifications. No aneurysm or hyperdense vessels. Skull: No skull fracture or bone lesions. Sinuses/Orbits: The paranasal sinuses and mastoid air cells are clear. The globes are  intact. Other: Mildly prominent left temporalis muscle. No scalp hematoma or obvious laceration. IMPRESSION: 1. Age related cerebral atrophy, ventriculomegaly and advanced periventricular white matter disease. 2. No acute intracranial findings or mass lesions. Electronically Signed   By: Marijo Sanes M.D.   On: 11/18/2019 18:16   CT ANGIO CHEST PE W OR WO CONTRAST  Result Date: 11/28/2019 CLINICAL DATA:  75 year old male with positive COVID-19. Abnormal D-dimer. EXAM: CT ANGIOGRAPHY CHEST WITH CONTRAST TECHNIQUE: Multidetector CT imaging of the chest was performed using the standard protocol during bolus administration of intravenous contrast. Multiplanar CT image reconstructions and MIPs were obtained to evaluate the vascular anatomy. CONTRAST:  119mL OMNIPAQUE IOHEXOL 350 MG/ML SOLN COMPARISON:  CTA chest 11/18/2019. FINDINGS: Cardiovascular: Excellent contrast bolus timing in the pulmonary arterial tree. Trace gas in the main PA, appears inconsequential. Mild respiratory motion at both lung bases. No focal filling defect identified in the pulmonary arteries to suggest acute pulmonary embolism. Little contrast in the aorta today. Calcified aortic atherosclerosis. Calcified coronary artery atherosclerosis again evident. No cardiomegaly or pericardial effusion. Mediastinum/Nodes: Negative, no lymphadenopathy. Lungs/Pleura: Similar lung volumes to the prior CTA. Major airways remain patent. Increased bilateral perihilar and scattered peripheral pulmonary  ground-glass opacity, now moderate in the bilateral lower lobes and left upper lobe. No superimposed pneumothorax or pleural effusion. Upper Abdomen: Negative visible liver, gallbladder, spleen, pancreas, adrenal glands, kidneys, and bowel in the upper abdomen. Musculoskeletal: Degenerative changes in the spine. No acute osseous abnormality identified. Review of the MIP images confirms the above findings. IMPRESSION: 1. Negative for acute pulmonary embolus. 2. Progressed bilateral COVID-19 Pneumonia since 11/18/2019. No pleural effusion. 3. Calcified coronary artery and Aortic Atherosclerosis (ICD10-I70.0). Electronically Signed   By: Genevie Ann M.D.   On: 11/28/2019 00:35   MR BRAIN WO CONTRAST  Result Date: 11/18/2019 CLINICAL DATA:  Off balance EXAM: MRI HEAD WITHOUT CONTRAST TECHNIQUE: Multiplanar, multiecho pulse sequences of the brain and surrounding structures were obtained without intravenous contrast. COMPARISON:  None. FINDINGS: Brain: There is no acute infarction or intracranial hemorrhage. There is no intracranial mass, mass effect, or edema. There is no hydrocephalus or extra-axial fluid collection. Prominence of the ventricles and sulci reflects generalized parenchymal volume loss. Patchy and confluent areas of T2 hyperintensity in the supratentorial white matter are nonspecific but may reflect moderate chronic microvascular ischemic changes. Scattered foci of susceptibility hypointensity in the peripheral cerebral hemispheres most compatible with chronic microhemorrhages. Vascular: Major vessel flow voids at the skull base are preserved. Skull and upper cervical spine: Normal marrow signal is preserved. Sinuses/Orbits: Mild mucosal thickening.  Orbits are unremarkable. Other: Sella is unremarkable.  Mastoid air cells are clear. IMPRESSION: No evidence of recent infarction, hemorrhage, or mass. Moderate chronic microvascular ischemic changes. Mild to moderate burden of chronic microhemorrhages, which  may reflect chronic hypertension or amyloid angiopathy. Electronically Signed   By: Macy Mis M.D.   On: 11/18/2019 19:41   DG CHEST PORT 1 VIEW  Result Date: 11/30/2019 CLINICAL DATA:  Shortness of breath, COVID-19 positivity EXAM: PORTABLE CHEST 1 VIEW COMPARISON:  11/28/2019 CT FINDINGS: Cardiac shadows within normal limits. Bibasilar opacities are noted consistent with the given clinical history of COVID-19 positivity and stable from the recent CT. No new focal abnormality is noted. IMPRESSION: Stable bibasilar opacities consistent with the given clinical history. Electronically Signed   By: Inez Catalina M.D.   On: 11/30/2019 07:20   Portable chest 1 View  Result Date: 11/21/2019 CLINICAL DATA:  COVID positive.  Weakness and  shortness of breath. EXAM: PORTABLE CHEST 1 VIEW COMPARISON:  CT 11/18/2019.  Chest x-ray 11/18/2019. FINDINGS: Cardiomegaly. Tortuous thoracic aorta again noted. Prominent bilateral pulmonary infiltrates/edema noted on today's exam. No pleural effusion or pneumothorax. Degenerative change thoracic spine. IMPRESSION: 1.  Cardiomegaly.  No pulmonary venous congestion. 2. Prominent bilateral pulmonary infiltrates/edema noted on today's exam in this known COVID positive patient. Electronically Signed   By: Marcello Moores  Register   On: 11/21/2019 10:51   DG Chest Port 1 View  Result Date: 11/18/2019 CLINICAL DATA:  Hypotension, cough. EXAM: PORTABLE CHEST 1 VIEW COMPARISON:  11/26/2018 chest radiograph. FINDINGS: Hypoinflated lungs. No pneumothorax or pleural effusion. No focal airspace opacity. Increased conspicuity of the cardiomediastinal silhouette. Tortuous thoracic aorta. Multilevel spondylosis. No acute osseous abnormality. IMPRESSION: No focal airspace disease. Increased conspicuity of the cardiomediastinal silhouette compared to prior exam. While this may be artifactual accentuation secondary to hypoinflation and AP technique, consider further evaluation with CTA chest given  the presence of hypotension. These results were called by telephone at the time of interpretation on 11/18/2019 at 3:54 pm to provider [facility] , who verbally acknowledged these results. Electronically Signed   By: Primitivo Gauze M.D.   On: 11/18/2019 15:57   CT ANGIO CHEST AORTA W/CM & OR WO/CM  Result Date: 11/18/2019 CLINICAL DATA:  Hypertension.  Chest pain. EXAM: CT ANGIOGRAPHY CHEST WITH CONTRAST TECHNIQUE: Multidetector CT imaging of the chest was performed using the standard protocol during bolus administration of intravenous contrast. Multiplanar CT image reconstructions and MIPs were obtained to evaluate the vascular anatomy. CONTRAST:  117mL OMNIPAQUE IOHEXOL 350 MG/ML SOLN COMPARISON:  Chest x-ray 06/2019 FINDINGS: Cardiovascular: The heart is normal in size. No pericardial effusion. Mild tortuosity of the thoracic aorta but no aneurysm or dissection. Scattered atherosclerotic calcifications. The branch vessels are patent. Minimal scattered coronary artery calcifications. The pulmonary arterial tree is fairly well opacified. No filling defects to suggest pulmonary embolism. Mediastinum/Nodes: No mediastinal or hilar mass or lymphadenopathy. The esophagus is grossly normal. Lungs/Pleura: Underlying mild emphysematous changes with some areas of pulmonary scarring. Patchy peripheral ground-glass infiltrates in both lungs most consistent with atypical/viral pneumonia such as COVID pneumonia. Recommend correlation with COVID 19 status. No worrisome pulmonary lesions or pleural effusions. No pulmonary edema. Moderate eventration of the left hemidiaphragm noted with some overlying vascular crowding and atelectasis. Upper Abdomen: No significant upper abdominal findings. Moderate motion artifact. Musculoskeletal: No chest wall mass, supraclavicular or axillary adenopathy. The thyroid gland is unremarkable. The bony thorax is intact. Review of the MIP images confirms the above findings. IMPRESSION: 1.  No CT findings for pulmonary embolism. 2. Mild tortuosity of the thoracic aorta but no aneurysm or dissection. 3. Patchy peripheral ground-glass infiltrates in both lungs most consistent with atypical/viral pneumonia such as COVID pneumonia. Recommend correlation with COVID 19 status. 4. No mediastinal or hilar mass or adenopathy. 5. Emphysema and aortic atherosclerosis. Aortic Atherosclerosis (ICD10-I70.0) and Emphysema (ICD10-J43.9). Electronically Signed   By: Marijo Sanes M.D.   On: 11/18/2019 18:22   VAS [place] LOWER EXTREMITY VENOUS (DVT)  Result Date: 11/28/2019  Lower Venous DVT Study Indications: Elevated Ddimer.  Risk Factors: COVID 19 positive. Comparison Study: No prior studies. Performing Technologist: Oliver Hum RVT  Examination Guidelines: A complete evaluation includes B-mode imaging, spectral Doppler, color Doppler, and power Doppler as needed of all accessible portions of each vessel. Bilateral testing is considered an integral part of a complete examination. Limited examinations for reoccurring indications may be performed as noted. The reflux  portion of the exam is performed with the patient in reverse Trendelenburg.  +---------+---------------+---------+-----------+----------+--------------+ RIGHT    CompressibilityPhasicitySpontaneityPropertiesThrombus Aging +---------+---------------+---------+-----------+----------+--------------+ CFV      Full           Yes      Yes                                 +---------+---------------+---------+-----------+----------+--------------+ SFJ      Full                                                        +---------+---------------+---------+-----------+----------+--------------+ FV Prox  Full                                                        +---------+---------------+---------+-----------+----------+--------------+ FV Mid   Full                                                         +---------+---------------+---------+-----------+----------+--------------+ FV DistalFull                                                        +---------+---------------+---------+-----------+----------+--------------+ PFV      Full                                                        +---------+---------------+---------+-----------+----------+--------------+ POP      Full           Yes      Yes                                 +---------+---------------+---------+-----------+----------+--------------+ PTV      Full                                                        +---------+---------------+---------+-----------+----------+--------------+ PERO     Full                                                        +---------+---------------+---------+-----------+----------+--------------+   +---------+---------------+---------+-----------+----------+--------------+ LEFT     CompressibilityPhasicitySpontaneityPropertiesThrombus Aging +---------+---------------+---------+-----------+----------+--------------+ CFV      Full           Yes  Yes                                 +---------+---------------+---------+-----------+----------+--------------+ SFJ      Full                                                        +---------+---------------+---------+-----------+----------+--------------+ FV Prox  Full                                                        +---------+---------------+---------+-----------+----------+--------------+ FV Mid   Full                                                        +---------+---------------+---------+-----------+----------+--------------+ FV DistalFull                                                        +---------+---------------+---------+-----------+----------+--------------+ PFV      Full                                                         +---------+---------------+---------+-----------+----------+--------------+ POP      Full           Yes      Yes                                 +---------+---------------+---------+-----------+----------+--------------+ PTV      Full                                                        +---------+---------------+---------+-----------+----------+--------------+ PERO     Full                                                        +---------+---------------+---------+-----------+----------+--------------+     Summary: RIGHT: - There is no evidence of deep vein thrombosis in the lower extremity.  - No cystic structure found in the popliteal fossa.  LEFT: - There is no evidence of deep vein thrombosis in the lower extremity.  - No cystic structure found in the popliteal fossa.  *See table(s) above for measurements and observations. Electronically signed by Servando Snare MD on 11/28/2019 at 8:14:57 AM.    Final  US Abdomen Limited RUQ  Result Date: 11/29/2019 CLINICAL DATA:  Hyperbilirubinemia. EXAM: ULTRASOUND ABDOMEN LIMITED RIGHT UPPER QUADRANT COMPARISON:  None. FINDINGS: Gallbladder: A 2.0 mm echogenic polyp is seen within the nondependent portion of the gallbladder wall. No gallstones or wall thickening visualized (1.9 mm). No sonographic Murphy sign noted by sonographer. Common bile duct: Diameter: 5.1 mm Liver: No focal lesion identified. Within normal limits in parenchymal echogenicity. Portal vein is patent on color Doppler imaging with normal direction of blood flow towards the liver. Other: None. IMPRESSION: 2.0 mm gallbladder polyp. Correlation with follow-up right upper quadrant ultrasound is recommended to confirm stability. Electronically Signed   By: Virgina Norfolk M.D.   On: 11/29/2019 17:34    Time coordinating discharge: Over 30 minutes  SIGNED:   Guilford Shi, MD  [facility] 12/09/2019, 9:23 AM

## 2019-12-09 DIAGNOSIS — I951 Orthostatic hypotension: Secondary | ICD-10-CM

## 2019-12-09 MED ORDER — ALBUTEROL SULFATE HFA 108 (90 BASE) MCG/ACT IN AERS
1.0000 | INHALATION_SPRAY | RESPIRATORY_TRACT | Status: DC | PRN
Start: 2019-12-09 — End: 2023-12-05

## 2019-12-09 MED ORDER — ACETAMINOPHEN 325 MG PO TABS: 650.0000 mg | ORAL_TABLET | Freq: Four times a day (QID) | ORAL | Status: AC | PRN

## 2019-12-09 MED ORDER — SENNOSIDES-DOCUSATE SODIUM 8.6-50 MG PO TABS
1.0000 | ORAL_TABLET | Freq: Every day | ORAL | Status: DC
Start: 2019-12-09 — End: 2020-05-24

## 2019-12-09 MED ORDER — LOSARTAN POTASSIUM 25 MG PO TABS: 25.0000 mg | ORAL_TABLET | Freq: Every day | ORAL | Status: DC

## 2019-12-09 MED ORDER — LACTULOSE 10 GM/15ML PO SOLN: 20.0000 g | Freq: Two times a day (BID) | ORAL | 0 refills | Status: DC | PRN

## 2019-12-09 MED ORDER — POLYETHYLENE GLYCOL 3350 17 GM/SCOOP PO POWD
17.0000 g | Freq: Every day | ORAL | 11 refills | Status: DC
Start: 2019-12-09 — End: 2020-05-24

## 2019-12-09 MED ORDER — ONDANSETRON HCL 4 MG PO TABS
4.0000 mg | ORAL_TABLET | Freq: Four times a day (QID) | ORAL | 0 refills | Status: DC | PRN
Start: 2019-12-09 — End: 2020-05-24

## 2019-12-09 NOTE — TOC Transition Note (Addendum)
Transition of Care [facility]) - CM/SW Discharge Note   Patient Details  Name: [name] MRN: [id number] Date of Birth: 1945-05-08  Transition of Care [facility]) CM/SW Contact:  Alexander Mt, LCSW Phone Number: 12/09/2019, 12:18 PM   Clinical Narrative:    Pt agreeable to d/c today to SNF at [facility] (only offer due to [place]). Waiver utilized at this time by SNF to admit pt. Pt aware and requests we call his brother [name] (CSW has left HIPAA compliant voice mail). No controlled scripts noted at this time.    Final next level of care: Skilled Nursing Facility Barriers to Discharge: Barriers Resolved   Patient Goals and CMS Choice CMS Medicare.gov Compare Post Acute Care list provided to:: Patient Choice offered to / list presented to : Patient  Discharge Placement PASRR number recieved: 11/24/19            Patient chooses bed at: [facility] Patient to be transferred to facility by: [place] Name of family member notified: pt brother [name] Patient and family notified of of transfer: 12/09/19   Readmission Risk Interventions No flowsheet data found.

## 2019-12-09 NOTE — Social Work (Signed)
Clinical Social Worker facilitated patient discharge including contacting patient family and facility to confirm patient discharge plans.  Clinical information faxed to facility and family agreeable with plan.  CSW arranged ambulance transport via [facility] to [facility] rm [id number]. RN to call [phone]  with report prior to discharge.  Clinical Social Worker will sign off for now as social work intervention is no longer needed. Please consult [place] again if new need arises.  Westley Hummer, MSW, LCSW Clinical Social Worker

## 2019-12-09 NOTE — Progress Notes (Signed)
[facility] here to transport pt to [facility]. Report was already given by 1st shift Rn. Discharged pt in stable condition.

## 2020-01-09 ENCOUNTER — Telehealth: Payer: Self-pay | Admitting: Internal Medicine

## 2020-01-09 NOTE — Telephone Encounter (Signed)
   Patient is requesting a letter for work.  Patient has been out of work since August due to  [employer]. He is in the process of starting a FMLA claim. He is currently at a rehab center. He is concerned about keeping his job. Can a letter be written to provide his employer until FMLA is in place?

## 2020-01-09 NOTE — Telephone Encounter (Signed)
Sent to Dr. John. 

## 2020-01-12 NOTE — Telephone Encounter (Signed)
I have received FMLA forms for this issue.  Start leave 11/18/19 end leave on 05/18/2020. NOV: 01/21/20 Dr.John are you okay with signing off on these forms?

## 2020-01-12 NOTE — Telephone Encounter (Signed)
No, since that would involve time away from work about 3 months longer than most people would need  Is there a reason he needs a 6 months off work, in other words does he have a written recommendation by his rehab PT or MD that this will be required?  O/w that would seem to be an extremely long time to be out of work  I was thinking more like back to work Feb 16 2020 might be more reasonable unless he has a good reason to be out longer now, or we may need to reassess at that point

## 2020-01-13 NOTE — Telephone Encounter (Signed)
Yes, I think that sounds reasonable, thanks

## 2020-01-13 NOTE — Telephone Encounter (Signed)
Patient is currently in a rehab center. As of right now he does not have a discharge date. We can fill forms out until Nov. &If patient is still in rehab center or not ready to go back to work we can make adjustments then?

## 2020-01-13 NOTE — Telephone Encounter (Signed)
Forms have been completed & Placed in providers box to review and sign.  

## 2020-01-19 DIAGNOSIS — Z0279 Encounter for issue of other medical certificate: Secondary | ICD-10-CM

## 2020-01-19 NOTE — Telephone Encounter (Signed)
Forms have been signed, Faxed to HR @ [phone], Copy sent to scan &Charged for.   Patient informed and original mailed to patient for his records.

## 2020-01-21 ENCOUNTER — Telehealth (INDEPENDENT_AMBULATORY_CARE_PROVIDER_SITE_OTHER): Payer: BC Managed Care – PPO | Admitting: Internal Medicine

## 2020-01-21 ENCOUNTER — Other Ambulatory Visit: Payer: Self-pay

## 2020-01-21 DIAGNOSIS — R410 Disorientation, unspecified: Secondary | ICD-10-CM

## 2020-01-21 DIAGNOSIS — U071 COVID-19: Secondary | ICD-10-CM

## 2020-01-21 DIAGNOSIS — L89152 Pressure ulcer of sacral region, stage 2: Secondary | ICD-10-CM | POA: Diagnosis not present

## 2020-01-21 DIAGNOSIS — N1831 Chronic kidney disease, stage 3a: Secondary | ICD-10-CM | POA: Diagnosis not present

## 2020-01-21 DIAGNOSIS — R0902 Hypoxemia: Secondary | ICD-10-CM

## 2020-01-21 NOTE — Progress Notes (Signed)
Patient ID: [name], male   DOB: 04/10/1946, 74 y.o.   MRN: [id number]  Virtual Visit via Video Note  I connected with [name] on 01/21/20 at  9:40 AM EDT by a video enabled telemedicine application and verified that I am speaking with the correct person using two identifiers.  Location of all participants today Patient: at rehab Provider: at office   I discussed the limitations of evaluation and management by telemedicine and the availability of in person appointments. The patient expressed understanding and agreed to proceed.  History of Present Illness:  Here to f/u post covid hospn, Does c/o ongoing fatigue, but denies signficant daytime hypersomnolence, and now off o2.  Does mention a noticeable difficultly with expressing himself verbally, o/w Pt denies chest pain, increased sob or doe, wheezing, orthopnea, PND, increased LE swelling, palpitations, dizziness or syncope.  Pt denies new neurological symptoms such as new headache, or facial or extremity weakness or numbness   Pt denies polydipsia, polyuria.  His pressure ulcer has resolved.  Has constipation, but lactulose is helping.  Now staying with his sister post rehab Past Medical History:  Diagnosis Date  . Depression 12/30/2013  . Hypertension   . Personal history of colonic polyps 12/30/2013   Per colonoscopy at [facility] per pt, about sept 2014  . PTSD (post-traumatic stress disorder)    possible [place] vet  . Tubular adenoma of colon 2014   Past Surgical History:  Procedure Laterality Date  . COLONOSCOPY    . MUSCLE RECESSION AND RESECTION Left 12/25/2018   Procedure: INFERIOR OBLIQUE RECESSION LEFT EYE, SUPERIOR OBLIQUE TUCK LEFT EYE;  Surgeon: Gevena Cotton, MD;  Location: [facility];  Service: Ophthalmology;  Laterality: Left;  . UPPER GASTROINTESTINAL ENDOSCOPY      reports that he has never smoked. He has never used smokeless tobacco. He reports that he does not drink alcohol and does not use  drugs. family history includes Hypertension in an other family member. Allergies  Allergen Reactions  . Lisinopril     Angioedema? Patient reports that his tongue and mouth became very swollen, was seen at [facility] for this in 2014.    Current Outpatient Medications on File Prior to Visit  Medication Sig Dispense Refill  . acetaminophen (TYLENOL) 325 MG tablet Take 2 tablets (650 mg total) by mouth every 6 (six) hours as needed for mild pain or headache (fever >/= 101).    Marland Kitchen albuterol (VENTOLIN HFA) 108 (90 Base) MCG/ACT inhaler Inhale 1-2 puffs into the lungs every 4 (four) hours as needed for wheezing or shortness of breath.    . Cholecalciferol (VITAMIN D3) 50 MCG (2000 UT) capsule Take 2,000 Units by mouth daily.    . citalopram (CELEXA) 10 MG tablet Take 1 tablet (10 mg total) by mouth daily. 90 tablet 3  . lactulose (CHRONULAC) 10 GM/15ML solution Take 30 mLs (20 g total) by mouth 2 (two) times daily as needed for moderate constipation or severe constipation. 236 mL 0  . losartan (COZAAR) 25 MG tablet Take 1 tablet (25 mg total) by mouth daily.    . Multiple Vitamins-Minerals (MULTIVITAMIN WITH MINERALS) tablet Take 1 tablet by mouth daily.    . ondansetron (ZOFRAN) 4 MG tablet Take 1 tablet (4 mg total) by mouth every 6 (six) hours as needed for nausea. 20 tablet 0  . polyethylene glycol powder (GLYCOLAX/MIRALAX) 17 GM/SCOOP powder Take 17 g by mouth daily. 3350 g 11  . senna-docusate (SENOKOT-S) 8.6-50 MG tablet Take 1  tablet by mouth at bedtime.     No current facility-administered medications on file prior to visit.    Observations/Objective: Alert, NAD, appropriate mood and affect, resps normal, cn 2-12 intact, moves all 4s, no visible rash or swelling Lab Results  Component Value Date   WBC 5.0 12/06/2019   HGB 11.0 (L) 12/06/2019   HCT 34.4 (L) 12/06/2019   PLT 149 (L) 12/06/2019   GLUCOSE 97 12/06/2019   CHOL 131 09/30/2019   TRIG 46.0 09/30/2019   HDL 50.30 09/30/2019    LDLCALC 71 09/30/2019   ALT 41 12/03/2019   AST 30 12/03/2019   NA 138 12/06/2019   K 4.0 12/06/2019   CL 105 12/06/2019   CREATININE 1.07 12/06/2019   BUN 15 12/06/2019   CO2 27 12/06/2019   TSH 0.268 (L) 12/03/2019   PSA 1.43 09/30/2019   Assessment and Plan: See notes  Follow Up Instructions: See notes   I discussed the assessment and treatment plan with the patient. The patient was provided an opportunity to ask questions and all were answered. The patient agreed with the plan and demonstrated an understanding of the instructions.   The patient was advised to call back or seek an in-person evaluation if the symptoms worsen or if the condition fails to improve as anticipated.  Cathlean Cower, MD

## 2020-01-25 ENCOUNTER — Encounter: Payer: Self-pay | Admitting: Internal Medicine

## 2020-01-25 NOTE — Assessment & Plan Note (Signed)
Resolved,  to f/u any worsening symptoms or concerns  

## 2020-01-25 NOTE — Assessment & Plan Note (Addendum)
stable overall by history and exam, recent data reviewed with pt, and pt to continue medical treatment as before,  to f/u any worsening symptoms or concerns  I spent 31 minutes in preparing to see the patient by review of recent labs, imaging and procedures, obtaining and reviewing separately obtained history, communicating with the patient and family or caregiver, ordering medications, tests or procedures, and documenting clinical information in the EHR including the differential Dx, treatment, and any further evaluation and other management of ckd, confusion, covid infection, hypoxia, confusion

## 2020-01-25 NOTE — Assessment & Plan Note (Signed)
For f/u booster shot at 90 days post hospn

## 2020-01-25 NOTE — Assessment & Plan Note (Signed)
Very mild,  to f/u any worsening symptoms or concerns

## 2020-01-25 NOTE — Patient Instructions (Signed)
Please continue all other medications as before, and refills have been done if requested.  Please have the pharmacy call with any other refills you may need.  Please continue your efforts at being more active, low cholesterol diet, and weight control.  Please keep your appointments with your specialists as you may have planned     

## 2020-02-09 ENCOUNTER — Other Ambulatory Visit: Payer: Self-pay

## 2020-02-10 ENCOUNTER — Encounter: Payer: Self-pay | Admitting: Internal Medicine

## 2020-02-10 ENCOUNTER — Ambulatory Visit (INDEPENDENT_AMBULATORY_CARE_PROVIDER_SITE_OTHER): Payer: BC Managed Care – PPO | Admitting: Internal Medicine

## 2020-02-10 VITALS — BP 130/90 | HR 61 | Temp 98.4°F | Ht 67.0 in | Wt 192.0 lb

## 2020-02-10 DIAGNOSIS — J9601 Acute respiratory failure with hypoxia: Secondary | ICD-10-CM | POA: Diagnosis not present

## 2020-02-10 DIAGNOSIS — U071 COVID-19: Secondary | ICD-10-CM | POA: Diagnosis not present

## 2020-02-10 DIAGNOSIS — I10 Essential (primary) hypertension: Secondary | ICD-10-CM

## 2020-02-10 DIAGNOSIS — R410 Disorientation, unspecified: Secondary | ICD-10-CM

## 2020-02-10 NOTE — Assessment & Plan Note (Signed)
With word finding difficulty, declines repeat imaging, likely due to post covid symptos

## 2020-02-10 NOTE — Progress Notes (Signed)
Subjective:    Patient ID: [name], male    DOB: 16-Jul-1945, 74 y.o.   MRN: [id number]  HPI  Here to f/u with wife, still appears marked fatigued, wife pushes him in wheelchair, does not normally leave the house yet post covid infection, now off home o2, left buttock wound healed, still unvaccinated but may consider at 90 days, still has significant word finding issue and memory difficulty, does not feel ready to go back to work, still feels he needs off his full time previous job to [employer].  Also has ongoing constipation but not taking the miralax or lactulase.  BP seems overall improved, wants to try to come off the midodrine.  No other new complaints Past Medical History:  Diagnosis Date  . Depression 12/30/2013  . Hypertension   . Personal history of colonic polyps 12/30/2013   Per colonoscopy at [facility] per pt, about sept 2014  . PTSD (post-traumatic stress disorder)    possible [place] vet  . Tubular adenoma of colon 2014   Past Surgical History:  Procedure Laterality Date  . COLONOSCOPY    . MUSCLE RECESSION AND RESECTION Left 12/25/2018   Procedure: INFERIOR OBLIQUE RECESSION LEFT EYE, SUPERIOR OBLIQUE TUCK LEFT EYE;  Surgeon: Gevena Cotton, MD;  Location: [facility];  Service: Ophthalmology;  Laterality: Left;  . UPPER GASTROINTESTINAL ENDOSCOPY      reports that he has never smoked. He has never used smokeless tobacco. He reports that he does not drink alcohol and does not use drugs. family history includes Hypertension in an other family member. Allergies  Allergen Reactions  . Lisinopril     Angioedema? Patient reports that his tongue and mouth became very swollen, was seen at [facility] for this in 2014.    Current Outpatient Medications on File Prior to Visit  Medication Sig Dispense Refill  . acetaminophen (TYLENOL) 325 MG tablet Take 2 tablets (650 mg total) by mouth every 6 (six) hours as needed for mild pain or headache (fever >/= 101).    Marland Kitchen albuterol  (VENTOLIN HFA) 108 (90 Base) MCG/ACT inhaler Inhale 1-2 puffs into the lungs every 4 (four) hours as needed for wheezing or shortness of breath.    . Cholecalciferol (VITAMIN D3) 50 MCG (2000 UT) capsule Take 2,000 Units by mouth daily.    . citalopram (CELEXA) 10 MG tablet Take 1 tablet (10 mg total) by mouth daily. 90 tablet 3  . lactulose (CHRONULAC) 10 GM/15ML solution Take 30 mLs (20 g total) by mouth 2 (two) times daily as needed for moderate constipation or severe constipation. 236 mL 0  . losartan (COZAAR) 25 MG tablet Take 1 tablet (25 mg total) by mouth daily.    . midodrine (PROAMATINE) 2.5 MG tablet TAKE 1 TABLET BY MOUTH TWICE DAILY FOR LOW BLOOD PRESSURE    . Multiple Vitamins-Minerals (MULTIVITAMIN WITH MINERALS) tablet Take 1 tablet by mouth daily.    . ondansetron (ZOFRAN) 4 MG tablet Take 1 tablet (4 mg total) by mouth every 6 (six) hours as needed for nausea. 20 tablet 0  . polyethylene glycol powder (GLYCOLAX/MIRALAX) 17 GM/SCOOP powder Take 17 g by mouth daily. 3350 g 11  . senna-docusate (SENOKOT-S) 8.6-50 MG tablet Take 1 tablet by mouth at bedtime.     No current facility-administered medications on file prior to visit.   Review of Systems All otherwise neg per pt    Objective:   Physical Exam BP 130/90 (BP Location: Right Arm, Patient Position: Sitting,  Cuff Size: Normal)   Pulse 61   Temp 98.4 F (36.9 C) (Oral)   Ht 5\' 7"  (1.702 m)   Wt 192 lb (87.1 kg)   SpO2 99%   BMI 30.07 kg/m  VS noted,  Constitutional: Pt appears in NAD HENT: Head: NCAT.  Right Ear: External ear normal.  Left Ear: External ear normal.  Eyes: . Pupils are equal, round, and reactive to light. Conjunctivae and EOM are normal Nose: without d/c or deformity Neck: Neck supple. Gross normal ROM Cardiovascular: Normal rate and regular rhythm.   Pulmonary/Chest: Effort normal and breath sounds without rales or wheezing.  Abd:  Soft, NT, ND, + BS, no organomegaly Neurological: Pt is alert.  At baseline orientation, motor grossly intact Skin: Skin is warm. No rashes, other new lesions, no LE edema Psychiatric: Pt behavior is normal without agitation  All otherwise neg per pt  Lab Results  Component Value Date   WBC 5.0 12/06/2019   HGB 11.0 (L) 12/06/2019   HCT 34.4 (L) 12/06/2019   PLT 149 (L) 12/06/2019   GLUCOSE 97 12/06/2019   CHOL 131 09/30/2019   TRIG 46.0 09/30/2019   HDL 50.30 09/30/2019   LDLCALC 71 09/30/2019   ALT 41 12/03/2019   AST 30 12/03/2019   NA 138 12/06/2019   K 4.0 12/06/2019   CL 105 12/06/2019   CREATININE 1.07 12/06/2019   BUN 15 12/06/2019   CO2 27 12/06/2019   TSH 0.268 (L) 12/03/2019   PSA 1.43 09/30/2019       Assessment & Plan:

## 2020-02-10 NOTE — Assessment & Plan Note (Signed)
Improved, ok for d/c midodrine

## 2020-02-10 NOTE — Assessment & Plan Note (Addendum)
Still with marked fatigue, ok for extended work excuse to return to work May 18, 2020  I spent 41 minutes in preparing to see the patient by review of recent labs, imaging and procedures, obtaining and reviewing separately obtained history, communicating with the patient and family or caregiver, ordering medications, tests or procedures, and documenting clinical information in the EHR including the differential Dx, treatment, and any further evaluation and other management of covid infection, confusion, acute hypox resp failure, htn

## 2020-02-10 NOTE — Patient Instructions (Addendum)
Please consider taking the covid vaccination about mid November 2021  Please take the miralax every day, and also the lactulose (the sweet stuff) as needed in addition  Ok to stop the midodrine (the one to keep th BP up)  Please continue all other medications as before, and refills have been done if requested.  Please have the pharmacy call with any other refills you may need.  Please continue your efforts at being more active, low cholesterol diet, and weight control.  Please keep your appointments with your specialists as you may have planned  OK to extend your work excuse to May 18 2020 due to the low stamina and memory issues  Please make an Appointment to return in 2 months

## 2020-02-10 NOTE — Assessment & Plan Note (Signed)
Resolved,  to f/u any worsening symptoms or concerns  

## 2020-02-13 DIAGNOSIS — K5909 Other constipation: Secondary | ICD-10-CM

## 2020-02-13 DIAGNOSIS — F4310 Post-traumatic stress disorder, unspecified: Secondary | ICD-10-CM

## 2020-02-13 DIAGNOSIS — G8929 Other chronic pain: Secondary | ICD-10-CM | POA: Diagnosis not present

## 2020-02-13 DIAGNOSIS — I951 Orthostatic hypotension: Secondary | ICD-10-CM | POA: Diagnosis not present

## 2020-02-13 DIAGNOSIS — Z9181 History of falling: Secondary | ICD-10-CM

## 2020-02-13 DIAGNOSIS — J449 Chronic obstructive pulmonary disease, unspecified: Secondary | ICD-10-CM

## 2020-02-13 DIAGNOSIS — Z8616 Personal history of COVID-19: Secondary | ICD-10-CM

## 2020-02-13 DIAGNOSIS — D696 Thrombocytopenia, unspecified: Secondary | ICD-10-CM

## 2020-02-13 DIAGNOSIS — I1310 Hypertensive heart and chronic kidney disease without heart failure, with stage 1 through stage 4 chronic kidney disease, or unspecified chronic kidney disease: Secondary | ICD-10-CM

## 2020-02-13 DIAGNOSIS — Z86010 Personal history of colonic polyps: Secondary | ICD-10-CM

## 2020-02-13 DIAGNOSIS — Z8701 Personal history of pneumonia (recurrent): Secondary | ICD-10-CM

## 2020-02-13 DIAGNOSIS — M47814 Spondylosis without myelopathy or radiculopathy, thoracic region: Secondary | ICD-10-CM

## 2020-02-13 DIAGNOSIS — N1831 Chronic kidney disease, stage 3a: Secondary | ICD-10-CM

## 2020-02-13 DIAGNOSIS — F32A Depression, unspecified: Secondary | ICD-10-CM

## 2020-02-17 ENCOUNTER — Telehealth: Payer: Self-pay | Admitting: Internal Medicine

## 2020-02-17 NOTE — Telephone Encounter (Signed)
Merrilee Seashore called from [place]  Checked patients blood pressure  Seated it was 196/120 Standing it was 161-103  Patient took 100mg  of losartan today  On his medication list is 25mg  of losartan, which should he use? Please advise if the patient should be taking losartan daily   Patient told Merrilee Seashore that he was only to take Losartan if his BP was high, not daily   Please follow-up with the patient

## 2020-02-18 MED ORDER — LOSARTAN POTASSIUM 50 MG PO TABS: 50.0000 mg | ORAL_TABLET | Freq: Every day | ORAL | 3 refills | Status: DC

## 2020-02-18 NOTE — Telephone Encounter (Signed)
Sent to Dr. John to advise. 

## 2020-02-18 NOTE — Telephone Encounter (Signed)
This is difficult situation, as the BP is overall high, but also falls with standing  Ok for losartan 50 mg which is mid and hopefully "split the middle"

## 2020-02-18 NOTE — Telephone Encounter (Signed)
LDVM for [name] informing him of Dr.Johns orders and note.

## 2020-02-19 NOTE — Telephone Encounter (Signed)
I was able to speak with [name] was able to inform him of Dr. Gwynn Burly instructions.

## 2020-02-19 NOTE — Telephone Encounter (Signed)
Merrilee Seashore calling back and wanted to know how often the medication needs to be taken, his care giver is saying that he only takes it when his BP is high but Merrilee Seashore wanted to make sure he didn't need to be taking this every day. Merrilee Seashore also informed us to contact the patient to clarify the instructions for losartan as well. Nick-[phone]

## 2020-02-23 NOTE — Telephone Encounter (Signed)
Patient's wife dropped off short term benefits forms.  Forms have been completed & Placed in providers box to review and sign once he returns to office.   Wife made aware provider is out of office until 03/01/20.

## 2020-02-27 ENCOUNTER — Telehealth: Payer: Self-pay | Admitting: Internal Medicine

## 2020-02-27 NOTE — Telephone Encounter (Signed)
[name] from the Cognitive unit of [facility]   Called to get a verbal order for Speech therapy.  They had received an order electronically but the speech therapy requires a verbal order.

## 2020-03-01 NOTE — Telephone Encounter (Signed)
   [name] calling back to let us know what those orders for treatment for cognition was. She is requesting 1 week 1, 2 week 1, 1 week 1, 2 week 2, 1 week 1 Phone # [phone] okay to LVM

## 2020-03-01 NOTE — Telephone Encounter (Signed)
Sent to Dr. John. 

## 2020-03-01 NOTE — Telephone Encounter (Signed)
Ok for verbals she is requesting, but o/w I am not sure

## 2020-03-02 NOTE — Telephone Encounter (Signed)
LDVM for [facility] with [facility] for the verbal OK of Speech therapy 1 week 1, 2 week 1, 1 week 1, 2 week 2, 1 week 1 per Dr. Jenny Reichmann.

## 2020-03-17 DIAGNOSIS — R441 Visual hallucinations: Secondary | ICD-10-CM

## 2020-03-17 HISTORY — DX: Visual hallucinations: R44.1

## 2020-03-26 NOTE — Telephone Encounter (Signed)
° ° °  Patient needs to know instructions on taking Losartan Clarification needed

## 2020-03-30 NOTE — Telephone Encounter (Signed)
LDVM for pt with instructions to take his Losartan 50mg  tab one time a day every day.

## 2020-03-31 ENCOUNTER — Other Ambulatory Visit: Payer: Self-pay

## 2020-04-01 ENCOUNTER — Encounter: Payer: Self-pay | Admitting: Internal Medicine

## 2020-04-01 ENCOUNTER — Telehealth: Payer: Self-pay

## 2020-04-01 ENCOUNTER — Ambulatory Visit (INDEPENDENT_AMBULATORY_CARE_PROVIDER_SITE_OTHER): Payer: BC Managed Care – PPO | Admitting: Internal Medicine

## 2020-04-01 VITALS — BP 120/70 | HR 61 | Temp 98.2°F | Ht 67.0 in | Wt 200.2 lb

## 2020-04-01 DIAGNOSIS — E559 Vitamin D deficiency, unspecified: Secondary | ICD-10-CM | POA: Diagnosis not present

## 2020-04-01 DIAGNOSIS — Z0000 Encounter for general adult medical examination without abnormal findings: Secondary | ICD-10-CM

## 2020-04-01 DIAGNOSIS — I10 Essential (primary) hypertension: Secondary | ICD-10-CM

## 2020-04-01 DIAGNOSIS — Z0001 Encounter for general adult medical examination with abnormal findings: Secondary | ICD-10-CM

## 2020-04-01 DIAGNOSIS — N1831 Chronic kidney disease, stage 3a: Secondary | ICD-10-CM

## 2020-04-01 DIAGNOSIS — I9589 Other hypotension: Secondary | ICD-10-CM | POA: Diagnosis not present

## 2020-04-01 LAB — CBC WITH DIFFERENTIAL/PLATELET
Basophils Absolute: 0 10*3/uL (ref 0.0–0.1)
Basophils Relative: 0.8 % (ref 0.0–3.0)
Eosinophils Absolute: 0 10*3/uL (ref 0.0–0.7)
Eosinophils Relative: 0.6 % (ref 0.0–5.0)
HCT: 39.3 % (ref 39.0–52.0)
Hemoglobin: 12.8 g/dL — ABNORMAL LOW (ref 13.0–17.0)
Lymphocytes Relative: 31.7 % (ref 12.0–46.0)
Lymphs Abs: 1.2 10*3/uL (ref 0.7–4.0)
MCHC: 32.6 g/dL (ref 30.0–36.0)
MCV: 86.7 fl (ref 78.0–100.0)
Monocytes Absolute: 0.4 10*3/uL (ref 0.1–1.0)
Monocytes Relative: 9.3 % (ref 3.0–12.0)
Neutro Abs: 2.2 10*3/uL (ref 1.4–7.7)
Neutrophils Relative %: 57.6 % (ref 43.0–77.0)
Platelets: 115 10*3/uL — ABNORMAL LOW (ref 150.0–400.0)
RBC: 4.53 Mil/uL (ref 4.22–5.81)
RDW: 14.6 % (ref 11.5–15.5)
WBC: 3.8 10*3/uL — ABNORMAL LOW (ref 4.0–10.5)

## 2020-04-01 LAB — HEPATIC FUNCTION PANEL
ALT: 20 U/L (ref 0–53)
AST: 20 U/L (ref 0–37)
Albumin: 4.2 g/dL (ref 3.5–5.2)
Alkaline Phosphatase: 64 U/L (ref 39–117)
Bilirubin, Direct: 0.1 mg/dL (ref 0.0–0.3)
Total Bilirubin: 0.4 mg/dL (ref 0.2–1.2)
Total Protein: 7.2 g/dL (ref 6.0–8.3)

## 2020-04-01 LAB — LIPID PANEL
Cholesterol: 174 mg/dL (ref 0–200)
HDL: 71.2 mg/dL (ref >39.00)
LDL Cholesterol: 92 mg/dL (ref 0–99)
NonHDL: 102.52
Total CHOL/HDL Ratio: 2
Triglycerides: 54 mg/dL (ref 0.0–149.0)
VLDL: 10.8 mg/dL (ref 0.0–40.0)

## 2020-04-01 LAB — URINALYSIS, ROUTINE W REFLEX MICROSCOPIC
Bilirubin Urine: NEGATIVE
Hgb urine dipstick: NEGATIVE
Ketones, ur: NEGATIVE
Leukocytes,Ua: NEGATIVE
Nitrite: NEGATIVE
RBC / HPF: NONE SEEN (ref 0–?)
Specific Gravity, Urine: 1.025 (ref 1.000–1.030)
Total Protein, Urine: NEGATIVE
Urine Glucose: NEGATIVE
Urobilinogen, UA: 0.2 (ref 0.0–1.0)
pH: 6.5 (ref 5.0–8.0)

## 2020-04-01 LAB — BASIC METABOLIC PANEL
BUN: 20 mg/dL (ref 6–23)
CO2: 32 mEq/L (ref 19–32)
Calcium: 9.8 mg/dL (ref 8.4–10.5)
Chloride: 104 mEq/L (ref 96–112)
Creatinine, Ser: 1.38 mg/dL (ref 0.40–1.50)
GFR: 50.09 mL/min — ABNORMAL LOW (ref >60.00)
Glucose, Bld: 89 mg/dL (ref 70–99)
Potassium: 4.3 mEq/L (ref 3.5–5.1)
Sodium: 141 mEq/L (ref 135–145)

## 2020-04-01 LAB — TSH: TSH: 1.47 u[IU]/mL (ref 0.35–4.50)

## 2020-04-01 LAB — VITAMIN D 25 HYDROXY (VIT D DEFICIENCY, FRACTURES): VITD: 47.76 ng/mL (ref 30.00–100.00)

## 2020-04-01 LAB — PHOSPHORUS: Phosphorus: 4.4 mg/dL (ref 2.3–4.6)

## 2020-04-01 LAB — PSA: PSA: 0.87 ng/mL (ref 0.10–4.00)

## 2020-04-01 MED ORDER — LOSARTAN POTASSIUM 50 MG PO TABS
50.0000 mg | ORAL_TABLET | Freq: Every day | ORAL | 3 refills | Status: DC
Start: 2020-04-01 — End: 2020-04-01

## 2020-04-01 MED ORDER — LOSARTAN POTASSIUM 50 MG PO TABS
ORAL_TABLET | ORAL | 3 refills | Status: DC
Start: 2020-04-01 — End: 2020-07-19

## 2020-04-01 NOTE — Telephone Encounter (Signed)
Unfortunatelyt I dont have a specific reason to say no heavy lifting over 50 lbs except for his age and fatigue, but those really arent good reasons, and perhaps he should look for a different job if this is his reasons

## 2020-04-01 NOTE — Telephone Encounter (Signed)
Called patient @ [phone] and voicemail was full. Was giving a call in regards to returning to work.

## 2020-04-01 NOTE — Patient Instructions (Addendum)
Ok to stop the midodrine (for low blood pressure)  Please take all new medication as prescribed - the losartan 50 mg per day  Please continue all other medications as before, and refills have been done if requested.  Please have the pharmacy call with any other refills you may need.  Please continue your efforts at being more active, low cholesterol diet, and weight control.  You are otherwise up to date with prevention measures today.  Please keep your appointments with your specialists as you may have planned  Please go to the LAB at the blood drawing area for the tests to be done  You will be contacted by phone if any changes need to be made immediately.  Otherwise, you will receive a letter about your results with an explanation, but please check with MyChart first.  Please remember to sign up for MyChart if you have not done so, as this will be important to you in the future with finding out test results, communicating by private email, and scheduling acute appointments online when needed.  Please make an Appointment to return in 6 months, or sooner if needed

## 2020-04-01 NOTE — Progress Notes (Signed)
Subjective:    Patient ID: [name], male    DOB: 05/05/1945, 74 y.o.   MRN: [id number]  HPI  Here for wellness and f/u;  Overall doing ok;  Pt denies Chest pain, worsening SOB, DOE, wheezing, orthopnea, PND, worsening LE edema, palpitations, dizziness or syncope.  Pt denies neurological change such as new headache, facial or extremity weakness.  Pt denies polydipsia, polyuria, or low sugar symptoms. Pt states overall good compliance with treatment and medications, good tolerability, and has been trying to follow appropriate diet.  Pt denies worsening depressive symptoms, suicidal ideation or panic. No fever, night sweats, wt loss, loss of appetite, or other constitutional symptoms.  Pt states good ability with ADL's, has low fall risk, home safety reviewed and adequate, no other significant changes in hearing or vision, and only occasionally active with exercise. Also, recently pt having to hold midodrine as BP remains high. Past Medical History:  Diagnosis Date  . Depression 12/30/2013  . Hypertension   . Personal history of colonic polyps 12/30/2013   Per colonoscopy at [facility] per pt, about sept 2014  . PTSD (post-traumatic stress disorder)    possible [place] vet  . Tubular adenoma of colon 2014   Past Surgical History:  Procedure Laterality Date  . COLONOSCOPY    . MUSCLE RECESSION AND RESECTION Left 12/25/2018   Procedure: INFERIOR OBLIQUE RECESSION LEFT EYE, SUPERIOR OBLIQUE TUCK LEFT EYE;  Surgeon: Gevena Cotton, MD;  Location: [facility];  Service: Ophthalmology;  Laterality: Left;  . UPPER GASTROINTESTINAL ENDOSCOPY      reports that he has never smoked. He has never used smokeless tobacco. He reports that he does not drink alcohol and does not use drugs. family history includes Hypertension in an other family member. Allergies  Allergen Reactions  . Lisinopril     Angioedema? Patient reports that his tongue and mouth became very swollen, was seen at [facility] for  this in 2014.    Current Outpatient Medications on File Prior to Visit  Medication Sig Dispense Refill  . acetaminophen (TYLENOL) 325 MG tablet Take 2 tablets (650 mg total) by mouth every 6 (six) hours as needed for mild pain or headache (fever >/= 101).    Marland Kitchen albuterol (VENTOLIN HFA) 108 (90 Base) MCG/ACT inhaler Inhale 1-2 puffs into the lungs every 4 (four) hours as needed for wheezing or shortness of breath.    . Cholecalciferol (VITAMIN D3) 50 MCG (2000 UT) capsule Take 2,000 Units by mouth daily.    . citalopram (CELEXA) 10 MG tablet Take 1 tablet (10 mg total) by mouth daily. 90 tablet 3  . lactulose (CHRONULAC) 10 GM/15ML solution Take 30 mLs (20 g total) by mouth 2 (two) times daily as needed for moderate constipation or severe constipation. 236 mL 0  . Multiple Vitamins-Minerals (MULTIVITAMIN WITH MINERALS) tablet Take 1 tablet by mouth daily.    . ondansetron (ZOFRAN) 4 MG tablet Take 1 tablet (4 mg total) by mouth every 6 (six) hours as needed for nausea. 20 tablet 0  . polyethylene glycol powder (GLYCOLAX/MIRALAX) 17 GM/SCOOP powder Take 17 g by mouth daily. 3350 g 11  . senna-docusate (SENOKOT-S) 8.6-50 MG tablet Take 1 tablet by mouth at bedtime.     No current facility-administered medications on file prior to visit.   Review of Systems All otherwise neg per pt    Objective:   Physical Exam BP 120/70   Pulse 61   Temp 98.2 F (36.8 C) (Oral)  Ht 5\' 7"  (1.702 m)   Wt 200 lb 3.2 oz (90.8 kg)   SpO2 98%   BMI 31.36 kg/m  VS noted,  Constitutional: Pt appears in NAD HENT: Head: NCAT.  Right Ear: External ear normal.  Left Ear: External ear normal.  Eyes: . Pupils are equal, round, and reactive to light. Conjunctivae and EOM are normal Nose: without d/c or deformity Neck: Neck supple. Gross normal ROM Cardiovascular: Normal rate and regular rhythm.   Pulmonary/Chest: Effort normal and breath sounds without rales or wheezing.  Abd:  Soft, NT, ND, + BS, no  organomegaly Neurological: Pt is alert. At baseline orientation, motor grossly intact Skin: Skin is warm. No rashes, other new lesions, no LE edema Psychiatric: Pt behavior is normal without agitation  All otherwise neg per pt Lab Results  Component Value Date   WBC 3.8 (L) 04/01/2020   HGB 12.8 (L) 04/01/2020   HCT 39.3 04/01/2020   PLT 115.0 (L) 04/01/2020   GLUCOSE 89 04/01/2020   CHOL 174 04/01/2020   TRIG 54.0 04/01/2020   HDL 71.20 04/01/2020   LDLCALC 92 04/01/2020   ALT 20 04/01/2020   AST 20 04/01/2020   NA 141 04/01/2020   K 4.3 04/01/2020   CL 104 04/01/2020   CREATININE 1.38 04/01/2020   BUN 20 04/01/2020   CO2 32 04/01/2020   TSH 1.47 04/01/2020   PSA 0.87 04/01/2020      Assessment & Plan:

## 2020-04-02 LAB — PTH, INTACT AND CALCIUM
Calcium: 9.8 mg/dL (ref 8.6–10.3)
PTH: 24 pg/mL (ref 14–64)

## 2020-04-04 ENCOUNTER — Encounter: Payer: Self-pay | Admitting: Internal Medicine

## 2020-04-04 NOTE — Assessment & Plan Note (Signed)
stable overall by history and exam, recent data reviewed with pt, and pt to continue medical treatment as before,  to f/u any worsening symptoms or concerns  

## 2020-04-04 NOTE — Assessment & Plan Note (Signed)

## 2020-04-04 NOTE — Assessment & Plan Note (Addendum)
Improved, ok to d/c midodrine  .I spent 31 minutes in addition to time for CPX wellness examination in preparing to see the patient by review of recent labs, imaging and procedures, obtaining and reviewing separately obtained history, communicating with the patient and family or caregiver, ordering medications, tests or procedures, and documenting clinical information in the EHR including the differential Dx, treatment, and any further evaluation and other management of hypotension, htn, ckd, vit d def

## 2020-04-04 NOTE — Assessment & Plan Note (Signed)
To continue oral replacement

## 2020-04-04 NOTE — Assessment & Plan Note (Signed)
Ok to continue losartan 50 qd

## 2020-04-07 ENCOUNTER — Ambulatory Visit: Payer: BC Managed Care – PPO | Admitting: Internal Medicine

## 2020-05-05 ENCOUNTER — Other Ambulatory Visit: Payer: Self-pay

## 2020-05-06 ENCOUNTER — Encounter: Payer: Self-pay | Admitting: Internal Medicine

## 2020-05-06 ENCOUNTER — Other Ambulatory Visit: Payer: Self-pay

## 2020-05-06 ENCOUNTER — Ambulatory Visit (INDEPENDENT_AMBULATORY_CARE_PROVIDER_SITE_OTHER): Payer: BC Managed Care – PPO | Admitting: Internal Medicine

## 2020-05-06 VITALS — BP 128/82 | HR 56 | Temp 98.1°F | Ht 67.0 in | Wt 207.0 lb

## 2020-05-06 DIAGNOSIS — R4189 Other symptoms and signs involving cognitive functions and awareness: Secondary | ICD-10-CM | POA: Diagnosis not present

## 2020-05-06 DIAGNOSIS — I10 Essential (primary) hypertension: Secondary | ICD-10-CM

## 2020-05-06 DIAGNOSIS — N1831 Chronic kidney disease, stage 3a: Secondary | ICD-10-CM

## 2020-05-06 DIAGNOSIS — R413 Other amnesia: Secondary | ICD-10-CM | POA: Diagnosis not present

## 2020-05-06 NOTE — Progress Notes (Signed)
Established Patient Office Visit  Subjective:  Patient ID: [name], male    DOB: 11/23/1945  Age: 75 y.o. MRN: [id number]      Chief Complaint: follow up memory cognitive changes, htn, ckd       HPI:  [name] is a 75 y.o. male here with c/o persistent cognitive changes with difficulty coming up with words ongoing for several years, but possibly worse in last few months. Pt denies chest pain, increased sob or doe, wheezing, orthopnea, PND, increased LE swelling, palpitations, dizziness or syncope.  Pt denies new neurological symptoms such as new headache, or facial or extremity weakness or numbness   Pt denies polydipsia, polyuria, .        Wt Readings from Last 3 Encounters:  05/06/20 207 lb (93.9 kg)  04/01/20 200 lb 3.2 oz (90.8 kg)  02/10/20 192 lb (87.1 kg)   BP Readings from Last 3 Encounters:  05/06/20 128/82  04/01/20 120/70  02/10/20 130/90         Past Medical History:  Diagnosis Date  . Depression 12/30/2013  . Hypertension   . Personal history of colonic polyps 12/30/2013   Per colonoscopy at [facility] per pt, about sept 2014  . PTSD (post-traumatic stress disorder)    possible [place] vet  . Tubular adenoma of colon 2014   Past Surgical History:  Procedure Laterality Date  . COLONOSCOPY    . MUSCLE RECESSION AND RESECTION Left 12/25/2018   Procedure: INFERIOR OBLIQUE RECESSION LEFT EYE, SUPERIOR OBLIQUE TUCK LEFT EYE;  Surgeon: Gevena Cotton, MD;  Location: [facility];  Service: Ophthalmology;  Laterality: Left;  . UPPER GASTROINTESTINAL ENDOSCOPY      reports that he has never smoked. He has never used smokeless tobacco. He reports that he does not drink alcohol and does not use drugs. family history includes Hypertension in an other family member. Allergies  Allergen Reactions  . Lisinopril     Angioedema? Patient reports that his tongue and mouth became very swollen, was seen at [facility] for this in 2014.    Current Outpatient  Medications on File Prior to Visit  Medication Sig Dispense Refill  . acetaminophen (TYLENOL) 325 MG tablet Take 2 tablets (650 mg total) by mouth every 6 (six) hours as needed for mild pain or headache (fever >/= 101).    Marland Kitchen albuterol (VENTOLIN HFA) 108 (90 Base) MCG/ACT inhaler Inhale 1-2 puffs into the lungs every 4 (four) hours as needed for wheezing or shortness of breath.    . Cholecalciferol (VITAMIN D3) 50 MCG (2000 UT) capsule Take 2,000 Units by mouth daily.    . citalopram (CELEXA) 10 MG tablet Take 1 tablet (10 mg total) by mouth daily. 90 tablet 3  . lactulose (CHRONULAC) 10 GM/15ML solution Take 30 mLs (20 g total) by mouth 2 (two) times daily as needed for moderate constipation or severe constipation. 236 mL 0  . losartan (COZAAR) 50 MG tablet 1 tab by mouth 90 tablet 3  . Multiple Vitamins-Minerals (MULTIVITAMIN WITH MINERALS) tablet Take 1 tablet by mouth daily.    . ondansetron (ZOFRAN) 4 MG tablet Take 1 tablet (4 mg total) by mouth every 6 (six) hours as needed for nausea. 20 tablet 0  . polyethylene glycol powder (GLYCOLAX/MIRALAX) 17 GM/SCOOP powder Take 17 g by mouth daily. 3350 g 11  . senna-docusate (SENOKOT-S) 8.6-50 MG tablet Take 1 tablet by mouth at bedtime.     No current facility-administered medications on file prior to visit.  ROS:  All others reviewed and negative.  Objective        PE:  BP 128/82   Pulse (!) 56   Temp 98.1 F (36.7 C) (Oral)   Ht 5\' 7"  (1.702 m)   Wt 207 lb (93.9 kg)   SpO2 95%   BMI 32.42 kg/m                 Constitutional: Pt appears in NAD               HENT: Head: NCAT.                Right Ear: External ear normal.                 Left Ear: External ear normal.                Eyes: . Pupils are equal, round, and reactive to light. Conjunctivae and EOM are normal               Nose: without d/c or deformity               Neck: Neck supple. Gross normal ROM               Cardiovascular: Normal rate and regular rhythm.                  Pulmonary/Chest: Effort normal and breath sounds without rales or wheezing.                Abd:  Soft, NT, ND, + BS, no organomegaly               Neurological: Pt is alert. At baseline orientation, motor grossly intact               Skin: Skin is warm. No rashes, no other new lesions, LE edema - none               Psychiatric: Pt behavior is normal without agitation   Assessment/Plan:  [name] is a 75 y.o. Black or African American [2] male with  has a past medical history of Depression (12/30/2013), Hypertension, Personal history of colonic polyps (12/30/2013), PTSD (post-traumatic stress disorder), and Tubular adenoma of colon (2014).   Micro: none  Cardiac tracings I have personally interpreted today:  none  Pertinent Radiological findings (summarize): none   Lab Results  Component Value Date   WBC 3.8 (L) 04/01/2020   HGB 12.8 (L) 04/01/2020   HCT 39.3 04/01/2020   PLT 115.0 (L) 04/01/2020   GLUCOSE 89 04/01/2020   CHOL 174 04/01/2020   TRIG 54.0 04/01/2020   HDL 71.20 04/01/2020   LDLCALC 92 04/01/2020   ALT 20 04/01/2020   AST 20 04/01/2020   NA 141 04/01/2020   K 4.3 04/01/2020   CL 104 04/01/2020   CREATININE 1.38 04/01/2020   BUN 20 04/01/2020   CO2 32 04/01/2020   TSH 1.47 04/01/2020   PSA 0.87 04/01/2020     Assessment & Plan:   Problem List Items Addressed This Visit      Medium   Essential hypertension    BP Readings from Last 3 Encounters:  05/06/20 128/82  04/01/20 120/70  02/10/20 130/90   Stable, pt to continue medical treatment losartan   Current Outpatient Medications (Cardiovascular):  .  losartan (COZAAR) 50 MG tablet, 1 tab by mouth  Current Outpatient Medications (Respiratory):  .  albuterol (VENTOLIN HFA) 108 (90  Base) MCG/ACT inhaler, Inhale 1-2 puffs into the lungs every 4 (four) hours as needed for wheezing or shortness of breath.  Current Outpatient Medications (Analgesics):  .  acetaminophen (TYLENOL) 325  MG tablet, Take 2 tablets (650 mg total) by mouth every 6 (six) hours as needed for mild pain or headache (fever >/= 101).   Current Outpatient Medications (Other):  Marland Kitchen  Cholecalciferol (VITAMIN D3) 50 MCG (2000 UT) capsule, Take 2,000 Units by mouth daily. .  citalopram (CELEXA) 10 MG tablet, Take 1 tablet (10 mg total) by mouth daily. Marland Kitchen  lactulose (CHRONULAC) 10 GM/15ML solution, Take 30 mLs (20 g total) by mouth 2 (two) times daily as needed for moderate constipation or severe constipation. .  Multiple Vitamins-Minerals (MULTIVITAMIN WITH MINERALS) tablet, Take 1 tablet by mouth daily. .  ondansetron (ZOFRAN) 4 MG tablet, Take 1 tablet (4 mg total) by mouth every 6 (six) hours as needed for nausea. .  polyethylene glycol powder (GLYCOLAX/MIRALAX) 17 GM/SCOOP powder, Take 17 g by mouth daily. Marland Kitchen  senna-docusate (SENOKOT-S) 8.6-50 MG tablet, Take 1 tablet by mouth at bedtime.       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   Follow-up: Return in about 3 months (around 08/04/2020).   Cathlean Cower, MD 05/16/2020 10:24 PM [facility]

## 2020-05-06 NOTE — Patient Instructions (Signed)
You will be contacted regarding the referral for: neurology  [place] for the FMLA to be extended to Aug 15, 2020  Please continue all other medications as before, and refills have been done if requested.  Please have the pharmacy call with any other refills you may need.  Please continue your efforts at being more active, low cholesterol diet, and weight control.  Please keep your appointments with your specialists as you may have planned  Please make an Appointment to return in 3 months

## 2020-05-10 ENCOUNTER — Telehealth: Payer: Self-pay | Admitting: Internal Medicine

## 2020-05-10 NOTE — Telephone Encounter (Signed)
[name] asking to speak with you, stating she has some questions. Would not expand any further states she will wait until [name] returns her call. States its urgent. [phone]

## 2020-05-10 NOTE — Telephone Encounter (Signed)
Spoke with patient and [name]. They wanted to know if I had received 703 Feb forms. Informed I have not. They are going to contact employer and bring forms by.

## 2020-05-16 ENCOUNTER — Encounter: Payer: Self-pay | Admitting: Internal Medicine

## 2020-05-16 DIAGNOSIS — R4189 Other symptoms and signs involving cognitive functions and awareness: Secondary | ICD-10-CM | POA: Insufficient documentation

## 2020-05-16 NOTE — Assessment & Plan Note (Signed)
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

## 2020-05-16 NOTE — Assessment & Plan Note (Signed)
Lab Results  Component Value Date   CREATININE 1.38 04/01/2020   Stable overall, cont to avoid nephrotoxins

## 2020-05-16 NOTE — Assessment & Plan Note (Signed)
BP Readings from Last 3 Encounters:  05/06/20 128/82  04/01/20 120/70  02/10/20 130/90   Stable, pt to continue medical treatment losartan   Current Outpatient Medications (Cardiovascular):  .  losartan (COZAAR) 50 MG tablet, 1 tab by mouth  Current Outpatient Medications (Respiratory):  .  albuterol (VENTOLIN HFA) 108 (90 Base) MCG/ACT inhaler, Inhale 1-2 puffs into the lungs every 4 (four) hours as needed for wheezing or shortness of breath.  Current Outpatient Medications (Analgesics):  .  acetaminophen (TYLENOL) 325 MG tablet, Take 2 tablets (650 mg total) by mouth every 6 (six) hours as needed for mild pain or headache (fever >/= 101).   Current Outpatient Medications (Other):  Marland Kitchen  Cholecalciferol (VITAMIN D3) 50 MCG (2000 UT) capsule, Take 2,000 Units by mouth daily. .  citalopram (CELEXA) 10 MG tablet, Take 1 tablet (10 mg total) by mouth daily. Marland Kitchen  lactulose (CHRONULAC) 10 GM/15ML solution, Take 30 mLs (20 g total) by mouth 2 (two) times daily as needed for moderate constipation or severe constipation. .  Multiple Vitamins-Minerals (MULTIVITAMIN WITH MINERALS) tablet, Take 1 tablet by mouth daily. .  ondansetron (ZOFRAN) 4 MG tablet, Take 1 tablet (4 mg total) by mouth every 6 (six) hours as needed for nausea. .  polyethylene glycol powder (GLYCOLAX/MIRALAX) 17 GM/SCOOP powder, Take 17 g by mouth daily. Marland Kitchen  senna-docusate (SENOKOT-S) 8.6-50 MG tablet, Take 1 tablet by mouth at bedtime.

## 2020-05-17 ENCOUNTER — Telehealth: Payer: Self-pay | Admitting: Internal Medicine

## 2020-05-17 NOTE — Telephone Encounter (Signed)
   [name] Valere Dross calling because she tried to make patient an appointment with Neuro and they state they need additional information  Please call

## 2020-05-18 NOTE — Telephone Encounter (Signed)
[name] is calling and was wondering if the additional info has been sent to Neuro.   Please call

## 2020-05-18 NOTE — Telephone Encounter (Signed)
Office notes have been finished. Called Neurology yesterday to let them know and to contact pt  Called and spoke to pt today and he is aware

## 2020-05-19 ENCOUNTER — Encounter: Payer: Self-pay | Admitting: Neurology

## 2020-05-24 ENCOUNTER — Encounter: Payer: Self-pay | Admitting: Neurology

## 2020-05-24 ENCOUNTER — Other Ambulatory Visit: Payer: Self-pay

## 2020-05-24 ENCOUNTER — Other Ambulatory Visit (INDEPENDENT_AMBULATORY_CARE_PROVIDER_SITE_OTHER): Payer: BC Managed Care – PPO

## 2020-05-24 ENCOUNTER — Ambulatory Visit: Payer: BC Managed Care – PPO | Admitting: Neurology

## 2020-05-24 VITALS — BP 92/56 | HR 84 | Ht 67.0 in | Wt 209.2 lb

## 2020-05-24 DIAGNOSIS — R413 Other amnesia: Secondary | ICD-10-CM

## 2020-05-24 DIAGNOSIS — R441 Visual hallucinations: Secondary | ICD-10-CM

## 2020-05-24 LAB — C-REACTIVE PROTEIN: CRP: <1 mg/dL (ref 0.5–20.0)

## 2020-05-24 LAB — SEDIMENTATION RATE: Sed Rate: 9 mm/hr (ref 0–20)

## 2020-05-24 NOTE — Patient Instructions (Signed)
1. Bloodwork for RPR, ESR, CRP, ANA  2. Schedule MRI brain with and without contrast  3. Schedule Neurocognitive testing  4. Follow-up after tests, call for any changes   You have been referred for a neuropsychological evaluation (i.e., evaluation of memory and thinking abilities). Please bring someone with you to this appointment if possible, as it is helpful for the doctor to hear from both you and another adult who knows you well. Please bring eyeglasses and hearing aids if you wear them.    The evaluation will take approximately 3 hours and has two parts:   . The first part is a clinical interview with the neuropsychologist (Dr. Melvyn Novas or Dr. Nicole Kindred). During the interview, the neuropsychologist will speak with you and the individual you brought to the appointment.    . The second part of the evaluation is testing with the doctor's technician Hinton Dyer or Maudie Mercury). During the testing, the technician will ask you to remember different types of material, solve problems, and answer some questionnaires. Your family member will not be present for this portion of the evaluation.   Please note: We must reserve several hours of the neuropsychologist's time and the psychometrician's time for your evaluation appointment. As such, there is a No-Show fee of $100. If you are unable to attend any of your appointments, please contact our office as soon as possible to reschedule.

## 2020-05-24 NOTE — Progress Notes (Signed)
NEUROLOGY CONSULTATION NOTE  [name] MRN: [id number] DOB: [birth year]  Referring provider: Dr. Cathlean Valdez Primary care provider: Dr. Cathlean Valdez  Reason for consult:  Memory loss  Dear Dr [name]:  Thank you for your kind referral of [name] for consultation of the above symptoms. Although his history is well known to you, please allow me to reiterate it for the purpose of our medical record. The patient was accompanied to the clinic by his friend [name] who also provides collateral information. Records and images were personally reviewed where available.   HISTORY OF PRESENT ILLNESS: This is a pleasant 75 year old left-handed man with a history of hypertension, presenting for evaluation of memory loss. His friend [name] is present however both are poor historians. Records on EPIC were reviewed. He was seen by Dr. Jenny Valdez on 11/18/19 for generalized malaise and feeling off balance for a week, he was noted to be mildly confused. O2 sats ranged from 85-88%, he was sent to the ER where initial BP was 86/63 which improved after a few hours. I personally reviewed MRI brain without contrast which did not show any acute changes, there was moderate chronic microvascular disease, mild to moderate burden of chronic microhemorrhages. After ER discharge, COVID test came back positive, however they were unable to get a hold of the patient until he presented to the ER on 11/21/19 with cough, confusion, weakness. He was very slow to answer questions but answering appropriately. He was started on Remdesivir and steroids. He was discharged to rehab, then moved in with his sister on discharge last October. [name] note indicates noticeable difficulty expressing himself verbally. He states his memory comes and goes. [name] notes that "this guy is a sharp one," until hospital discharge when she noticed memory changes. She thinks he is just trying to deal with what his body is going  through and he is going through a little memory problem. Prior to hospitalization, he was living alone independently, he was driving without getting lost and managing his own medications. Since his illness, he has not been driving. His sister manages medications. He feels his thinking is a little sluggish, he would be having a conversation and all of a sudden cannot pull a word up. He manages his finances, his sister reminds him. He reports new symptoms that started a month ago where he would see images at night, he reports he dreams a lot, he sees people in the room, sometimes wearing a black hat, no auditory component. Sometimes the person tried to lay down beside him. It would disappear after a while. No daytime hallucinations.  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 He reports depression comes and goes when he does not have a lot to do, if he is tired, mood is staggered and he would rather be alone. [name] thinks he had been so active so he is not used to this now. No family history of dementia. He fell off a house years ago with loss of consciousness and ?shaking. No alcohol use. He reports horizontal diplopia, he had seen his eye doctor and was given a new prescription but still has binocular diplopia. He gets dizzy when standing initially. He has a stinging sensation in his feet. He has been dealing with constipation, which [name] feels started all of this (was occurring prior to [name]). He denies any headaches, dysarthria/dysphagia, neck/back pain, focal numbness/tingling/weakness, anosmia. He has shakiness when he drinks a cup  of coffee.    Lab Results  Component Value Date   TSH 1.47 04/01/2020   Lab Results  Component Value Date   [id number] 387 11/21/2019      PAST MEDICAL HISTORY: Past Medical History:  Diagnosis Date  . Depression 12/30/2013  . Hypertension   . Personal history of colonic polyps 12/30/2013   Per colonoscopy at [facility]  per pt, about sept 2014  . PTSD (post-traumatic stress disorder)    possible [place] vet  . Tubular adenoma of colon 2014    PAST SURGICAL HISTORY: Past Surgical History:  Procedure Laterality Date  . COLONOSCOPY    . MUSCLE RECESSION AND RESECTION Left 12/25/2018   Procedure: INFERIOR OBLIQUE RECESSION LEFT EYE, SUPERIOR OBLIQUE TUCK LEFT EYE;  Surgeon: Gevena Cotton, MD;  Location: [facility];  Service: Ophthalmology;  Laterality: Left;  . UPPER GASTROINTESTINAL ENDOSCOPY      MEDICATIONS: Current Outpatient Medications on File Prior to Visit  Medication Sig Dispense Refill  . acetaminophen (TYLENOL) 325 MG tablet Take 2 tablets (650 mg total) by mouth every 6 (six) hours as needed for mild pain or headache (fever >/= 101).    Marland Kitchen albuterol (VENTOLIN HFA) 108 (90 Base) MCG/ACT inhaler Inhale 1-2 puffs into the lungs every 4 (four) hours as needed for wheezing or shortness of breath.    . Cholecalciferol (VITAMIN D3) 50 MCG (2000 UT) capsule Take 2,000 Units by mouth daily.    . citalopram (CELEXA) 10 MG tablet Take 1 tablet (10 mg total) by mouth daily. 90 tablet 3  . lactulose (CHRONULAC) 10 GM/15ML solution Take 30 mLs (20 g total) by mouth 2 (two) times daily as needed for moderate constipation or severe constipation. 236 mL 0  . losartan (COZAAR) 50 MG tablet 1 tab by mouth 90 tablet 3  . Multiple Vitamins-Minerals (MULTIVITAMIN WITH MINERALS) tablet Take 1 tablet by mouth daily.    . ondansetron (ZOFRAN) 4 MG tablet Take 1 tablet (4 mg total) by mouth every 6 (six) hours as needed for nausea. 20 tablet 0  . polyethylene glycol powder (GLYCOLAX/MIRALAX) 17 GM/SCOOP powder Take 17 g by mouth daily. 3350 g 11  . senna-docusate (SENOKOT-S) 8.6-50 MG tablet Take 1 tablet by mouth at bedtime.     No current facility-administered medications on file prior to visit.    ALLERGIES: Allergies  Allergen Reactions  . Lisinopril     Angioedema? Patient reports that his  tongue and mouth became very swollen, was seen at [facility] for this in 2014.     FAMILY HISTORY: Family History  Problem Relation Age of Onset  . Hypertension Other   . Colon cancer Neg Hx   . Esophageal cancer Neg Hx   . Stomach cancer Neg Hx   . Rectal cancer Neg Hx     SOCIAL HISTORY: Social History   Socioeconomic History  . Marital status: Single    Spouse name: Not on file  . Number of children: Not on file  . Years of education: Not on file  . Highest education level: Not on file  Occupational History  . Occupation: retired  Tobacco Use  . Smoking status: Never Smoker  . Smokeless tobacco: Never Used  Vaping Use  . Vaping Use: Never used  Substance and Sexual Activity  . Alcohol use: No    Alcohol/week: 0.0 standard drinks  . Drug use: No  . Sexual activity: Yes  Other Topics Concern  . 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 Year is 2021. No dysarthria or aphasia, he is slow to respond to questions but answers appropriately. Fund of knowledge is reduced.  Recent and remote memory are impaired. Attention and concentration are reduced. Able to name objects. Difficulty with repetition. Difficulty with executive function tasks. [facility] 14/30 Montreal Cognitive Assessment  05/24/2020  Visuospatial/  Executive (0/5) 1  Naming (0/3) 3  Attention: Read list of digits (0/2) 2  Attention: Read list of letters (0/1) 0  Attention: Serial 7 subtraction starting at 100 (0/3) 2  Language: Repeat phrase (0/2) 0  Language : Fluency (0/1) 1  Abstraction (0/2) 1  Delayed Recall (0/5) 0  Orientation (0/6) 4  Total 14  Adjusted Score (based on education) 14    Cranial nerves: CN I: not tested CN II: pupils equal, round and reactive to light, visual fields intact with individual eye testing. When testeed  CN III, IV, VI:  full range of motion, no nystagmus, no ptosis CN V: facial sensation intact CN VII: upper and lower face symmetric CN VIII: hearing intact to conversation CN IX, X: gag intact, uvula midline CN XI: sternocleidomastoid and trapezius muscles intact CN XII: tongue midline Bulk & Tone: normal, no cogwheeling, no fasciculations. Motor: 5/5 throughout with no pronator drift. Sensation: intact to light touch, cold, pin, vibration sense.  No extinction to double simultaneous stimulation.  Romberg test negative Deep Tendon Reflexes: +2 throughout Cerebellar: no incoordination on finger to nose testing Gait: slow and cautious with reduced arm swing, normal stride length Tremor: none   IMPRESSION: his is a pleasant 75 year old left-handed man with a history of hypertension, presenting for evaluation of memory loss that appeared to have started acutely when he had COVID in August 2021, however cognitive issues have persisted since then. He is also reporting new onset visual hallucinations at night. Neurological exam non-focal, MOCA score 14/30. Etiology of symptoms unclear, repeat MRI brain with and without contrast will be ordered to assess for underlying structural abnormality. He will be scheduled for Neurocognitive testing to further evaluate cognitive concerns. Check RPR, ESR, CRP, ANA. He has a flat affect and some psychomotor retardation, raising possibility of depression also  contributing to symptoms. Follow-up after tests, he knows to call for any changes.   Thank you for allowing me to participate in the care of this patient. Please do not hesitate to call for any questions or concerns.   Ellouise Newer, M.D.  CC: Dr. Jenny Valdez

## 2020-05-26 LAB — ANTI-NUCLEAR AB-TITER (ANA TITER): ANA Titer 1: 1:40 {titer} — ABNORMAL HIGH

## 2020-05-26 LAB — ANA: Anti Nuclear Antibody (ANA): POSITIVE — AB

## 2020-05-26 LAB — RPR: RPR Ser Ql: NONREACTIVE

## 2020-05-27 ENCOUNTER — Ambulatory Visit
Admission: RE | Admit: 2020-05-27 | Discharge: 2020-05-27 | Disposition: A | Discharge status: Home / Self Care | Payer: BC Managed Care – PPO | Source: Ambulatory Visit | Attending: Neurology | Admitting: Neurology

## 2020-05-27 DIAGNOSIS — R441 Visual hallucinations: Secondary | ICD-10-CM

## 2020-05-27 DIAGNOSIS — R413 Other amnesia: Secondary | ICD-10-CM

## 2020-05-27 MED ORDER — GADOBENATE DIMEGLUMINE 529 MG/ML IV SOLN
18.0000 mL | Freq: Once | INTRAVENOUS | Status: AC | PRN
  Administered 2020-05-27: 18 mL via INTRAVENOUS

## 2020-05-29 ENCOUNTER — Other Ambulatory Visit: Payer: BC Managed Care – PPO

## 2020-06-08 ENCOUNTER — Other Ambulatory Visit: Payer: Self-pay

## 2020-06-08 ENCOUNTER — Encounter: Payer: Self-pay | Admitting: Psychology

## 2020-06-08 ENCOUNTER — Ambulatory Visit (INDEPENDENT_AMBULATORY_CARE_PROVIDER_SITE_OTHER): Payer: BC Managed Care – PPO | Admitting: Psychology

## 2020-06-08 ENCOUNTER — Ambulatory Visit: Payer: BC Managed Care – PPO | Admitting: Psychology

## 2020-06-08 DIAGNOSIS — I69919 Unspecified symptoms and signs involving cognitive functions following unspecified cerebrovascular disease: Secondary | ICD-10-CM

## 2020-06-08 DIAGNOSIS — Z8616 Personal history of COVID-19: Secondary | ICD-10-CM

## 2020-06-08 DIAGNOSIS — G9341 Metabolic encephalopathy: Secondary | ICD-10-CM

## 2020-06-08 DIAGNOSIS — R4189 Other symptoms and signs involving cognitive functions and awareness: Secondary | ICD-10-CM

## 2020-06-08 DIAGNOSIS — J9601 Acute respiratory failure with hypoxia: Secondary | ICD-10-CM

## 2020-06-08 DIAGNOSIS — K635 Polyp of colon: Secondary | ICD-10-CM | POA: Insufficient documentation

## 2020-06-08 DIAGNOSIS — U071 COVID-19: Secondary | ICD-10-CM | POA: Diagnosis not present

## 2020-06-08 DIAGNOSIS — R0902 Hypoxemia: Secondary | ICD-10-CM | POA: Diagnosis not present

## 2020-06-08 DIAGNOSIS — Z9189 Other specified personal risk factors, not elsewhere classified: Secondary | ICD-10-CM | POA: Insufficient documentation

## 2020-06-08 DIAGNOSIS — F0280 Dementia in other diseases classified elsewhere without behavioral disturbance: Secondary | ICD-10-CM

## 2020-06-08 HISTORY — DX: Dementia in other diseases classified elsewhere without behavioral disturbance: F02.80

## 2020-06-08 HISTORY — DX: Unspecified symptoms and signs involving cognitive functions following unspecified cerebrovascular disease: I69.919

## 2020-06-08 NOTE — Progress Notes (Signed)
° °  Psychometrician Note   Cognitive testing was administered to [name] by Milana Kidney, B.S. (psychometrist) under the supervision of Dr. Christia Reading, Ph.D., licensed psychologist on 06/08/20. [name] did not appear overtly distressed by the testing session per behavioral observation or responses across self-report questionnaires. Dr. Christia Reading, Ph.D. checked in with [name] as needed to manage any distress related to testing procedures (if applicable). Rest breaks were offered.    The battery of tests administered was selected by Dr. Christia Reading, Ph.D. with consideration to [name]'s current level of functioning, the nature of his symptoms, emotional and behavioral responses during interview, level of literacy, observed level of motivation/effort, and the nature of the referral question. This battery was communicated to the psychometrist. Communication between Dr. Christia Reading, Ph.D. and the psychometrist was ongoing throughout the evaluation and Dr. Christia Reading, Ph.D. was immediately accessible at all times. Dr. Christia Reading, Ph.D. provided supervision to the psychometrist on the date of this service to the extent necessary to assure the quality of all services provided.    [name] will return within approximately 1-2 weeks for an interactive feedback session with Dr. Melvyn Novas at which time his test performances, clinical impressions, and treatment recommendations will be reviewed in detail. [name] understands he can contact our office should he require our assistance before this time.  A total of 145 minutes of billable time were spent face-to-face with [name] by the psychometrist. This includes both test administration and scoring time. Billing for these services is reflected in the clinical report generated by Dr. Christia Reading, Ph.D..  This note reflects time spent with the psychometrician and does not include test scores or any  clinical interpretations made by Dr. Melvyn Novas. The full report will follow in a separate note.

## 2020-06-08 NOTE — Progress Notes (Addendum)
NEUROPSYCHOLOGICAL EVALUATION [place]  Date of Evaluation: June 08, 2020  Reason for Referral:   [name] is a 75 y.o. left-handed African-American male referred by Ellouise Newer, M.D., to characterize his current cognitive functioning and assist with diagnostic clarity and treatment planning in the context of subjective cognitive decline following COVID-19 infection, as well as several positive neuroimaging findings.   Assessment and Plan:   Clinical Impression(s): Scores across embedded performance validity measures were below expectation. One stand-alone measure was discontinued due to vision deficits. [name] also appeared fatigued throughout the evaluation and may have briefly nodded off a time or two during testing. There remains the potential that below expectation performances represent true cognitive dysfunction and I do not believe that he was attempting to perform poorly across testing. However, mild caution is warranted when interpreting test results.  If taken at face value, [name]'s pattern of performance is suggestive of fairly diffuse cognitive impairment. Performance variability was exhibited across basic attention, phonemic fluency, and confrontation naming. He performed well across a simple task verbally sequencing numbers, as well as when copying a complex figure; however, other visuospatial performances were impaired. Overall, impairments were seen across attention/concentration, executive functioning, receptive language, semantic fluency, and all aspects of learning and memory. Since August, [name] reported utilizing a caretaker and receives assistance/reminders with medication management and bill paying, suggesting ADL dysfunction. He also has not resumed driving, partially due to residual double vision. However, the impact of fatigue cannot be ignored as a potential explanation for some of  these deficits.  The etiology of ongoing dysfunction is difficulty to discern, in large part due to the potential impact of fatigue on testing and the diffuse nature of cognitive impairment. However, the cause of dysfunction is likely multifactorial in nature. Prior neuroimaging revealed cerebral atrophy, ex-vacuo ventriculomegaly, advanced periventricular white matter disease, and a mild to moderate burden of chronic microhemorrhages; the latter was said to potentially reflect chronic hypertension or amyloid angiopathy. Given the advanced nature of some neuroimaging findings, there is a strong likelihood that cognitive deficits were present to an unknown and likely more mild degree prior to his COVID-19 infection. A primary vascular etiology would appear to make the most sense and would result in deficits largely surrounding processing speed, attention/concentration, executive functioning, and encoding/retrieval aspects of memory. These deficits were likely significantly exacerbated by his COVID-19 infection, especially as this infection resulted in metabolic encephalopathy with evidence for hypoxia. Anatomical areas of the brain responsible for memory are highly susceptible to the effects of hypoxia and this could certainly explain significant memory dysfunction across testing. I cannot rule in or out the presence of an underlying neurological process such as Alzheimer's disease given the diffuse nature of impairment and the likely contribution of other factors. However, if microhemorrhaging is due to amyloid angiopathy, this would represent a well known risk-factor for this illness. The presence of fully-formed visual hallucinations would also increase the risk for a Lewy body dementia presentation if not better explained by potential medication side-effects. Reported psychiatric distress was quite mild and would not explain the extent of cognitive dysfunction currently exhibited by [name]. Continued  medical monitoring will be important moving forward.  Recommendations: A repeat neuropsychological evaluation in 12 months is recommended to assess the trajectory of cognitive abilities. Ideally, this would be performed when fatigue and visual disturbances have been improved to allow for added clarity regarding ongoing cognitive dysfunction. It would be of  benefit to stretch his next testing appointment into two smaller sessions rather than one larger session to help limit the impact of fatigue.   [name] is encouraged to attend to lifestyle factors for brain health (e.g., regular physical exercise, good nutrition habits, regular participation in cognitively-stimulating activities, and general stress management techniques), which are likely to have benefits for both emotional adjustment and cognition. In fact, in addition to promoting good general health, regular exercise incorporating aerobic activities (e.g., brisk walking, jogging, cycling, etc.) has been demonstrated to be a very effective treatment for depression and stress, with similar efficacy rates to both antidepressant medication and psychotherapy. Optimal control of vascular risk factors (including safe cardiovascular exercise and adherence to dietary recommendations) is encouraged.   While performance across neurocognitive testing is not a strong predictor of an individual's safety operating a motor vehicle. I do agree with his continued driving abstinence. Should he or his his family wish to pursue a formalized driving evaluation, they would be encouraged to contact [employer] in [place], [place] at [phone]. Another option would be through [facility]; however, the latter would likely require a referral from a medical doctor. [facility] can be reached directly at [phone].   Should there be a progression of his current deficits over time, [name] is unlikely to regain any independent living skills  lost. Therefore, it is recommended that he remain as involved as possible in all aspects of household chores, finances, and medication management, with supervision to ensure adequate performance. He will likely benefit from the establishment and maintenance of a routine in order to maximize his functional abilities over time.  It will be important for [name] to have another person with him when in situations where he may need to process information, weigh the pros and cons of different options, and make decisions, in order to ensure that he fully understands and recalls all information to be considered.  When learning new information, he would benefit from information being broken up into small, manageable pieces. He may also find it helpful to articulate the material in his own words and in a context to promote encoding at the onset of a new task. This material may need to be repeated multiple times to promote encoding.  Memory can be improved using internal strategies such as rehearsal, repetition, chunking, mnemonics, association, and imagery. External strategies such as written notes in a consistently used memory journal, visual and nonverbal auditory cues such as a calendar on the refrigerator or appointments with alarm, such as on a cell phone, can also help maximize recall.    To address problems with processing speed, he may wish to consider:   -Ensuring that he is alerted when essential material or instructions are being presented   -Adjusting the speed at which new information is presented   -Allowing for more time in comprehending, processing, and responding in conversation  To address problems with fluctuating attention, he may wish to consider:   -Avoiding external distractions when needing to concentrate   -Limiting exposure to fast paced environments with multiple sensory demands   -Writing down complicated information and using checklists   -Attempting and completing one task at  a time (i.e., no multi-tasking)   -Verbalizing aloud each step of a task to maintain focus   -Taking frequent breaks during the completion of steps/tasks to avoid fatigue   -Reducing the amount of information considered at one time   -Scheduling more difficult activities for a  time of day where he is usually most alert  Review of Records:   [name] was seen by his PCP Cathlean Cower, M.D.) on 11/18/2019 for an unspecified illness. His friend reported that he appeared mildly confused, had felt ill for the past few days (feeling warm at times) with a non productive cough and nausea without vomiting. O2 saturation on two different oximeters was said to be less than 90%. Blood pressure was also noted to be low and [name] was said to appear weak and unsteady on his feet. It was recommended that he be seen in the ED. Subsequently, [name] was seen in the [facility] on 11/18/2019 presenting with hypotension. COVID-19 testing was performed prior to discharge. The following day, he was notified that he was COVID positive, along with having COVID-related pneumonia.   He was again seen in the [facility] ED on 11/21/2019 with trouble breathing and altered mental status. He was noted to be in acute distress, was "ill-appearing," and was "toxic-appearing." He was said to be inattentive and exhibited slowed speech and behaviors, inappropriate judgment, and cognitive impairment. He was ultimately diagnosed with hypoxia due to COVID-19 infection. He required immediate hospitalization and urgent treatment with remdesivir was initiated. His radiologist commented that symptoms were consistent with viral pneumonia/edema. He was ultimately discharged to rehab and then moved in with his sister following his eventual discharge home in October.  Mr. Gebhard was most recently seen by [facility] Ellouise Newer, M.D.) on 05/24/2020 for an evaluation of memory loss. He was accompanied by [name], who reported  that memory changes starting following his hospitalization. Prior to that, she described him as a "sharp one." Mr. Kornegay reported trouble expressing himself, sluggish thinking, and ongoing memory dysfunction which seems to come and go. Prior to his hospitalization, he was living independently and managing ADLs without reported issue. Since his hospitalization, his receives assistance with ADLs and lives with his sister. He also reported new onset of visual hallucinations which started in January 2022. He reported instances where he will see several people in his room, sometimes wearing black hats. Sometimes, an individual has tried to lay down beside him; this image would disappear after a while and there was no reported auditory component. Hallucinations were said to occur only at night. He also reported a history of mild depressive symptoms, worsened when he is tired or does not have activities to perform/complete. Performance on a brief cognitive screening instrument (MOCA) was 14/30. Ultimately, [name] was referred for a comprehensive neuropsychological evaluation to characterize his cognitive abilities and to assist with diagnostic clarity and treatment planning.   Head CT on 11/18/2019 revealed age-related cerebral atrophy, ventriculomegaly, and advanced periventricular white matter disease. Brain MRI on 11/18/2019 revealed moderate chronic microvascular ischemic changes, as well as a mild to moderate burden of chronic microhemorrhages which was said to potentially reflect chronic hypertension or amyloid angiopathy. 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   Past Medical History:  Diagnosis Date  . Acute hypoxemic respiratory failure due to COVID-19 11/18/2019  . Acute metabolic encephalopathy 21/30/8657   related to  COVID-19 infection  . Chronic constipation   . CKD (chronic kidney disease)    stage III  . Essential hypertension 02/17/2010   Previously on lisinopril-HCTZ but this was discontinued earlier this year due to angioedema. Currently on nifedipine  30 mg daily. Serial BPs: -02/03/13: 118/82.    Marland Kitchen History of COVID-19 11/19/2019  . Hyperbilirubinemia   . Hypotension 11/18/2019  . Hypoxia 11/21/2019   related to COVID-19 infection  . Leukocytosis 10/10/2019  . Major depressive disorder 12/30/2013  . Personal history of colonic polyps 12/30/2013   Per colonoscopy at [facility] per pt, about sept 2014  . PTSD (post-traumatic stress disorder)    saw combat during [place] war  . Thrombocytopenia 10/10/2019  . Tubular adenoma of colon 2014  . Visual hallucinations 03/2020  . Vitamin D deficiency 08/07/2019    Past Surgical History:  Procedure Laterality Date  . COLONOSCOPY    . MUSCLE RECESSION AND RESECTION Left 12/25/2018   Procedure: INFERIOR OBLIQUE RECESSION LEFT EYE, SUPERIOR OBLIQUE TUCK LEFT EYE;  Surgeon: Gevena Cotton, MD;  Location: [facility];  Service: Ophthalmology;  Laterality: Left;  . UPPER GASTROINTESTINAL ENDOSCOPY      Current Outpatient Medications:  .  acetaminophen (TYLENOL) 325 MG tablet, Take 2 tablets (650 mg total) by mouth every 6 (six) hours as needed for mild pain or headache (fever >/= 101)., Disp: , Rfl:  .  albuterol (VENTOLIN HFA) 108 (90 Base) MCG/ACT inhaler, Inhale 1-2 puffs into the lungs every 4 (four) hours as needed for wheezing or shortness of breath., Disp: , Rfl:  .  Cholecalciferol (VITAMIN D3) 50 MCG (2000 UT) capsule, Take 2,000 Units by mouth daily., Disp: , Rfl:  .  lactulose (CHRONULAC) 10 GM/15ML solution, Take 30 mLs (20 g total) by mouth 2 (two) times daily as needed for moderate constipation or severe constipation. (Patient not taking: Reported on 05/24/2020), Disp: 236 mL, Rfl: 0 .  losartan (COZAAR) 50 MG tablet, 1 tab by mouth,  Disp: 90 tablet, Rfl: 3 .  Multiple Vitamins-Minerals (MULTIVITAMIN WITH MINERALS) tablet, Take 1 tablet by mouth daily., Disp: , Rfl:   Clinical Interview:   The following information was obtained during a clinical interview with [name] and his accompanying guest prior to cognitive testing.  Cognitive Symptoms: Decreased short-term memory: Endorsed. He reported primary difficulties with word retrieval, stating that he has noticed trouble recalling the names of familiar individuals or words he wishes to use during conversation. He also acknowledged trouble recalling the details of conversations and occasionally misplacing things around his residence.  Decreased long-term memory: Denied. Decreased attention/concentration: Endorsed. He acknowledged trouble with sustained attention and distractibility, largely when he is overly fatigued.  Reduced processing speed: Endorsed. He reported feeling "a little foggy."  Difficulties with executive functions: Endorsed. Trouble with organization and complex planning was said to be longstanding in nature but worse since his hospitalization. He denied trouble making decisions or acting impulsivity. Overt personality changes were likewise denied.  Difficulties with emotion regulation: Denied. Difficulties with receptive language: Denied. Difficulties with word finding: Endorsed. Decreased visuoperceptual ability: Denied.  Trajectory of deficits: Deficits were said to be present since contracting COVID-19 and his subsequent hospitalization with hypoxia in August 2021. Symptoms have largely persisted since that time, but were said to sometimes "come and go." [name] repeatedly stated fatigue as a primary cause for issues he has been experiencing.   Difficulties completing ADLs: Endorsed. Prior to his COVID infection, difficulties completing ADLs independently were denied. However, since that time, he has a caretaker and receives assistance/reminders with  medication management and bill paying. He also has not resumed driving, partially due to residual double vision.   Additional Medical History: History of traumatic brain injury/concussion: Endorsed. Many  years ago, he reported falling off a ladder while working on a house, ultimately hitting his head as he hit the ground. He was unsure if he experienced a loss of consciousness, but did report experiencing a "slight convulsion" which was said to last about three minutes. Persisting cognitive symptoms were not endorsed following this event and no other potential head injuries were reported. History of stroke: Denied. History of seizure activity: Denied outside of his report of "slight convulsions" following his fall.  History of known exposure to toxins: Medical records suggest possible exposure to Agent Orange while he was serving in the [place] military during the [place] War.  Symptoms of chronic pain: Endorsed. He primarily reported arthritic symptoms involving his buttocks which makes it uncomfortable for him to sit for long periods of time.  Experience of frequent headaches/migraines: Denied. Frequent instances of dizziness/vertigo: Endorsed. Symptoms were said to occur after standing quickly and not giving himself time to stabilize himself.   Sensory changes: He reported symptoms of horizontal diplopia. He has seen his eye doctor who provided him a new corrective lens prescription. However, he still experiences binocular diplopia when utilizes these glasses and has this been somewhat resistant to fully adapting their use. Other sensory changes/difficulties (e.g., hearing, taste, or smell) were denied.  Balance/coordination difficulties: Endorsed. Balance instability was particularly noteworthy while hospitalized with COVID-19. Since that time and subsequent rehab treatment, he reported that his balance has improved. He noted that he still does not have "that spring back ability" that he once had but  denied any falls since his hospitalization. They also noted that he was seeing a chiropractor prior to his illness with some benefit.  Other motor difficulties: Denied. Upper-extremity tremors were only said to emerge after consuming larger amounts of caffeine.   Sleep History: Estimated hours obtained each night: 5 hours.  Difficulties falling asleep: Endorsed. These were said to occur sometimes where it takes him a bit of time before being able to fall asleep.  Difficulties staying asleep: Denied. Feels rested and refreshed upon awakening: Endorsed for the most part.   History of snoring: Denied. History of waking up gasping for air: Endorsed. However, this was only endorsed around the time of his illness and hospitalization.  Witnessed breath cessation while asleep: Denied.  History of vivid dreaming: Endorsed. Symptoms were attributed to recurring and distressing nightmares following his discharge from the [place] [employer]. He reported seeing combat during the [place] War era.  Excessive movement while asleep: Denied. Instances of acting out his dreams: Denied.  Psychiatric/Behavioral Health History: Depression: Endorsed. He reported a longstanding history of generally mild symptoms of depression. He reported prior medication intervention which was helpful. Currently, he stated that he keeps this medication "on stand-by" in case he needs it. He described his current mood as "pretty good" and denied current or remote suicidal ideation, intent, or plan.  Anxiety: Denied. Mania: Denied. Trauma History: Endorsed. As stated above, he saw combat with serving in the [place] military during the [place] War. He acknowledged being previously diagnosed with PTSD and has experienced vivid nightmares and flashbacks over the years. Currently, these symptoms were not said to significantly impact his current functioning outside of making him somewhat temperamental at times.  Visual/auditory hallucinations: Endorsed.  He reported three instances over the past 2-3 months where he has seen various fully-formed individuals in his bedroom. One of these individuals was said to attempt to lie next to him in bed. No auditory component of these hallucinations was said to  be present. No symptoms were said to be present prior to the past few months.  Delusional thoughts: Denied.  Tobacco: Denied. Alcohol: He denied current alcohol consumption as well as a history of problematic alcohol abuse or dependence. Recreational drugs: Denied. Caffeine: A couple cups of coffee in the morning.   Family History: Problem Relation Age of Onset  . Hypertension Other   . Colon cancer Neg Hx   . Esophageal cancer Neg Hx   . Stomach cancer Neg Hx   . Rectal cancer Neg Hx    This information was confirmed by [name].  Academic/Vocational History: Highest level of educational attainment: 15 years. He reported completing high school and being a few credits short of earning his Bachelor's degree. He described himself as an average (B/C) student in academic settings. Math was noted as a likely relative weakness.  History of developmental delay: Denied. History of grade repetition: Denied. Enrollment in special education courses: Denied. History of LD/ADHD: Denied.  Employment: He is currently on an extended leave due to his hospitalization and subsequent deficits. He previously worked as an Research scientist (physical sciences) at [employer].   Evaluation Results:   Behavioral Observations: [name] was accompanied, arrived to his appointment on time, and was appropriately dressed and groomed. He appeared alert and oriented. Observed gait and station were within normal limits. Gross motor functioning appeared intact upon informal observation and no abnormal movements (e.g., tremors) were noted. His affect was generally relaxed and positive, but did range appropriately given the subject being discussed during the clinical  interview or the task at hand during testing procedures. He was soft-spoken and exhibited a very mild delay when answering questions during interview. Despite this, spontaneous speech was generally fluent. Mild word finding difficulties were observed. Thought processes were coherent, organized, and normal in content. Insight into his cognitive difficulties appeared adequate. During testing, [name] appeared very fatigued and the psychometrist expressed concerns that he was nodding off at several times during the evaluation, both in the beginning and towards the end. A delayed response latency was noted. Visual disturbances were also very apparent during testing, causing numerous tasks to be discontinued. Mr. Yi was noted as saying "they are moving" when referring to some visual stimuli. Sustained attention was generally appropriate. Task engagement was adequate and he persisted when challenged. Overall, [name] was cooperative with the clinical interview and subsequent testing procedures.   Adequacy of Effort: The validity of neuropsychological testing is limited by the extent to which the individual being tested may be assumed to have exerted adequate effort during testing. Mr. Crosson expressed his intention to perform to the best of his abilities and exhibited adequate task engagement and persistence. Scores across embedded performance validity measures were below expectation. One stand-alone measure was discontinued due to vision deficits. As stated above, Mr. Chiu also appeared fatigued throughout the evaluation and may have briefly nodded off a time or two. There remains the potential that below expectation performances represent true cognitive dysfunction and I do not believe that he was attempting to perform poorly across testing. However, mild caution is warranted when interpreting test results.  Test Results: Mr. Vallejo was mildly disoriented at the time of the current evaluation. He  was unable to state the current date, time, or current location.   Intellectual abilities based upon educational and vocational attainment were estimated to be in the average range. Premorbid abilities were estimated to be within the lower limits of the average range  based upon a single-word reading test.   Processing speed was generally unable to be assessed due to complaints of double vision. He did perform in the average range across a verbalized numerical sequencing task. Basic attention was variable, ranging from the well below average to average normative ranges. More complex attention (e.g., working memory) was well below average. Executive functioning was exceptionally low to well below average. He did perform in the below average range across a task assessing safety and judgment.  Assessed receptive language abilities were exceptionally low. He appeared to exhibit greatest difficulty with sequencing, understanding more complex sentence structure, and completing multi-step commands. Assessed expressive language (e.g., verbal fluency and confrontation naming) was mildly variable. Phonemic fluency was well below average to below average, semantic fluency was exceptionally low to well below average, and confrontation naming was average across a screening measure but exceptionally low across a more comprehensive assessment.     Assessed visuospatial/visuoconstructional abilities were variable, often impacted by the significance of ongoing double vision causing Mr. Coltrane to commonly comment that visual stimuli were "moving." Double vision was especially exhibited during his drawing of a clock as he appeared to draw duplicates of certain numbers as he was seeing them twice on his sheet of paper. Points were ultimately lost due to the duplication of several numbers, him omitting several numbers altogether, and there being no size differentiation between clock hands. He was able to perform in the average  range when copying a complex figure.    Learning (i.e., encoding) of novel verbal information was exceptionally low to well below average. Spontaneous delayed recall (i.e., retrieval) of previously learned information was also exceptionally low to well below average. Retention rates were 100% (raw score of 3) across a story learning task (all remembered details related to "fire" which was provided to him in the instruction prompt), 0% across a list learning task, and 24% across a figure drawing task. Performance across recognition tasks was below average across a story learning task but exceptionally low to well below average across other tasks, suggesting limited evidence for information consolidation.   Results of emotional screening instruments suggested that recent symptoms of generalized anxiety were in the mild range, while symptoms of depression were within normal limits. A screening instrument assessing recent sleep quality suggested the presence of mild sleep dysfunction.  Tables of Scores:   Note: This summary of test scores accompanies the interpretive report and should not be considered in isolation without reference to the appropriate sections in the text. Descriptors are based on appropriate normative data and may be adjusted based on clinical judgment. The terms "impaired" and "within normal limits (WNL)" are used when a more specific level of functioning cannot be determined.       Effort Testing:   DESCRIPTOR       Dot Counting Test: --- --- Discontinued (vision)  RBANS Effort Index: --- --- Below Expectation  WAIS-IV Reliable Digit Span: --- --- Below Expectation       Orientation:      Raw Score Percentile   NAB Orientation, Form 1 24/29 --- ---       Cognitive Screening:           Raw Score Percentile   SLUMS: 11/30 --- ---       RBANS, Form A: Standard Score/ Scaled Score Percentile   Total Score 55 <1 Exceptionally Low  Immediate Memory 57 <1 Exceptionally Low     List Learning 4 2 Well Below Average  Story Memory 2 <1 Exceptionally Low  Visuospatial/Constructional 69 2 Exceptionally Low    Figure Copy 9 37 Average    Line Orientation 0/20 <2 Exceptionally Low  Language 78 7 Well Below Average    Picture Naming 10/10 51-75 Average    Semantic Fluency 2 <1 Exceptionally Low  Attention --- --- ---    Digit Span 10 50 Average    Coding Discontinued (vision) --- ---  Delayed Memory 44 <1 Exceptionally Low    List Recall 0/10 <2 Exceptionally Low    List Recognition 12/20 <2 Exceptionally Low    Story Recall 4 2 Well Below Average    Story Recognition 8/12 8-15 Below Average    Figure Recall 4 2 Well Below Average    Figure Recognition 3/8 4-8 Well Below Average       Intellectual Functioning:           Standard Score Percentile   Test of Premorbid Functioning: 90 25 Average       Attention/Executive Function:          Trail Making Test (TMT): Raw Score (T Score) Percentile     Part A Discontinued (vision) --- ---    Part B --- --- ---       Oral Trail Making Test (OTMT): Raw Score (Z-Score) Percentile     Part A 7 secs.,  0 errors (0.12) 55 Average    Part B 300 secs.,  5 errors (-14.75) <1 Exceptionally Low        Scaled Score Percentile   WAIS-IV Digit Span: 3 1 Exceptionally Low    Forward 4 2 Well Below Average    Backward 5 5 Well Below Average    Sequencing 4 2 Well Below Average        Scaled Score Percentile   WAIS-IV Similarities: 4 2 Well Below Average       D-KEFS Color-Word Interference Test: Raw Score (Scaled Score) Percentile     Color Naming Discontinued (vision) --- ---    Word Reading --- --- ---    Inhibition --- --- ---    Inhibition/Switching --- --- ---       D-KEFS Verbal Fluency Test: Raw Score (Scaled Score) Percentile     Letter Total Correct 18 (5) 5 Well Below Average    Category Total Correct 14 (2) <1 Exceptionally Low    Category Switching Total Correct 4 (1) <1 Exceptionally Low    Category  Switching Accuracy 2 (1) <1 Exceptionally Low      Total Set Loss Errors 6 (5) 5 Well Below Average      Total Repetition Errors 6 (7) 16 Below Average       NAB Executive Functions Module, Form 1: T Score Percentile     Judgment 40 16 Below Average       Language:          Verbal Fluency Test: Raw Score (T Score) Percentile     Phonemic Fluency (FAS) 18 (38) 12 Below Average    Animal Fluency 8 (34) 5 Well Below Average        NAB Language Module, Form 1: T Score Percentile     Auditory Comprehension 19 <1 Exceptionally Low    Naming 23/31 (26) 1 Exceptionally Low       Visuospatial/Visuoconstruction:      Raw Score Percentile   Clock Drawing: 6/10 --- Impaired       Mood and Personality:      Raw  Score Percentile   Geriatric Depression Scale: 6 --- Within Normal Limits  Geriatric Anxiety Scale: 18 --- Mild    Somatic 7 --- Mild    Cognitive 6 --- Mild    Affective 5 --- Mild       Additional Questionnaires:      Raw Score Percentile   PROMIS Sleep Disturbance Questionnaire: 29 --- Mild   Informed Consent and Coding/Compliance:   The current evaluation represents a clinical evaluation for the purposes previously outlined by the referral source and is in no way reflective of a forensic evaluation.   Mr. Hobin was provided with a verbal description of the nature and purpose of the present neuropsychological evaluation. Also reviewed were the foreseeable risks and/or discomforts and benefits of the procedure, limits of confidentiality, and mandatory reporting requirements of this provider. The patient was given the opportunity to ask questions and receive answers about the evaluation. Oral consent to participate was provided by the patient.   This evaluation was conducted by Christia Reading, Ph.D., licensed clinical neuropsychologist. Mr. Trostle completed a clinical interview with Dr. Melvyn Novas, billed as one unit [phone], and 145 minutes of cognitive testing and scoring, billed as  one unit [phone] and four additional units [id number]. Psychometrist Milana Kidney, B.S., assisted Dr. Melvyn Novas with test administration and scoring procedures. As a separate and discrete service, Dr. Melvyn Novas spent a total of 160 minutes in interpretation and report writing billed as one unit [phone] and two units [id number].

## 2020-06-15 ENCOUNTER — Encounter: Payer: BC Managed Care – PPO | Admitting: Psychology

## 2020-06-21 ENCOUNTER — Encounter: Payer: Self-pay | Admitting: Psychology

## 2020-06-21 ENCOUNTER — Ambulatory Visit (INDEPENDENT_AMBULATORY_CARE_PROVIDER_SITE_OTHER): Payer: BC Managed Care – PPO | Admitting: Psychology

## 2020-06-21 ENCOUNTER — Other Ambulatory Visit: Payer: Self-pay

## 2020-06-21 DIAGNOSIS — I69919 Unspecified symptoms and signs involving cognitive functions following unspecified cerebrovascular disease: Secondary | ICD-10-CM

## 2020-06-21 DIAGNOSIS — U071 COVID-19: Secondary | ICD-10-CM | POA: Diagnosis not present

## 2020-06-21 DIAGNOSIS — J9601 Acute respiratory failure with hypoxia: Secondary | ICD-10-CM | POA: Diagnosis not present

## 2020-06-21 DIAGNOSIS — Z8616 Personal history of COVID-19: Secondary | ICD-10-CM

## 2020-06-21 NOTE — Patient Instructions (Signed)
A repeat neuropsychological evaluation in 12 months is recommended to assess the trajectory of cognitive abilities. Ideally, this would be performed when fatigue and visual disturbances have been improved to allow for added clarity regarding ongoing cognitive dysfunction. It would be of benefit to stretch his next testing appointment into two smaller sessions rather than one larger session to help limit the impact of fatigue.   Mr. Gerstenberger is encouraged to attend to lifestyle factors for brain health (e.g., regular physical exercise, good nutrition habits, regular participation in cognitively-stimulating activities, and general stress management techniques), which are likely to have benefits for both emotional adjustment and cognition. In fact, in addition to promoting good general health, regular exercise incorporating aerobic activities (e.g., brisk walking, jogging, cycling, etc.) has been demonstrated to be a very effective treatment for depression and stress, with similar efficacy rates to both antidepressant medication and psychotherapy. Optimal control of vascular risk factors (including safe cardiovascular exercise and adherence to dietary recommendations) is encouraged.   While performance across neurocognitive testing is not a strong predictor of an individual's safety operating a motor vehicle. I do agree with his continued driving abstinence. Should he or his his family wish to pursue a formalized driving evaluation, they would be encouraged to [employer] [facility] in [place], [place] at [phone].Another option would be through [facility]; however, the latter would likely require a referral from a medical doctor. [facility] can be reached directly at [phone].  Should there be a progression of his current deficits over time, Mr. Bann is unlikely to regain any independent living skills lost. Therefore, it is recommended that he remain as involved as possible  in all aspects of household chores, finances, and medication management, with supervision to ensure adequate performance. He will likely benefit from the establishment and maintenance of a routine in order to maximize his functional abilities over time.  It will be important for Mr. Clink to have another person with him when in situations where he may need to process information, weigh the pros and cons of different options, and make decisions, in order to ensure that he fully understands and recalls all information to be considered.  When learning new information, he would benefit from information being broken up into small, manageable pieces. He may also find it helpful to articulate the material in his own words and in a context to promote encoding at the onset of a new task. This material may need to be repeated multiple times to promote encoding.  Memory can be improved using internal strategies such as rehearsal, repetition, chunking, mnemonics, association, and imagery. External strategies such as written notes in a consistently used memory journal, visual and nonverbal auditory cues such as a calendar on the refrigerator or appointments with alarm, such as on a cell phone, can also help maximize recall.    To address problems with processing speed, he may wish to consider:   -Ensuring that he is alerted when essential material or instructions are being presented   -Adjusting the speed at which new information is presented   -Allowing for more time in comprehending, processing, and responding in conversation  To address problems with fluctuating attention, he may wish to consider:   -Avoiding external distractions when needing to concentrate   -Limiting exposure to fast paced environments with multiple sensory demands   -Writing down complicated information and using checklists   -Attempting and completing one task at a time (i.e., no multi-tasking)   -Verbalizing aloud each step of a  task to maintain focus   -Taking frequent breaks during the completion of steps/tasks to avoid fatigue   -Reducing the amount of information considered at one time   -Scheduling more difficult activities for a time of day where he is usually most alert

## 2020-06-21 NOTE — Progress Notes (Signed)
   Neuropsychology Feedback Session Tillie Valdez. [facility] Department of Neurology  Reason for Referral:   [name] a 75 y.o. left-handed African-American male referred by [name], M.D.,to characterize hiscurrent cognitive functioning and assist with diagnostic clarity and treatment planning in the context of subjective cognitive decline following COVID-19 infection, as well as several positive neuroimaging findings.   Feedback:   Mr. [name] completed a comprehensive neuropsychological evaluation on 06/08/2020. Please refer to that encounter for the full report and recommendations. Mr. [name] also appeared fatigued throughout the evaluation and may have briefly nodded off a time or two during testing. There remains the potential that below expectation performances represent true cognitive dysfunction and I do not believe that he was attempting to perform poorly across testing. However, mild caution is warranted when interpreting test results.Results suggested fairly diffuse cognitive impairment. Performance variability was exhibited across basic attention, phonemic fluency, and confrontation naming. He performed well across a simple task verbally sequencing numbers, as well as when copying a complex figure; however, other visuospatial performances were impaired. Overall, impairments were seen across attention/concentration, executive functioning, receptive language, semantic fluency, and all aspects of learning and memory.  Mr. [name] was accompanied by his wife and sister during the current telephone call. They were within their respective residences while I was within my office. I discussed the limitations of evaluation and management by telemedicine and the availability of in person appointments. Mr. [name] expressed his understanding and agreed to proceed. Content of the current session focused on the results of his neuropsychological evaluation. Mr. [name]  and his family were given the opportunity to ask questions and their questions were answered. They were encouraged to reach out should additional questions arise. A copy of his report was mailed at the conclusion of the visit.      40 minutes were spent conducting the current feedback session with Mr. [name], billed as one unit [phone].

## 2020-07-07 ENCOUNTER — Encounter: Payer: BC Managed Care – PPO | Admitting: Psychology

## 2020-07-14 ENCOUNTER — Encounter: Payer: BC Managed Care – PPO | Admitting: Psychology

## 2020-07-19 ENCOUNTER — Encounter: Payer: Self-pay | Admitting: Internal Medicine

## 2020-07-19 ENCOUNTER — Other Ambulatory Visit: Payer: Self-pay

## 2020-07-19 ENCOUNTER — Ambulatory Visit: Payer: BC Managed Care – PPO | Admitting: Internal Medicine

## 2020-07-19 VITALS — BP 142/88 | HR 52 | Temp 98.3°F | Ht 67.0 in | Wt 209.0 lb

## 2020-07-19 DIAGNOSIS — I10 Essential (primary) hypertension: Secondary | ICD-10-CM | POA: Diagnosis not present

## 2020-07-19 DIAGNOSIS — M4802 Spinal stenosis, cervical region: Secondary | ICD-10-CM | POA: Diagnosis not present

## 2020-07-19 DIAGNOSIS — E559 Vitamin D deficiency, unspecified: Secondary | ICD-10-CM

## 2020-07-19 DIAGNOSIS — R441 Visual hallucinations: Secondary | ICD-10-CM | POA: Diagnosis not present

## 2020-07-19 DIAGNOSIS — N1831 Chronic kidney disease, stage 3a: Secondary | ICD-10-CM | POA: Diagnosis not present

## 2020-07-19 LAB — HEPATIC FUNCTION PANEL
ALT: 20 U/L (ref 0–53)
AST: 28 U/L (ref 0–37)
Albumin: 4.2 g/dL (ref 3.5–5.2)
Alkaline Phosphatase: 60 U/L (ref 39–117)
Bilirubin, Direct: 0.1 mg/dL (ref 0.0–0.3)
Total Bilirubin: 0.5 mg/dL (ref 0.2–1.2)
Total Protein: 7.2 g/dL (ref 6.0–8.3)

## 2020-07-19 LAB — BASIC METABOLIC PANEL
BUN: 15 mg/dL (ref 6–23)
CO2: 32 mEq/L (ref 19–32)
Calcium: 9.7 mg/dL (ref 8.4–10.5)
Chloride: 104 mEq/L (ref 96–112)
Creatinine, Ser: 1.36 mg/dL (ref 0.40–1.50)
GFR: 50.87 mL/min — ABNORMAL LOW (ref >60.00)
Glucose, Bld: 75 mg/dL (ref 70–99)
Potassium: 3.9 mEq/L (ref 3.5–5.1)
Sodium: 141 mEq/L (ref 135–145)

## 2020-07-19 LAB — URINALYSIS, ROUTINE W REFLEX MICROSCOPIC
Bilirubin Urine: NEGATIVE
Hgb urine dipstick: NEGATIVE
Ketones, ur: NEGATIVE
Leukocytes,Ua: NEGATIVE
Nitrite: NEGATIVE
RBC / HPF: NONE SEEN (ref 0–?)
Specific Gravity, Urine: 1.01 (ref 1.000–1.030)
Total Protein, Urine: NEGATIVE
Urine Glucose: NEGATIVE
Urobilinogen, UA: 0.2 (ref 0.0–1.0)
WBC, UA: NONE SEEN (ref 0–?)
pH: 6 (ref 5.0–8.0)

## 2020-07-19 LAB — CBC WITH DIFFERENTIAL/PLATELET
Basophils Absolute: 0 10*3/uL (ref 0.0–0.1)
Basophils Relative: 0.8 % (ref 0.0–3.0)
Eosinophils Absolute: 0 10*3/uL (ref 0.0–0.7)
Eosinophils Relative: 0.9 % (ref 0.0–5.0)
HCT: 39.3 % (ref 39.0–52.0)
Hemoglobin: 13 g/dL (ref 13.0–17.0)
Lymphocytes Relative: 29.3 % (ref 12.0–46.0)
Lymphs Abs: 1.1 10*3/uL (ref 0.7–4.0)
MCHC: 33 g/dL (ref 30.0–36.0)
MCV: 88.6 fl (ref 78.0–100.0)
Monocytes Absolute: 0.5 10*3/uL (ref 0.1–1.0)
Monocytes Relative: 12.2 % — ABNORMAL HIGH (ref 3.0–12.0)
Neutro Abs: 2.2 10*3/uL (ref 1.4–7.7)
Neutrophils Relative %: 56.8 % (ref 43.0–77.0)
Platelets: 123 10*3/uL — ABNORMAL LOW (ref 150.0–400.0)
RBC: 4.44 Mil/uL (ref 4.22–5.81)
RDW: 14.5 % (ref 11.5–15.5)
WBC: 3.9 10*3/uL — ABNORMAL LOW (ref 4.0–10.5)

## 2020-07-19 LAB — LIPID PANEL
Cholesterol: 150 mg/dL (ref 0–200)
HDL: 68.2 mg/dL (ref >39.00)
LDL Cholesterol: 57 mg/dL (ref 0–99)
NonHDL: 81.33
Total CHOL/HDL Ratio: 2
Triglycerides: 121 mg/dL (ref 0.0–149.0)
VLDL: 24.2 mg/dL (ref 0.0–40.0)

## 2020-07-19 LAB — VITAMIN D 25 HYDROXY (VIT D DEFICIENCY, FRACTURES): VITD: 41.45 ng/mL (ref 30.00–100.00)

## 2020-07-19 LAB — TSH: TSH: 1.53 u[IU]/mL (ref 0.35–4.50)

## 2020-07-19 MED ORDER — LOSARTAN POTASSIUM 100 MG PO TABS: 100.0000 mg | ORAL_TABLET | Freq: Every day | ORAL | 3 refills | Status: DC

## 2020-07-19 MED ORDER — QUETIAPINE FUMARATE 50 MG PO TABS: 50.0000 mg | ORAL_TABLET | Freq: Every day | ORAL | 1 refills | Status: DC

## 2020-07-19 NOTE — Patient Instructions (Signed)
Ok to increase the losartan to 100 mg per day  Please take all new medication as prescribed - the seroquel 50 mg at bedtime for sleep and hallucinations  Please continue all other medications as before, and refills have been done if requested.  Please have the pharmacy call with any other refills you may need.  Please continue your efforts at being more active, low cholesterol diet, and weight control.  Please keep your appointments with your specialists as you may have planned  Please make an Appointment to return in 6 months, or sooner if needed

## 2020-07-19 NOTE — Progress Notes (Signed)
Patient ID: [name], male   DOB: 25-May-1945, 75 y.o.   MRN: [id number]        Chief Complaint: memory changes       HPI:  [name] is a 75 y.o. male here with wife regarding ongoing memory issue, s/p neuropsych evaluation and though to have large element depression which pt denies; here with c/o worsening night time hallucinations and concerning b/c he will tend to go outside the house at night.  Denies worsening depressive symptoms, suicidal ideation, or panic;  Also had recent MRI brain with incidental noting c spine stenosis but patient denies neck pain or other focal neuro s/s worsening.  Pt denies chest pain, increased sob or doe, wheezing, orthopnea, PND, increased LE swelling, palpitations, dizziness or syncope.   Pt denies polydipsia, polyuria,  Pt denies fever, wt loss, night sweats, loss of appetite, or other constitutional symptoms No other new complaints       Wt Readings from Last 3 Encounters:  07/19/20 209 lb (94.8 kg)  05/24/20 209 lb 3.2 oz (94.9 kg)  05/06/20 207 lb (93.9 kg)   BP Readings from Last 3 Encounters:  07/19/20 (!) 142/88  05/24/20 (!) 92/56  05/06/20 128/82         Past Medical History:  Diagnosis Date  . Acute hypoxemic respiratory failure due to COVID-19 11/18/2019  . Acute metabolic encephalopathy 69/67/8938   related to COVID-19 infection  . Chronic constipation   . CKD (chronic kidney disease)    stage III  . 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 Essential hypertension 02/17/2010   Previously on lisinopril-HCTZ but this was discontinued earlier this year due to angioedema. Currently on nifedipine 30 mg daily. Serial BPs: -02/03/13: 118/82.    Marland Kitchen History of COVID-19 11/19/2019  . Hyperbilirubinemia   . Hypotension 11/18/2019  . Hypoxia 11/21/2019   related to COVID-19 infection  . Leukocytosis 10/10/2019  . Major depressive disorder 12/30/2013  . Personal history of colonic polyps 12/30/2013   Per  colonoscopy at [facility] per pt, about sept 2014  . PTSD (post-traumatic stress disorder)    saw combat during [place] war  . Thrombocytopenia 10/10/2019  . Tubular adenoma of colon 2014  . Visual hallucinations 03/2020  . Vitamin D deficiency 08/07/2019   Past Surgical History:  Procedure Laterality Date  . COLONOSCOPY    . MUSCLE RECESSION AND RESECTION Left 12/25/2018   Procedure: INFERIOR OBLIQUE RECESSION LEFT EYE, SUPERIOR OBLIQUE TUCK LEFT EYE;  Surgeon: Gevena Cotton, MD;  Location: [facility];  Service: Ophthalmology;  Laterality: Left;  . UPPER GASTROINTESTINAL ENDOSCOPY      reports that he has never smoked. He has never used smokeless tobacco. He reports that he does not drink alcohol and does not use drugs. family history includes Hypertension in an other family member. Allergies  Allergen Reactions  . Lisinopril     Angioedema? Patient reports that his tongue and mouth became very swollen, was seen at [facility] for this in 2014.    Current Outpatient Medications on File Prior to Visit  Medication Sig Dispense Refill  . acetaminophen (TYLENOL) 325 MG tablet Take 2 tablets (650 mg total) by mouth every 6 (six) hours as needed for mild pain or headache (fever >/= 101).    Marland Kitchen albuterol (VENTOLIN HFA) 108 (90 Base) MCG/ACT inhaler Inhale 1-2 puffs into the lungs every 4 (four) hours as needed for wheezing or shortness of breath.    . Cholecalciferol (VITAMIN D3) 50 MCG (  2000 UT) capsule Take 2,000 Units by mouth daily. (Patient not taking: Reported on 07/19/2020)    . lactulose (CHRONULAC) 10 GM/15ML solution Take 30 mLs (20 g total) by mouth 2 (two) times daily as needed for moderate constipation or severe constipation. (Patient not taking: No sig reported) 236 mL 0  . Multiple Vitamins-Minerals (MULTIVITAMIN WITH MINERALS) tablet Take 1 tablet by mouth daily. (Patient not taking: Reported on 07/19/2020)     No current facility-administered medications on file prior to visit.         ROS:  All others reviewed and negative.  Objective        PE:  BP (!) 142/88 (BP Location: Left Arm, Patient Position: Sitting, Cuff Size: Large)   Pulse (!) 52   Temp 98.3 F (36.8 C) (Oral)   Ht 5\' 7"  (1.702 m)   Wt 209 lb (94.8 kg)   SpO2 99%   BMI 32.73 kg/m                 Constitutional: Pt appears in NAD               HENT: Head: NCAT.                Right Ear: External ear normal.                 Left Ear: External ear normal.                Eyes: . Pupils are equal, round, and reactive to light. Conjunctivae and EOM are normal               Nose: without d/c or deformity               Neck: Neck supple. Gross normal ROM               Cardiovascular: Normal rate and regular rhythm.                 Pulmonary/Chest: Effort normal and breath sounds without rales or wheezing.                Abd:  Soft, NT, ND, + BS, no organomegaly               Neurological: Pt is alert. At baseline orientation, motor grossly intact               Skin: Skin is warm. No rashes, no other new lesions, LE edema - none               Psychiatric: Pt behavior is normal without agitation   Micro: none  Cardiac tracings I have personally interpreted today:  none  Pertinent Radiological findings (summarize): MRI brain May 27, 2020 IMPRESSION: 1. No acute intracranial abnormality. 2. Moderate chronic microvascular ischemic change. 3. Moderate scattered microhemorrhages, which may reflect chronic hypertension or amyloid angiopathy. 4. The midbrain to pons ratio is approximately 0.16, which is decreased and borderline for progressive supranuclear palsy. Recommend clinical correlation. 5. Upper cervical spine degenerative change with suspected at least moderate canal stenosis at C3-C4. An MRI of the cervical spine could further characterize if clinically indicated.   Lab Results  Component Value Date   WBC 3.9 (L) 07/19/2020   HGB 13.0 07/19/2020   HCT 39.3 07/19/2020   PLT 123.0 (L)  07/19/2020   GLUCOSE 75 07/19/2020   CHOL 150 07/19/2020   TRIG 121.0 07/19/2020   HDL 68.20 07/19/2020  LDLCALC 57 07/19/2020   ALT 20 07/19/2020   AST 28 07/19/2020   NA 141 07/19/2020   K 3.9 07/19/2020   CL 104 07/19/2020   CREATININE 1.36 07/19/2020   BUN 15 07/19/2020   CO2 32 07/19/2020   TSH 1.53 07/19/2020   PSA 0.87 04/01/2020   Assessment/Plan:  [name] is a 75 y.o. Black or African American [2] male with  has a past medical history of Acute hypoxemic respiratory failure due to COVID-19 (32/35/5732), Acute metabolic encephalopathy (20/25/4270), Chronic constipation, CKD (chronic kidney disease), Cognitive deficits following cerebrovascular disease (06/08/2020), Essential hypertension (02/17/2010), History of COVID-19 (11/19/2019), Hyperbilirubinemia, Hypotension (11/18/2019), Hypoxia (11/21/2019), Leukocytosis (10/10/2019), Major depressive disorder (12/30/2013), Personal history of colonic polyps (12/30/2013), PTSD (post-traumatic stress disorder), Thrombocytopenia (10/10/2019), Tubular adenoma of colon (2014), Visual hallucinations (03/2020), and Vitamin D deficiency (08/07/2019).  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  losartan (COZAAR) 100 MG tablet, Take 1 tablet (100 mg total) by mouth daily.  Current Outpatient Medications (Respiratory):  .  albuterol (VENTOLIN HFA)  108 (90 Base) MCG/ACT inhaler, Inhale 1-2 puffs into the lungs every 4 (four) hours as needed for wheezing or shortness of breath.  Current Outpatient Medications (Analgesics):  .  acetaminophen (TYLENOL) 325 MG tablet, Take 2 tablets (650 mg total) by mouth every 6 (six) hours as needed for mild pain or headache (fever >/= 101).   Current Outpatient Medications (Other):  Marland Kitchen  QUEtiapine (SEROQUEL) 50 MG tablet, Take 1 tablet (50 mg total) by mouth at bedtime. .  Cholecalciferol (VITAMIN D3) 50 MCG (2000 UT) capsule, Take 2,000 Units by mouth daily. (Patient not taking: Reported on 07/19/2020) .  lactulose (CHRONULAC) 10 GM/15ML solution, Take 30 mLs (20 g total) by mouth 2 (two) times daily as needed for moderate constipation or severe constipation. (Patient not taking: No sig reported) .  Multiple Vitamins-Minerals (MULTIVITAMIN WITH MINERALS) tablet, Take 1 tablet by mouth daily. (Patient not taking: Reported on 07/19/2020)   CKD (chronic kidney disease), stage III Lab Results  Component Value Date   CREATININE 1.36 07/19/2020   Stable overall, cont to avoid nephrotoxins   Followup: Return in about 6 months (around 01/18/2021).  Cathlean Cower, MD 07/24/2020 9:54 PM [facility]

## 2020-07-24 ENCOUNTER — Encounter: Payer: Self-pay | Admitting: Internal Medicine

## 2020-07-24 NOTE — Assessment & Plan Note (Signed)
Incidental, denies neck pain or neuro symtpoms, declines need for f/u c spine mri or surgical referral

## 2020-07-24 NOTE — Assessment & Plan Note (Signed)
Last vitamin D Lab Results  Component Value Date   VD25OH 41.45 07/19/2020   Stable, cont oral replacement

## 2020-07-24 NOTE — Assessment & Plan Note (Signed)
Lab Results  Component Value Date   CREATININE 1.36 07/19/2020   Stable overall, cont to avoid nephrotoxins

## 2020-07-24 NOTE — Assessment & Plan Note (Signed)
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

## 2020-07-24 NOTE — Assessment & Plan Note (Signed)
BP Readings from Last 3 Encounters:  07/19/20 (!) 142/88  05/24/20 (!) 92/56  05/06/20 128/82   Uncontrolled, pt to increase losartan 100 qd   Current Outpatient Medications (Cardiovascular):  .  losartan (COZAAR) 100 MG tablet, Take 1 tablet (100 mg total) by mouth daily.  Current Outpatient Medications (Respiratory):  .  albuterol (VENTOLIN HFA) 108 (90 Base) MCG/ACT inhaler, Inhale 1-2 puffs into the lungs every 4 (four) hours as needed for wheezing or shortness of breath.  Current Outpatient Medications (Analgesics):  .  acetaminophen (TYLENOL) 325 MG tablet, Take 2 tablets (650 mg total) by mouth every 6 (six) hours as needed for mild pain or headache (fever >/= 101).   Current Outpatient Medications (Other):  Marland Kitchen  QUEtiapine (SEROQUEL) 50 MG tablet, Take 1 tablet (50 mg total) by mouth at bedtime. .  Cholecalciferol (VITAMIN D3) 50 MCG (2000 UT) capsule, Take 2,000 Units by mouth daily. (Patient not taking: Reported on 07/19/2020) .  lactulose (CHRONULAC) 10 GM/15ML solution, Take 30 mLs (20 g total) by mouth 2 (two) times daily as needed for moderate constipation or severe constipation. (Patient not taking: No sig reported) .  Multiple Vitamins-Minerals (MULTIVITAMIN WITH MINERALS) tablet, Take 1 tablet by mouth daily. (Patient not taking: Reported on 07/19/2020)

## 2020-07-29 ENCOUNTER — Telehealth: Payer: Self-pay | Admitting: Internal Medicine

## 2020-07-29 NOTE — Telephone Encounter (Signed)
No we are unable to extend further if his "memory" condition is not improved by now

## 2020-07-29 NOTE — Telephone Encounter (Signed)
I received short - term disability renewal forms.  Last I see it had patient returning to work in 05/2020. LOV: 07/19/2020 Did you extend patient out of work further.

## 2020-07-29 NOTE — Telephone Encounter (Signed)
Extend to 08/15/20 on January visit. Are you going to extend pass that?

## 2020-08-02 ENCOUNTER — Telehealth: Payer: Self-pay | Admitting: Internal Medicine

## 2020-08-02 NOTE — Telephone Encounter (Signed)
[employer]   Caller states his blood pressure is 180/111. His left foot is swollen.   Advised to be seen within 4 hours by PCP. Patient understood and agreed.

## 2020-08-02 NOTE — Telephone Encounter (Signed)
Patient is doing a little better and states needs a little more time out. He is requesting to be out until 11/15/2020.

## 2020-08-02 NOTE — Telephone Encounter (Signed)
LVM to inform patient Dr.John is not extending pass 08/15/20.

## 2020-08-02 NOTE — Telephone Encounter (Signed)
Sorry no, I cannot extend further as I dont have any reasonable expectation his condition will improve to the point he will be able to go back to work

## 2020-08-03 ENCOUNTER — Encounter: Payer: Self-pay | Admitting: Internal Medicine

## 2020-08-03 ENCOUNTER — Other Ambulatory Visit: Payer: Self-pay

## 2020-08-03 ENCOUNTER — Ambulatory Visit: Payer: BC Managed Care – PPO | Admitting: Internal Medicine

## 2020-08-03 VITALS — BP 118/78 | HR 65 | Temp 97.7°F | Ht 67.0 in | Wt 204.0 lb

## 2020-08-03 DIAGNOSIS — I69919 Unspecified symptoms and signs involving cognitive functions following unspecified cerebrovascular disease: Secondary | ICD-10-CM | POA: Diagnosis not present

## 2020-08-03 DIAGNOSIS — N1831 Chronic kidney disease, stage 3a: Secondary | ICD-10-CM

## 2020-08-03 DIAGNOSIS — F5101 Primary insomnia: Secondary | ICD-10-CM | POA: Diagnosis not present

## 2020-08-03 DIAGNOSIS — I10 Essential (primary) hypertension: Secondary | ICD-10-CM | POA: Diagnosis not present

## 2020-08-03 DIAGNOSIS — K5909 Other constipation: Secondary | ICD-10-CM | POA: Diagnosis not present

## 2020-08-03 DIAGNOSIS — G4700 Insomnia, unspecified: Secondary | ICD-10-CM | POA: Insufficient documentation

## 2020-08-03 MED ORDER — HYDROCHLOROTHIAZIDE 12.5 MG PO TABS: 12.5000 mg | ORAL_TABLET | Freq: Every day | ORAL | 3 refills | Status: DC

## 2020-08-03 NOTE — Patient Instructions (Signed)
Your BP is much improved  Ok to take OTC Miralax as needed for constipation  Please continue all other medications as before, and refills have been done if requested.  Please have the pharmacy call with any other refills you may need.  Please continue your efforts at being more active, low cholesterol diet, and weight control.  Please keep your appointments with your specialists as you may have planned  Please make an Appointment to return in 6 months, or sooner if needed

## 2020-08-03 NOTE — Telephone Encounter (Signed)
Patient has a visit and the below concerns will be addressed.

## 2020-08-03 NOTE — Progress Notes (Signed)
Patient ID: [name], male   DOB: 26-Jul-1945, 75 y.o.   MRN: [id number]        Chief Complaint: follow up HTN, hallucinations and cognitive decline, insomnia and constipation       HPI:  [name] is a 75 y.o. male here to f/u BP after recently seen at [facility] and started HCT 12.5 qd, tolerating well, and BP improved.  Cognitive impairment persists and hallucinations improved with current meds and tolerating well.  Wife asks for work note extension to august, and appears to have unrealistic expectation regarding his ability to improve enough to go back to employment.  Has ongoing insomnia, likewise improved with current med tx.as per last visit.  Also has ongoing constipation mildly worse since starting the HCT.  Pt denies chest pain, increased sob or doe, wheezing, orthopnea, PND, increased LE swelling, palpitations, dizziness or syncope.  Denies new focal neuro s/s.   Pt denies polydipsia, polyuria,  Pt denies fever, wt loss, night sweats, loss of appetite, or other constitutional symptoms  No other new complaints       Wt Readings from Last 3 Encounters:  08/03/20 204 lb (92.5 kg)  07/19/20 209 lb (94.8 kg)  05/24/20 209 lb 3.2 oz (94.9 kg)   BP Readings from Last 3 Encounters:  08/03/20 118/78  07/19/20 (!) 142/88  05/24/20 (!) 92/56         Past Medical History:  Diagnosis Date  . Acute hypoxemic respiratory failure due to COVID-19 11/18/2019  . Acute metabolic encephalopathy 40/98/1191   related to COVID-19 infection  . Chronic constipation   . CKD (chronic kidney disease)    stage III  . 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 Essential hypertension 02/17/2010   Previously on lisinopril-HCTZ but this was discontinued earlier this year due to angioedema. Currently on nifedipine 30 mg daily. Serial BPs: -02/03/13: 118/82.    Marland Kitchen History of COVID-19 11/19/2019  . Hyperbilirubinemia   . Hypotension 11/18/2019  . Hypoxia 11/21/2019   related to  COVID-19 infection  . Leukocytosis 10/10/2019  . Major depressive disorder 12/30/2013  . Personal history of colonic polyps 12/30/2013   Per colonoscopy at [facility] per pt, about sept 2014  . PTSD (post-traumatic stress disorder)    saw combat during [place] war  . Thrombocytopenia 10/10/2019  . Tubular adenoma of colon 2014  . Visual hallucinations 03/2020  . Vitamin D deficiency 08/07/2019   Past Surgical History:  Procedure Laterality Date  . COLONOSCOPY    . MUSCLE RECESSION AND RESECTION Left 12/25/2018   Procedure: INFERIOR OBLIQUE RECESSION LEFT EYE, SUPERIOR OBLIQUE TUCK LEFT EYE;  Surgeon: Gevena Cotton, MD;  Location: [facility];  Service: Ophthalmology;  Laterality: Left;  . UPPER GASTROINTESTINAL ENDOSCOPY      reports that he has never smoked. He has never used smokeless tobacco. He reports that he does not drink alcohol and does not use drugs. family history includes Hypertension in an other family member. Allergies  Allergen Reactions  . Lisinopril     Angioedema? Patient reports that his tongue and mouth became very swollen, was seen at [facility] for this in 2014.    Current Outpatient Medications on File Prior to Visit  Medication Sig Dispense Refill  . acetaminophen (TYLENOL) 325 MG tablet Take 2 tablets (650 mg total) by mouth every 6 (six) hours as needed for mild pain or headache (fever >/= 101).    Marland Kitchen albuterol (VENTOLIN HFA) 108 (90 Base) MCG/ACT inhaler Inhale 1-2  puffs into the lungs every 4 (four) hours as needed for wheezing or shortness of breath.    . Cholecalciferol (VITAMIN D3) 50 MCG (2000 UT) capsule Take 2,000 Units by mouth daily.    Marland Kitchen lactulose (CHRONULAC) 10 GM/15ML solution Take 30 mLs (20 g total) by mouth 2 (two) times daily as needed for moderate constipation or severe constipation. 236 mL 0  . losartan (COZAAR) 100 MG tablet Take 1 tablet (100 mg total) by mouth daily. 90 tablet 3  . Multiple Vitamins-Minerals (MULTIVITAMIN WITH MINERALS)  tablet Take 1 tablet by mouth daily.    . QUEtiapine (SEROQUEL) 50 MG tablet Take 1 tablet (50 mg total) by mouth at bedtime. 90 tablet 1   No current facility-administered medications on file prior to visit.        ROS:  All others reviewed and negative.  Objective        PE:  BP 118/78 (BP Location: Left Arm, Patient Position: Sitting, Cuff Size: Large)   Pulse 65   Temp 97.7 F (36.5 C) (Oral)   Ht 5\' 7"  (1.702 m)   Wt 204 lb (92.5 kg)   SpO2 98%   BMI 31.95 kg/m                 Constitutional: Pt appears in NAD               HENT: Head: NCAT.                Right Ear: External ear normal.                 Left Ear: External ear normal.                Eyes: . Pupils are equal, round, and reactive to light. Conjunctivae and EOM are normal               Nose: without d/c or deformity               Neck: Neck supple. Gross normal ROM               Cardiovascular: Normal rate and regular rhythm.                 Pulmonary/Chest: Effort normal and breath sounds without rales or wheezing.                Abd:  Soft, NT, ND, + BS, no organomegaly               Neurological: Pt is alert. At baseline orientation, motor grossly intact               Skin: Skin is warm. No rashes, no other new lesions, LE edema - none               Psychiatric: Pt behavior is normal without agitation   Micro: none  Cardiac tracings I have personally interpreted today:  none  Pertinent Radiological findings (summarize): none   Lab Results  Component Value Date   WBC 3.9 (L) 07/19/2020   HGB 13.0 07/19/2020   HCT 39.3 07/19/2020   PLT 123.0 (L) 07/19/2020   GLUCOSE 75 07/19/2020   CHOL 150 07/19/2020   TRIG 121.0 07/19/2020   HDL 68.20 07/19/2020   LDLCALC 57 07/19/2020   ALT 20 07/19/2020   AST 28 07/19/2020   NA 141 07/19/2020   K 3.9 07/19/2020   CL 104 07/19/2020   CREATININE 1.36  07/19/2020   BUN 15 07/19/2020   CO2 32 07/19/2020   TSH 1.53 07/19/2020   PSA 0.87 04/01/2020    Assessment/Plan:  [name] is a 75 y.o. Black or African American [2] male with  has a past medical history of Acute hypoxemic respiratory failure due to COVID-19 (98/26/4158), Acute metabolic encephalopathy (30/94/0768), Chronic constipation, CKD (chronic kidney disease), Cognitive deficits following cerebrovascular disease (06/08/2020), Essential hypertension (02/17/2010), History of COVID-19 (11/19/2019), Hyperbilirubinemia, Hypotension (11/18/2019), Hypoxia (11/21/2019), Leukocytosis (10/10/2019), Major depressive disorder (12/30/2013), Personal history of colonic polyps (12/30/2013), PTSD (post-traumatic stress disorder), Thrombocytopenia (10/10/2019), Tubular adenoma of colon (2014), Visual hallucinations (03/2020), and Vitamin D deficiency (08/07/2019).  Cognitive deficits following cerebrovascular disease Chronic persistent, not expected to improve, pt not expected to return to employment, I declined to sign any work extension and wife is dissapointed   Essential hypertension Improved now  BP Readings from Last 3 Encounters:  08/03/20 118/78  07/19/20 (!) 142/88  05/24/20 (!) 92/56   Stable, pt to continue medical treatment hct, losartan   Chronic constipation To add miralax daily prn,  to f/u any worsening symptoms or concerns  CKD (chronic kidney disease), stage III Lab Results  Component Value Date   CREATININE 1.36 07/19/2020   Stable overall, cont to avoid nephrotoxins   Insomnia Improved, cont current tx - seroquel   Followup: Return in about 6 months (around 02/02/2021).  Cathlean Cower, MD 08/09/2020 9:05 PM [facility]

## 2020-08-04 NOTE — Telephone Encounter (Signed)
Please return call to patient, advised of previous message regarding return date

## 2020-08-04 NOTE — Telephone Encounter (Signed)
LVM again for patient.

## 2020-08-04 NOTE — Telephone Encounter (Signed)
LVM to inform patient the forms can not be extended further. 08/15/20 is the return date.

## 2020-08-05 NOTE — Telephone Encounter (Signed)
Patient called back and has been informed of information below. Forms have been faxed to [employer], Original mailed to patient.

## 2020-08-09 ENCOUNTER — Encounter: Payer: Self-pay | Admitting: Internal Medicine

## 2020-08-09 NOTE — Assessment & Plan Note (Signed)
Chronic persistent, not expected to improve, pt not expected to return to employment, I declined to sign any work extension and wife is dissapointed

## 2020-08-09 NOTE — Assessment & Plan Note (Signed)
Lab Results  Component Value Date   CREATININE 1.36 07/19/2020   Stable overall, cont to avoid nephrotoxins  

## 2020-08-09 NOTE — Assessment & Plan Note (Signed)
Improved, cont current tx - seroquel

## 2020-08-09 NOTE — Assessment & Plan Note (Signed)
Improved now  BP Readings from Last 3 Encounters:  08/03/20 118/78  07/19/20 (!) 142/88  05/24/20 (!) 92/56   Stable, pt to continue medical treatment hct, losartan

## 2020-08-09 NOTE — Assessment & Plan Note (Signed)
To add miralax daily prn,  to f/u any worsening symptoms or concerns

## 2020-08-16 ENCOUNTER — Ambulatory Visit: Payer: BC Managed Care – PPO | Admitting: Neurology

## 2020-09-01 ENCOUNTER — Telehealth: Payer: Self-pay | Admitting: Internal Medicine

## 2020-09-01 NOTE — Telephone Encounter (Signed)
   Patient requesting refill for hydrochlorothiazide (HYDRODIURIL) 12.5 MG tablet  Pharmacy [facility] [place], [place]

## 2020-09-30 ENCOUNTER — Ambulatory Visit: Payer: BC Managed Care – PPO | Admitting: Internal Medicine

## 2020-12-22 ENCOUNTER — Other Ambulatory Visit: Payer: Self-pay

## 2020-12-22 ENCOUNTER — Encounter: Payer: Self-pay | Admitting: Internal Medicine

## 2020-12-22 ENCOUNTER — Ambulatory Visit (INDEPENDENT_AMBULATORY_CARE_PROVIDER_SITE_OTHER): Payer: BC Managed Care – PPO | Admitting: Internal Medicine

## 2020-12-22 VITALS — BP 122/70 | HR 60 | Temp 98.2°F | Ht 67.0 in | Wt 210.0 lb

## 2020-12-22 DIAGNOSIS — R739 Hyperglycemia, unspecified: Secondary | ICD-10-CM

## 2020-12-22 DIAGNOSIS — D696 Thrombocytopenia, unspecified: Secondary | ICD-10-CM

## 2020-12-22 DIAGNOSIS — E538 Deficiency of other specified B group vitamins: Secondary | ICD-10-CM | POA: Diagnosis not present

## 2020-12-22 DIAGNOSIS — Z0001 Encounter for general adult medical examination with abnormal findings: Secondary | ICD-10-CM | POA: Insufficient documentation

## 2020-12-22 DIAGNOSIS — I10 Essential (primary) hypertension: Secondary | ICD-10-CM

## 2020-12-22 DIAGNOSIS — N1831 Chronic kidney disease, stage 3a: Secondary | ICD-10-CM

## 2020-12-22 DIAGNOSIS — E559 Vitamin D deficiency, unspecified: Secondary | ICD-10-CM | POA: Diagnosis not present

## 2020-12-22 LAB — URINALYSIS, ROUTINE W REFLEX MICROSCOPIC
Bilirubin Urine: NEGATIVE
Hgb urine dipstick: NEGATIVE
Ketones, ur: NEGATIVE
Leukocytes,Ua: NEGATIVE
Nitrite: NEGATIVE
RBC / HPF: NONE SEEN (ref 0–?)
Specific Gravity, Urine: >=1.03 — AB (ref 1.000–1.030)
Total Protein, Urine: NEGATIVE
Urine Glucose: NEGATIVE
Urobilinogen, UA: 0.2 (ref 0.0–1.0)
pH: 5.5 (ref 5.0–8.0)

## 2020-12-22 LAB — LIPID PANEL
Cholesterol: 144 mg/dL (ref 0–200)
HDL: 55.3 mg/dL (ref >39.00)
LDL Cholesterol: 77 mg/dL (ref 0–99)
NonHDL: 89.05
Total CHOL/HDL Ratio: 3
Triglycerides: 61 mg/dL (ref 0.0–149.0)
VLDL: 12.2 mg/dL (ref 0.0–40.0)

## 2020-12-22 LAB — CBC WITH DIFFERENTIAL/PLATELET
Basophils Absolute: 0 10*3/uL (ref 0.0–0.1)
Basophils Relative: 1 % (ref 0.0–3.0)
Eosinophils Absolute: 0 10*3/uL (ref 0.0–0.7)
Eosinophils Relative: 0.5 % (ref 0.0–5.0)
HCT: 39.8 % (ref 39.0–52.0)
Hemoglobin: 12.7 g/dL — ABNORMAL LOW (ref 13.0–17.0)
Lymphocytes Relative: 26.2 % (ref 12.0–46.0)
Lymphs Abs: 1 10*3/uL (ref 0.7–4.0)
MCHC: 32 g/dL (ref 30.0–36.0)
MCV: 89.3 fl (ref 78.0–100.0)
Monocytes Absolute: 0.3 10*3/uL (ref 0.1–1.0)
Monocytes Relative: 8.3 % (ref 3.0–12.0)
Neutro Abs: 2.4 10*3/uL (ref 1.4–7.7)
Neutrophils Relative %: 64 % (ref 43.0–77.0)
Platelets: 118 10*3/uL — ABNORMAL LOW (ref 150.0–400.0)
RBC: 4.46 Mil/uL (ref 4.22–5.81)
RDW: 14.3 % (ref 11.5–15.5)
WBC: 3.8 10*3/uL — ABNORMAL LOW (ref 4.0–10.5)

## 2020-12-22 LAB — BASIC METABOLIC PANEL
BUN: 14 mg/dL (ref 6–23)
CO2: 27 mEq/L (ref 19–32)
Calcium: 9.6 mg/dL (ref 8.4–10.5)
Chloride: 105 mEq/L (ref 96–112)
Creatinine, Ser: 1.25 mg/dL (ref 0.40–1.50)
GFR: 56.12 mL/min — ABNORMAL LOW (ref >60.00)
Glucose, Bld: 81 mg/dL (ref 70–99)
Potassium: 4 mEq/L (ref 3.5–5.1)
Sodium: 141 mEq/L (ref 135–145)

## 2020-12-22 LAB — HEPATIC FUNCTION PANEL
ALT: 16 U/L (ref 0–53)
AST: 22 U/L (ref 0–37)
Albumin: 4.1 g/dL (ref 3.5–5.2)
Alkaline Phosphatase: 71 U/L (ref 39–117)
Bilirubin, Direct: 0.1 mg/dL (ref 0.0–0.3)
Total Bilirubin: 0.6 mg/dL (ref 0.2–1.2)
Total Protein: 7 g/dL (ref 6.0–8.3)

## 2020-12-22 LAB — VITAMIN D 25 HYDROXY (VIT D DEFICIENCY, FRACTURES): VITD: 49.91 ng/mL (ref 30.00–100.00)

## 2020-12-22 LAB — TSH: TSH: 1.26 u[IU]/mL (ref 0.35–5.50)

## 2020-12-22 LAB — HEMOGLOBIN A1C: Hgb A1c MFr Bld: 5.6 % (ref 4.6–6.5)

## 2020-12-22 LAB — VITAMIN B12: Vitamin B-12: 895 pg/mL (ref 211–911)

## 2020-12-22 NOTE — Patient Instructions (Signed)

## 2020-12-22 NOTE — Progress Notes (Signed)
Patient ID: Jalmer Tooker, male   DOB: 04-23-1945, 75 y.o.   MRN: [id number]         Chief Complaint:: wellness exam and Follow-up (Patient declines flu shot)  Ckd3, TCP, low vit d, htn,        HPI:  Isabel Mcgrue is a 75 y.o. male here for wellness exam; declines Tdap, pneumovax, shingrix, flu shot o/w up to date with preventive referrals and immunizations                        Also taking Vit D.  Pt denies chest pain, increased sob or doe, wheezing, orthopnea, PND, increased LE swelling, palpitations, dizziness or syncope.   Pt denies polydipsia, polyuria, or new focal neuro s/s.  Pt denies fever, wt loss, night sweats, loss of appetite, or other constitutional symptoms  Denies worsening depressive symptoms, suicidal ideation, or panic; No other new complaints     Wt Readings from Last 3 Encounters:  12/22/20 210 lb (95.3 kg)  08/03/20 204 lb (92.5 kg)  07/19/20 209 lb (94.8 kg)   BP Readings from Last 3 Encounters:  12/22/20 122/70  08/03/20 118/78  07/19/20 (!) 142/88   Immunization History  Administered Date(s) Administered   Pneumococcal-Unspecified 12/07/2009   There are no preventive care reminders to display for this patient.     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 Currently on nifedipine 30 mg daily. Serial BPs: -02/03/13: 118/82.     History of COVID-19 11/19/2019   Hyperbilirubinemia    Hypotension 11/18/2019   Hypoxia 11/21/2019   related to COVID-19 infection   Leukocytosis 10/10/2019   Major depressive disorder 12/30/2013   Personal history of colonic  polyps 12/30/2013   Per colonoscopy at [facility] per pt, about sept 2014   PTSD (post-traumatic stress disorder)    saw combat during [place] war   Thrombocytopenia 10/10/2019   Tubular adenoma of colon 2014   Visual hallucinations 03/2020   Vitamin D deficiency 08/07/2019   Past Surgical History:  Procedure Laterality Date   COLONOSCOPY     MUSCLE RECESSION AND RESECTION Left 12/25/2018   Procedure: INFERIOR OBLIQUE RECESSION LEFT EYE, SUPERIOR OBLIQUE TUCK LEFT EYE;  Surgeon: Gevena Cotton, MD;  Location: [facility];  Service: Ophthalmology;  Laterality: Left;   UPPER GASTROINTESTINAL ENDOSCOPY      reports that he has never smoked. He has never used smokeless tobacco. He reports that he does not drink alcohol and does not use drugs. family history includes Hypertension in an other family member. Allergies  Allergen Reactions   Lisinopril     Angioedema? Patient reports that his tongue and mouth became very swollen, was seen at [facility] for this in 2014.    Current Outpatient Medications on File Prior to Visit  Medication Sig Dispense Refill   acetaminophen (TYLENOL) 325 MG tablet Take 2 tablets (650 mg total) by mouth every 6 (six) hours as needed for mild pain or headache (fever >/= 101).     albuterol (VENTOLIN HFA) 108 (90 Base) MCG/ACT inhaler Inhale 1-2 puffs into the lungs every 4 (  four) hours as needed for wheezing or shortness of breath.     Cholecalciferol (VITAMIN D3) 50 MCG (2000 UT) capsule Take 2,000 Units by mouth daily.     hydrochlorothiazide (HYDRODIURIL) 12.5 MG tablet Take 1 tablet (12.5 mg total) by mouth daily. 90 tablet 3   lactulose (CHRONULAC) 10 GM/15ML solution Take 30 mLs (20 g total) by mouth 2 (two) times daily as needed for moderate constipation or severe constipation. 236 mL 0   losartan (COZAAR) 100 MG tablet Take 1 tablet (100 mg total) by mouth daily. 90 tablet 3   Multiple Vitamins-Minerals (MULTIVITAMIN WITH MINERALS) tablet Take 1 tablet by  mouth daily.     QUEtiapine (SEROQUEL) 50 MG tablet Take 1 tablet (50 mg total) by mouth at bedtime. 90 tablet 1   No current facility-administered medications on file prior to visit.        ROS:  All others reviewed and negative.  Objective        PE:  BP 122/70 (BP Location: Left Arm, Patient Position: Sitting, Cuff Size: Large)   Pulse 60   Temp 98.2 F (36.8 C) (Oral)   Ht '5\' 7"'$  (1.702 m)   Wt 210 lb (95.3 kg)   SpO2 97%   BMI 32.89 kg/m                 Constitutional: Pt appears in NAD               HENT: Head: NCAT.                Right Ear: External ear normal.                 Left Ear: External ear normal.                Eyes: . Pupils are equal, round, and reactive to light. Conjunctivae and EOM are normal               Nose: without d/c or deformity               Neck: Neck supple. Gross normal ROM               Cardiovascular: Normal rate and regular rhythm.                 Pulmonary/Chest: Effort normal and breath sounds without rales or wheezing.                Abd:  Soft, NT, ND, + BS, no organomegaly               Neurological: Pt is alert. At baseline orientation, motor grossly intact               Skin: Skin is warm. No rashes, no other new lesions, LE edema - none               Psychiatric: Pt behavior is normal without agitation   Micro: none  Cardiac tracings I have personally interpreted today:  none  Pertinent Radiological findings (summarize): none   Lab Results  Component Value Date   WBC 3.8 (L) 12/22/2020   HGB 12.7 (L) 12/22/2020   HCT 39.8 12/22/2020   PLT 118.0 (L) 12/22/2020   GLUCOSE 81 12/22/2020   CHOL 144 12/22/2020   TRIG 61.0 12/22/2020   HDL 55.30 12/22/2020   LDLCALC 77 12/22/2020   ALT 16 12/22/2020   AST 22 12/22/2020   NA 141 12/22/2020  K 4.0 12/22/2020   CL 105 12/22/2020   CREATININE 1.25 12/22/2020   BUN 14 12/22/2020   CO2 27 12/22/2020   TSH 1.26 12/22/2020   PSA 0.87 04/01/2020   HGBA1C 5.6 12/22/2020    Assessment/Plan:  Tyler Blech is a 75 y.o. Black or African American [2] male with  has a past medical history of Acute hypoxemic respiratory failure due to COVID-19 (123456), Acute metabolic encephalopathy (A999333), Chronic constipation, CKD (chronic kidney disease), Cognitive deficits following cerebrovascular disease (06/08/2020), Essential hypertension (02/17/2010), History of COVID-19 (11/19/2019), Hyperbilirubinemia, Hypotension (11/18/2019), Hypoxia (11/21/2019), Leukocytosis (10/10/2019), Major depressive disorder (12/30/2013), Personal history of colonic polyps (12/30/2013), PTSD (post-traumatic stress disorder), Thrombocytopenia (10/10/2019), Tubular adenoma of colon (2014), Visual hallucinations (03/2020), and Vitamin D deficiency (08/07/2019).  Vitamin D deficiency Last vitamin D Lab Results  Component Value Date   VD25OH 49.91 12/22/2020   Stable, cont oral replacement   Encounter for well adult exam with abnormal findings Age and sex appropriate education and counseling updated with regular exercise and diet Referrals for preventative services - none needed Immunizations addressed - declines tdap, pneumovax shingrix and tdap Smoking counseling  - none needed Evidence for depression or other mood disorder - none significant Most recent labs reviewed. I have personally reviewed and have noted: 1) the patient's medical and social history 2) The patient's current medications and supplements 3) The patient's height, weight, and BMI have been recorded in the chart   Thrombocytopenia Lab Results  Component Value Date   PLT 118.0 (L) 12/22/2020   Stable, cont current med tx  Essential hypertension BP Readings from Last 3 Encounters:  12/22/20 122/70  08/03/20 118/78  07/19/20 (!) 142/88   Stable, pt to continue medical treatment hct, losartan    CKD (chronic kidney disease), stage III Lab Results  Component Value Date   CREATININE 1.25 12/22/2020    Stable overall, cont to avoid nephrotoxins  Followup: Return in about 6 months (around 06/21/2021).  Cathlean Cower, MD 12/26/2020 12:53 PM [facility]

## 2020-12-26 ENCOUNTER — Encounter: Payer: Self-pay | Admitting: Internal Medicine

## 2020-12-26 NOTE — Assessment & Plan Note (Signed)
Last vitamin D Lab Results  Component Value Date   VD25OH 49.91 12/22/2020   Stable, cont oral replacement

## 2020-12-26 NOTE — Assessment & Plan Note (Signed)
BP Readings from Last 3 Encounters:  12/22/20 122/70  08/03/20 118/78  07/19/20 (!) 142/88   Stable, pt to continue medical treatment hct, losartan

## 2020-12-26 NOTE — Assessment & Plan Note (Signed)
Lab Results  Component Value Date   CREATININE 1.25 12/22/2020   Stable overall, cont to avoid nephrotoxins

## 2020-12-26 NOTE — Assessment & Plan Note (Signed)
Age and sex appropriate education and counseling updated with regular exercise and diet Referrals for preventative services - none needed Immunizations addressed - declines tdap, pneumovax shingrix and tdap Smoking counseling  - none needed Evidence for depression or other mood disorder - none significant Most recent labs reviewed. I have personally reviewed and have noted: 1) the patient's medical and social history 2) The patient's current medications and supplements 3) The patient's height, weight, and BMI have been recorded in the chart

## 2020-12-26 NOTE — Assessment & Plan Note (Signed)
Lab Results  Component Value Date   PLT 118.0 (L) 12/22/2020   Stable, cont current med tx

## 2021-01-16 ENCOUNTER — Encounter (HOSPITAL_COMMUNITY): Payer: Self-pay

## 2021-01-16 ENCOUNTER — Other Ambulatory Visit: Payer: Self-pay

## 2021-01-16 ENCOUNTER — Ambulatory Visit (HOSPITAL_COMMUNITY): Admission: EM | Admit: 2021-01-16 | Discharge: 2021-01-16 | Payer: BC Managed Care – PPO

## 2021-01-16 ENCOUNTER — Emergency Department (HOSPITAL_COMMUNITY)
Admission: EM | Admit: 2021-01-16 | Discharge: 2021-01-17 | Disposition: A | Discharge status: Home / Self Care | Payer: BC Managed Care – PPO | Attending: Emergency Medicine | Admitting: Emergency Medicine

## 2021-01-16 ENCOUNTER — Emergency Department (HOSPITAL_COMMUNITY): Payer: BC Managed Care – PPO

## 2021-01-16 DIAGNOSIS — R531 Weakness: Secondary | ICD-10-CM | POA: Insufficient documentation

## 2021-01-16 DIAGNOSIS — R55 Syncope and collapse: Secondary | ICD-10-CM

## 2021-01-16 DIAGNOSIS — Z8616 Personal history of COVID-19: Secondary | ICD-10-CM | POA: Diagnosis not present

## 2021-01-16 DIAGNOSIS — R42 Dizziness and giddiness: Secondary | ICD-10-CM | POA: Diagnosis not present

## 2021-01-16 DIAGNOSIS — I959 Hypotension, unspecified: Secondary | ICD-10-CM | POA: Diagnosis not present

## 2021-01-16 DIAGNOSIS — N183 Chronic kidney disease, stage 3 unspecified: Secondary | ICD-10-CM | POA: Diagnosis not present

## 2021-01-16 DIAGNOSIS — I129 Hypertensive chronic kidney disease with stage 1 through stage 4 chronic kidney disease, or unspecified chronic kidney disease: Secondary | ICD-10-CM | POA: Insufficient documentation

## 2021-01-16 LAB — CBC
HCT: 39.6 % (ref 39.0–52.0)
Hemoglobin: 12.8 g/dL — ABNORMAL LOW (ref 13.0–17.0)
MCH: 28.8 pg (ref 26.0–34.0)
MCHC: 32.3 g/dL (ref 30.0–36.0)
MCV: 89 fL (ref 80.0–100.0)
Platelets: 131 10*3/uL — ABNORMAL LOW (ref 150–400)
RBC: 4.45 MIL/uL (ref 4.22–5.81)
RDW: 13.2 % (ref 11.5–15.5)
WBC: 5.5 10*3/uL (ref 4.0–10.5)
nRBC: 0 % (ref 0.0–0.2)

## 2021-01-16 LAB — BASIC METABOLIC PANEL
Anion gap: 9 (ref 5–15)
BUN: 12 mg/dL (ref 8–23)
CO2: 28 mmol/L (ref 22–32)
Calcium: 9.2 mg/dL (ref 8.9–10.3)
Chloride: 101 mmol/L (ref 98–111)
Creatinine, Ser: 1.26 mg/dL — ABNORMAL HIGH (ref 0.61–1.24)
GFR, Estimated: 59 mL/min — ABNORMAL LOW (ref >60)
Glucose, Bld: 94 mg/dL (ref 70–99)
Potassium: 3.8 mmol/L (ref 3.5–5.1)
Sodium: 138 mmol/L (ref 135–145)

## 2021-01-16 LAB — D-DIMER, QUANTITATIVE: D-Dimer, Quant: 0.59 ug/mL-FEU — ABNORMAL HIGH (ref 0.00–0.50)

## 2021-01-16 MED ORDER — LACTATED RINGERS IV BOLUS
1000.0000 mL | Freq: Once | INTRAVENOUS | Status: AC
  Administered 2021-01-16: 1000 mL via INTRAVENOUS

## 2021-01-16 NOTE — ED Provider Notes (Signed)
Patient care assumed at 2330.  Pt here following near syncopal event when he was at dinner, occurred just after urinating.  He was hypotensive at urgent care prior to ED arrival.    On ED assessment he is asymptomatic and blood pressures have returned to normal to slightly hypertensive.. Labs are reassuring. BMP is similar when compared to priors. Troponin is negative. UA is not consistent with UTI. CBC with mild anemia, thrombocytopenia, which is stable when compared to prior. His D dimer is mildly elevated but is negative based on age adjustment. Current presentation is not consistent with PE. Has had abdominal imaging within the last year, doubt AAA. Discussed with patient and wife near syncope, possibly related to a vagal episode with recent urination. This also may be secondary to his intermittent blood pressure medication use. Plan to discharge home with PCP follow-up and return precautions.   Quintella Reichert, MD 01/17/21 [phone]

## 2021-01-16 NOTE — ED Provider Notes (Signed)
[facility] EMERGENCY DEPARTMENT Provider Note   CSN: [id number] Arrival date & time: 01/16/21  1636     History Chief Complaint  Patient presents with   Near Syncope   Weakness   Dizziness    [name] is a 75 y.o. male.  Patient is a 75 year old male with a history of hypertension, CKD, prior episodes of hallucinations and intermittent hypotension who is presenting today after having a near syncopal event today while at a restaurant.  He got up to go to the bathroom and reported he had urinated and he had no straining or difficulty urinating but when he had finished was getting ready to leave the bathroom he started feeling lightheaded, generally weak and could not pull him up self up from a seated position.  Somebody in the bathroom asked if he needed help and helped him back out to the table.  He reported he was having difficulty walking out to the table because he was still feeling dizzy.  His wife was present reported that he was never diaphoretic, pale or complaining of chest pain, shortness of breath.  He had no speech difficulty, unilateral weakness or numbness and no facial drooping.  She reported he was always very alert and knew what was going on.  He has been compliant with his medications.  Prior to this he had been feeling normal.  He denies any recent illness, abdominal pain, nausea or vomiting.  He does suffer with chronic constipation but that has been unchanged.  The history is provided by the patient and the spouse.  Near Syncope This is a recurrent problem.  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 Currently on nifedipine 30 mg daily. Serial BPs: -02/03/13: 118/82.     History of COVID-19 11/19/2019   Hyperbilirubinemia    Hypotension 11/18/2019   Hypoxia 11/21/2019   related to COVID-19 infection   Leukocytosis 10/10/2019   Major depressive disorder 12/30/2013   Personal history of colonic polyps 12/30/2013   Per colonoscopy at [facility] per pt, about sept 2014   PTSD (post-traumatic stress disorder)    saw combat during [place] war   Thrombocytopenia 10/10/2019   Tubular adenoma of colon 2014   Visual hallucinations 03/2020   Vitamin D deficiency 08/07/2019    Patient Active Problem List   Diagnosis Date Noted   Encounter for well adult exam with abnormal findings 12/22/2020   Insomnia 08/03/2020   Cervical spinal stenosis 07/19/2020   Hallucinations, visual 07/19/2020   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   History of colonic polyp    Hyperbilirubinemia    Hypoxia    Shortness of breath    Pressure injury of skin 11/27/2019   CKD (chronic kidney disease), stage III 41/28/7867   Acute metabolic encephalopathy 67/20/9470   History of COVID-19 11/21/2019   Acute hypoxemic respiratory failure due to COVID-19 11/18/2019   Hypotension 11/18/2019   Leukocytosis 10/10/2019   Thrombocytopenia 10/10/2019   Vitamin D deficiency 08/07/2019   Chronic constipation 09/16/2018  Erectile dysfunction 07/11/2017   Major depressive disorder 12/30/2013   Personal history of colonic polyps 12/30/2013   PTSD (post-traumatic stress disorder) 02/03/2013   Essential hypertension 02/17/2010    Past Surgical History:  Procedure Laterality Date   COLONOSCOPY     MUSCLE RECESSION AND RESECTION Left 12/25/2018   Procedure: INFERIOR OBLIQUE RECESSION LEFT EYE, SUPERIOR OBLIQUE TUCK LEFT EYE;  Surgeon:  Gevena Cotton, MD;  Location: [facility];  Service: Ophthalmology;  Laterality: Left;   UPPER GASTROINTESTINAL ENDOSCOPY         Family History  Problem Relation Age of Onset   Hypertension Other    Colon cancer Neg Hx    Esophageal cancer Neg Hx    Stomach cancer Neg Hx    Rectal cancer Neg Hx     Social History   Tobacco Use   Smoking status: Never   Smokeless tobacco: Never  Vaping Use   Vaping Use: Never used  Substance Use Topics   Alcohol use: No    Alcohol/week: 0.0 standard drinks   Drug use: No    Home Medications Prior to Admission medications   Medication Sig Start Date End Date Taking? Authorizing Provider  acetaminophen (TYLENOL) 325 MG tablet Take 2 tablets (650 mg total) by mouth every 6 (six) hours as needed for mild pain or headache (fever >/= 101). 12/09/19   Guilford Shi, MD  albuterol (VENTOLIN HFA) 108 (90 Base) MCG/ACT inhaler Inhale 1-2 puffs into the lungs every 4 (four) hours as needed for wheezing or shortness of breath. 12/09/19   Guilford Shi, MD  Cholecalciferol (VITAMIN D3) 50 MCG (2000 UT) capsule Take 2,000 Units by mouth daily.    [provider]  hydrochlorothiazide (HYDRODIURIL) 12.5 MG tablet Take 1 tablet (12.5 mg total) by mouth daily. 08/03/20   Biagio Borg, MD  lactulose (CHRONULAC) 10 GM/15ML solution Take 30 mLs (20 g total) by mouth 2 (two) times daily as needed for moderate constipation or severe constipation. 12/09/19   Guilford Shi, MD  losartan (COZAAR) 100 MG tablet Take 1 tablet (100 mg total) by mouth daily. 07/19/20   Biagio Borg, MD  Multiple Vitamins-Minerals (MULTIVITAMIN WITH MINERALS) tablet Take 1 tablet by mouth daily.    [provider]  QUEtiapine (SEROQUEL) 50 MG tablet Take 1 tablet (50 mg total) by mouth at bedtime. 07/19/20   Biagio Borg, MD    Allergies    Lisinopril  Review of Systems   Review of Systems  Cardiovascular:  Positive for near-syncope.   Neurological:  Positive for dizziness and weakness.  All other systems reviewed and are negative.  Physical Exam Updated Vital Signs BP 132/87   Pulse (!) 59   Temp 98.6 F (37 C)   Resp 19   SpO2 99%   Physical Exam Vitals and nursing note reviewed.  Constitutional:      General: He is not in acute distress.    Appearance: Normal appearance. He is well-developed and normal weight.  HENT:     Head: Normocephalic and atraumatic.     Mouth/Throat:     Mouth: Mucous membranes are moist.  Eyes:     Conjunctiva/sclera: Conjunctivae normal.     Pupils: Pupils are equal, round, and reactive to light.  Cardiovascular:     Rate and Rhythm: Normal rate and regular rhythm.     Pulses: Normal pulses.     Heart sounds: No murmur heard. Pulmonary:     Effort: Pulmonary effort is  normal. No respiratory distress.     Breath sounds: Normal breath sounds. No wheezing or rales.  Abdominal:     General: There is no distension.     Palpations: Abdomen is soft.     Tenderness: There is no abdominal tenderness. There is no right CVA tenderness, left CVA tenderness, guarding or rebound.  Musculoskeletal:        General: No tenderness. Normal range of motion.     Cervical back: Normal range of motion and neck supple.     Right lower leg: No edema.     Left lower leg: No edema.  Skin:    General: Skin is warm and dry.     Findings: No erythema or rash.  Neurological:     Mental Status: He is alert and oriented to person, place, and time. Mental status is at baseline.     Sensory: No sensory deficit.     Motor: No weakness.  Psychiatric:        Mood and Affect: Mood normal.        Behavior: Behavior normal.        Thought Content: Thought content normal.    ED Results / Procedures / Treatments   Labs (all labs ordered are listed, but only abnormal results are displayed) Labs Reviewed  BASIC METABOLIC PANEL - Abnormal; Notable for the following components:      Result Value    Creatinine, Ser 1.26 (*)    GFR, Estimated 59 (*)    All other components within normal limits  CBC - Abnormal; Notable for the following components:   Hemoglobin 12.8 (*)    Platelets 131 (*)    All other components within normal limits  URINALYSIS, ROUTINE W REFLEX MICROSCOPIC  D-DIMER, QUANTITATIVE  CBG MONITORING, ED  TROPONIN I (HIGH SENSITIVITY)    EKG EKG Interpretation  Date/Time:  Sunday January 16 2021 18:27:39 EDT Ventricular Rate:  61 PR Interval:  170 QRS Duration: 88 QT Interval:  430 QTC Calculation: 432 R Axis:   43 Text Interpretation: Normal sinus rhythm Normal ECG No significant change since last tracing Confirmed by Blanchie Dessert [phone]) on 01/16/2021 10:17:17 PM  Radiology No results found.  Procedures Procedures   Medications Ordered in ED Medications  lactated ringers bolus 1,000 mL (has no administration in time range)    ED Course  I have reviewed the triage vital signs and the nursing notes.  Pertinent labs & imaging results that were available during my care of the patient were reviewed by me and considered in my medical decision making (see chart for details).    MDM Rules/Calculators/A&P                           Elderly male presenting today after a near syncopal event associated with hypotension at a restaurant.  Symptoms lasted for 30 to 45 minutes where he reported feeling dizzy but denies any other symptoms.  He currently is asymptomatic.  No prior history of PE or DVT.  1 year ago he did have a CTA that was negative for PE and he and his wife both agree that he is still fairly active and working.  He has had no recent medication change and has been compliant with his meds.  EKG today without acute findings.  Labs to ensure no evidence of anemia or electrolyte abnormality pending.  Patient was noted to be hypotensive initially at urgent care and upon arrival to  the ED but normal on my evaluation in the room.  No unilateral leg pain or  swelling.  Will ensure no concern for PE.  Troponin, CBC, BMP, D-dimer and UA pending. Final Clinical Impression(s) / ED Diagnoses Final diagnoses:  None    Rx / DC Orders ED Discharge Orders     None        Blanchie Dessert, MD 01/16/21 2337

## 2021-01-16 NOTE — ED Provider Notes (Signed)
[place]    CSN: [id number] Arrival date & time: 01/16/21  1510      History   Chief Complaint Chief Complaint  Patient presents with   Fatigue   Weakness    HPI [name] is a 75 y.o. male.   Patient here for evaluation of weakness and fatigue that started today.  Also reports having a syncopal episode after eating lunch.  Patient reports that he "blanked out" and then needed to be helped up.  BP 85/57 and oxygen 92%.  The history is provided by the patient and a caregiver.  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 Currently on nifedipine 30 mg daily. Serial BPs: -02/03/13: 118/82.     History of COVID-19 11/19/2019   Hyperbilirubinemia    Hypotension 11/18/2019   Hypoxia 11/21/2019   related to COVID-19 infection   Leukocytosis 10/10/2019   Major depressive disorder 12/30/2013   Personal history of colonic polyps 12/30/2013   Per colonoscopy at [facility] per pt, about sept 2014   PTSD (post-traumatic stress disorder)    saw combat during [place] war   Thrombocytopenia 10/10/2019   Tubular adenoma of colon 2014   Visual hallucinations 03/2020   Vitamin D deficiency 08/07/2019    Patient Active Problem List   Diagnosis Date Noted   Encounter for well adult exam with abnormal findings 12/22/2020   Insomnia 08/03/2020   Cervical spinal stenosis 07/19/2020   Hallucinations, visual 07/19/2020   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   History of colonic polyp    Hyperbilirubinemia    Hypoxia    Shortness of breath     Pressure injury of skin 11/27/2019   CKD (chronic kidney disease), stage III 96/78/9381   Acute metabolic encephalopathy 01/75/1025   History of COVID-19 11/21/2019   Acute hypoxemic respiratory failure due to COVID-19 11/18/2019   Hypotension 11/18/2019   Leukocytosis 10/10/2019   Thrombocytopenia 10/10/2019   Vitamin D deficiency 08/07/2019   Chronic constipation 09/16/2018   Erectile dysfunction 07/11/2017   Major depressive disorder 12/30/2013   Personal history of colonic polyps 12/30/2013   PTSD (post-traumatic stress disorder) 02/03/2013   Essential hypertension 02/17/2010    Past Surgical History:  Procedure Laterality Date   COLONOSCOPY     MUSCLE RECESSION AND RESECTION Left 12/25/2018   Procedure: INFERIOR OBLIQUE RECESSION LEFT EYE, SUPERIOR OBLIQUE TUCK LEFT EYE;  Surgeon: Gevena Cotton, MD;  Location: [facility];  Service: Ophthalmology;  Laterality: Left;   UPPER GASTROINTESTINAL ENDOSCOPY         Home Medications    Prior to Admission medications   Medication Sig Start Date End Date Taking? Authorizing Provider  acetaminophen (TYLENOL) 325 MG tablet Take 2 tablets (650 mg total) by mouth every 6 (six) hours as needed for mild pain or headache (fever >/= 101). 12/09/19   Guilford Shi, MD  albuterol (VENTOLIN HFA) 108 (90 Base) MCG/ACT inhaler Inhale 1-2 puffs into the lungs every 4 (four) hours as needed for wheezing or shortness of breath. 12/09/19   Guilford Shi, MD  Cholecalciferol (VITAMIN D3) 50 MCG (2000 UT) capsule  Take 2,000 Units by mouth daily.    [provider]  hydrochlorothiazide (HYDRODIURIL) 12.5 MG tablet Take 1 tablet (12.5 mg total) by mouth daily. 08/03/20   Biagio Borg, MD  lactulose (CHRONULAC) 10 GM/15ML solution Take 30 mLs (20 g total) by mouth 2 (two) times daily as needed for moderate constipation or severe constipation. 12/09/19   Guilford Shi, MD  losartan (COZAAR) 100 MG tablet Take 1 tablet  (100 mg total) by mouth daily. 07/19/20   Biagio Borg, MD  Multiple Vitamins-Minerals (MULTIVITAMIN WITH MINERALS) tablet Take 1 tablet by mouth daily.    [provider]  QUEtiapine (SEROQUEL) 50 MG tablet Take 1 tablet (50 mg total) by mouth at bedtime. 07/19/20   Biagio Borg, MD    Family History Family History  Problem Relation Age of Onset   Hypertension Other    Colon cancer Neg Hx    Esophageal cancer Neg Hx    Stomach cancer Neg Hx    Rectal cancer Neg Hx     Social History Social History   Tobacco Use   Smoking status: Never   Smokeless tobacco: Never  Vaping Use   Vaping Use: Never used  Substance Use Topics   Alcohol use: No    Alcohol/week: 0.0 standard drinks   Drug use: No     Allergies   Lisinopril   Review of Systems Review of Systems  Neurological:  Positive for syncope and weakness.  All other systems reviewed and are negative.   Physical Exam Triage Vital Signs ED Triage Vitals  Enc Vitals Group     BP 01/16/21 1610 (!) 85/57     Pulse Rate 01/16/21 1610 76     Resp 01/16/21 1610 16     Temp 01/16/21 1610 98.6 F (37 C)     Temp Source 01/16/21 1610 Oral     SpO2 01/16/21 1610 92 %     Weight --      Height --      Head Circumference --      Peak Flow --      Pain Score 01/16/21 1609 0     Pain Loc --      Pain Edu? --      Excl. in [place]? --    No data found.  Updated Vital Signs BP (!) 85/57 (BP Location: Left Arm)   Pulse 76   Temp 98.6 F (37 C) (Oral)   Resp 16   SpO2 92%   Visual Acuity Right Eye Distance:   Left Eye Distance:   Bilateral Distance:    Right Eye Near:   Left Eye Near:    Bilateral Near:     Physical Exam Vitals and nursing note reviewed.  HENT:     Head: Normocephalic and atraumatic.  Eyes:     Conjunctiva/sclera: Conjunctivae normal.  Cardiovascular:     Rate and Rhythm: Normal rate.     Pulses: Normal pulses.  Pulmonary:     Effort: Pulmonary effort is normal.  Abdominal:      General: Abdomen is flat.  Musculoskeletal:        General: Normal range of motion.     Cervical back: Normal range of motion.  Skin:    General: Skin is warm and dry.  Neurological:     General: No focal deficit present.     Mental Status: He is oriented to person, place, and time.  Psychiatric:  Mood and Affect: Mood normal.     UC Treatments / Results  Labs (all labs ordered are listed, but only abnormal results are displayed) Labs Reviewed - No data to display  EKG   Radiology No results found.  Procedures Procedures (including critical care time)  Medications Ordered in UC Medications - No data to display  Initial Impression / Assessment and Plan / UC Course  I have reviewed the triage vital signs and the nursing notes.  Pertinent labs & imaging results that were available during my care of the patient were reviewed by me and considered in my medical decision making (see chart for details).    Patient initially checked in with complaint of diarrhea and some weakness but upon evaluation discovered that he had syncopal episode earlier today and was hypotensive in office.  It was recommended the patient go to the emergency room for a more thorough evaluation at a higher level of care.  Patient and caregiver declined EMS transport even though it was recommended.  Caregiver will transport patient to ED via private vehicle. Final Clinical Impressions(s) / UC Diagnoses   Final diagnoses:  Syncope, unspecified syncope type  Hypotension, unspecified hypotension type   Discharge Instructions   None    ED Prescriptions   None    PDMP not reviewed this encounter.   Pearson Forster, NP 01/16/21 [phone]

## 2021-01-16 NOTE — ED Provider Notes (Signed)
Emergency Medicine Provider Triage Evaluation Note  [name] , a 75 y.o. male  was evaluated in triage.  Pt complains of near syncopal episode today around lunchtime.  Patient reports he was standing up in the bathroom and went to turn around to leave and became dizzy enough to sit down.  The patient denies any falls or head trauma, or loss of consciousness.  He denies any chest pain or shortness of breath.  He denies any unilateral weakness, trouble with walking or speech.  The patient was able to walk from the restaurant to his car with ease.  Patient reports he is no longer feeling dizzy.  Patient was seen in urgent care prior to arrival where they noted soft blood pressures and low O2 sat and was advised to present to the ED.  Review of Systems  Positive: Dizziness Negative: Chest pain, shortness of breath, nausea, vomiting, diaphoresis  Physical Exam  BP 120/81   Pulse 65   Temp 98.5 F (36.9 C)   Resp 16   SpO2 98%  Gen:   Awake, no distress   Resp:  Normal effort  MSK:   Moves extremities without difficulty.  Strong equal strength in bilateral upper and lower extremities.  Sensation intact. Other:    Medical Decision Making  Medically screening exam initiated at 6:24 PM.  Appropriate orders placed.  Calvin Jablonowski was informed that the remainder of the evaluation will be completed by another provider, this initial triage assessment does not replace that evaluation, and the importance of remaining in the ED until their evaluation is complete.  Vital signs stable.  No hypotension.  O2 sat 98% . Weakness set ordered.   Sherrell Puller, PA-C 01/16/21 Dupont, Annabella, MD 01/17/21 [phone]

## 2021-01-16 NOTE — ED Triage Notes (Signed)
Pt c/o new-onset fatigue, weakness. After lunch, went to restroom & "felt woozy." Said that he lowered himself to sitting but had to be helped up. Seen at UC today, BP soft (83'T systolic) & SpO2 low (07%) so advised to come to ED for further workup.

## 2021-01-16 NOTE — ED Notes (Signed)
Pt and spouse refused EMS transport

## 2021-01-16 NOTE — ED Triage Notes (Signed)
Pt presents with new onset of intermittent weakness, fatigue, and slight confusion today.

## 2021-01-17 LAB — TROPONIN I (HIGH SENSITIVITY): Troponin I (High Sensitivity): 14 ng/L (ref <18)

## 2021-01-17 LAB — URINALYSIS, ROUTINE W REFLEX MICROSCOPIC
Bilirubin Urine: NEGATIVE
Glucose, UA: NEGATIVE mg/dL
Hgb urine dipstick: NEGATIVE
Ketones, ur: NEGATIVE mg/dL
Leukocytes,Ua: NEGATIVE
Nitrite: NEGATIVE
Protein, ur: NEGATIVE mg/dL
Specific Gravity, Urine: 1.003 — ABNORMAL LOW (ref 1.005–1.030)
pH: 7 (ref 5.0–8.0)

## 2021-01-17 NOTE — ED Notes (Signed)
E-signature pad unavailable at time of pt discharge. This RN discussed discharge materials with pt and answered all pt questions. Pt stated understanding of discharge material. ? ?

## 2021-01-19 ENCOUNTER — Ambulatory Visit: Payer: BC Managed Care – PPO | Admitting: Internal Medicine

## 2021-01-19 ENCOUNTER — Other Ambulatory Visit: Payer: Self-pay

## 2021-01-19 ENCOUNTER — Encounter: Payer: Self-pay | Admitting: Internal Medicine

## 2021-01-19 VITALS — BP 160/86 | HR 60 | Temp 98.6°F | Ht 67.0 in | Wt 195.0 lb

## 2021-01-19 DIAGNOSIS — D696 Thrombocytopenia, unspecified: Secondary | ICD-10-CM

## 2021-01-19 DIAGNOSIS — R0989 Other specified symptoms and signs involving the circulatory and respiratory systems: Secondary | ICD-10-CM | POA: Diagnosis not present

## 2021-01-19 DIAGNOSIS — N1831 Chronic kidney disease, stage 3a: Secondary | ICD-10-CM

## 2021-01-19 DIAGNOSIS — E559 Vitamin D deficiency, unspecified: Secondary | ICD-10-CM

## 2021-01-19 DIAGNOSIS — I10 Essential (primary) hypertension: Secondary | ICD-10-CM

## 2021-01-19 MED ORDER — HYDRALAZINE HCL 25 MG PO TABS
25.0000 mg | ORAL_TABLET | Freq: Three times a day (TID) | ORAL | 11 refills | Status: DC
Start: 2021-01-19 — End: 2021-07-31

## 2021-01-19 NOTE — Progress Notes (Signed)
Patient ID: [name], male   DOB: 05-07-1945, 74 y.o.   MRN: [id number]        Chief Complaint: follow up HTN labile, vit d def, TCP, and ckd       HPI:  [name] is a 75 y.o. male here with wife overall doing ok.  Pt denies chest pain, increased sob or doe, wheezing, orthopnea, PND, increased LE swelling, palpitations, but SBP at home has been as low as less than 100, and also higher than 160s for now seeming rhyme or reason. Did have episode syncope recently though vasovagal despite sitting with urination.   Wife concerned about each.  No falls or injury.  No unusual bruising or bleeding.   Pt denies polydipsia, polyuria, or new focal neuro s/s.   Pt denies fever, wt loss, night sweats, loss of appetite, or other constitutional symptoms   no other new complaints      Wt Readings from Last 3 Encounters:  01/19/21 195 lb (88.5 kg)  12/22/20 210 lb (95.3 kg)  08/03/20 204 lb (92.5 kg)   BP Readings from Last 3 Encounters:  01/19/21 (!) 160/86  01/17/21 (!) 188/107  01/16/21 (!) 85/57         Past Medical History:  Diagnosis Date   Acute hypoxemic respiratory failure due to COVID-19 27/78/2423   Acute metabolic encephalopathy 53/61/4431   related to COVID-19 infection   Chronic constipation    CKD (chronic kidney disease)    stage III   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 Currently on nifedipine 30 mg daily. Serial BPs: -02/03/13: 118/82.     History of COVID-19 11/19/2019   Hyperbilirubinemia    Hypotension 11/18/2019   Hypoxia 11/21/2019   related to COVID-19 infection   Leukocytosis 10/10/2019   Major depressive disorder 12/30/2013   Personal history of colonic polyps 12/30/2013   Per colonoscopy at [facility] per pt, about sept 2014   PTSD (post-traumatic stress disorder)    saw combat during [place] war    Thrombocytopenia 10/10/2019   Tubular adenoma of colon 2014   Visual hallucinations 03/2020   Vitamin D deficiency 08/07/2019   Past Surgical History:  Procedure Laterality Date   COLONOSCOPY     MUSCLE RECESSION AND RESECTION Left 12/25/2018   Procedure: INFERIOR OBLIQUE RECESSION LEFT EYE, SUPERIOR OBLIQUE TUCK LEFT EYE;  Surgeon: Gevena Cotton, MD;  Location: [facility];  Service: Ophthalmology;  Laterality: Left;   UPPER GASTROINTESTINAL ENDOSCOPY      reports that he has never smoked. He has never used smokeless tobacco. He reports that he does not drink alcohol and does not use drugs. family history includes Hypertension in an other family member. Allergies  Allergen Reactions   Lisinopril     Angioedema? Patient reports that his tongue and mouth became very swollen, was seen at [facility] for this in 2014.    Current Outpatient Medications on File Prior to Visit  Medication Sig Dispense Refill   acetaminophen (TYLENOL) 325 MG tablet Take 2 tablets (650 mg total) by mouth every 6 (six) hours as needed for mild pain or headache (fever >/= 101).     albuterol (VENTOLIN HFA) 108 (90 Base) MCG/ACT inhaler Inhale 1-2 puffs into the lungs every 4 (four) hours as needed for wheezing or shortness of breath.     Artificial Tear Ointment (DRY EYES OP) Place 1 drop  into both eyes daily as needed (dry eyes).     Cholecalciferol (VITAMIN D3) 50 MCG (2000 UT) capsule Take 2,000 Units by mouth daily.     lactulose (CHRONULAC) 10 GM/15ML solution Take 30 mLs (20 g total) by mouth 2 (two) times daily as needed for moderate constipation or severe constipation. 236 mL 0   losartan (COZAAR) 100 MG tablet Take 1 tablet (100 mg total) by mouth daily. 90 tablet 3   Multiple Vitamins-Minerals (ONE-A-DAY MENS 50+) TABS Take 1 tablet by mouth daily.     QUEtiapine (SEROQUEL) 50 MG tablet Take 1 tablet (50 mg total) by mouth at bedtime. 90 tablet 1   vitamin B-12 (CYANOCOBALAMIN) 1000 MCG tablet  Take 1,000 mcg by mouth daily.     vitamin C (ASCORBIC ACID) 500 MG tablet Take 500 mg by mouth daily.     No current facility-administered medications on file prior to visit.        ROS:  All others reviewed and negative.  Objective        PE:  BP (!) 160/86 (BP Location: Left Arm, Patient Position: Sitting, Cuff Size: Large)   Pulse 60   Temp 98.6 F (37 C) (Oral)   Ht 5\' 7"  (1.702 m)   Wt 195 lb (88.5 kg)   SpO2 96%   BMI 30.54 kg/m                 Constitutional: Pt appears in NAD               HENT: Head: NCAT.                Right Ear: External ear normal.                 Left Ear: External ear normal.                Eyes: . Pupils are equal, round, and reactive to light. Conjunctivae and EOM are normal               Nose: without d/c or deformity               Neck: Neck supple. Gross normal ROM               Cardiovascular: Normal rate and regular rhythm.                 Pulmonary/Chest: Effort normal and breath sounds without rales or wheezing.                Abd:  Soft, NT, ND, + BS, no organomegaly               Neurological: Pt is alert. At baseline orientation, motor grossly intact               Skin: Skin is warm. No rashes, no other new lesions, LE edema - none               Psychiatric: Pt behavior is normal without agitation   Micro: none  Cardiac tracings I have personally interpreted today:  none  Pertinent Radiological findings (summarize): none   Lab Results  Component Value Date   WBC 5.5 01/16/2021   HGB 12.8 (L) 01/16/2021   HCT 39.6 01/16/2021   PLT 131 (L) 01/16/2021   GLUCOSE 94 01/16/2021   CHOL 144 12/22/2020   TRIG 61.0 12/22/2020   HDL 55.30 12/22/2020   LDLCALC 77 12/22/2020   ALT  16 12/22/2020   AST 22 12/22/2020   NA 138 01/16/2021   K 3.8 01/16/2021   CL 101 01/16/2021   CREATININE 1.26 (H) 01/16/2021   BUN 12 01/16/2021   CO2 28 01/16/2021   TSH 1.26 12/22/2020   PSA 0.87 04/01/2020   HGBA1C 5.6 12/22/2020    Assessment/Plan:  [name] is a 75 y.o. Black or African American [2] male with  has a past medical history of Acute hypoxemic respiratory failure due to COVID-19 (76/28/3151), Acute metabolic encephalopathy (76/16/0737), Chronic constipation, CKD (chronic kidney disease), Cognitive deficits following cerebrovascular disease (06/08/2020), Essential hypertension (02/17/2010), History of COVID-19 (11/19/2019), Hyperbilirubinemia, Hypotension (11/18/2019), Hypoxia (11/21/2019), Leukocytosis (10/10/2019), Major depressive disorder (12/30/2013), Personal history of colonic polyps (12/30/2013), PTSD (post-traumatic stress disorder), Thrombocytopenia (10/10/2019), Tubular adenoma of colon (2014), Visual hallucinations (03/2020), and Vitamin D deficiency (08/07/2019).  Vitamin D deficiency Last vitamin D Lab Results  Component Value Date   VD25OH 49.91 12/22/2020   Stable, cont oral replacement   Thrombocytopenia Lab Results  Component Value Date   WBC 5.5 01/16/2021   HGB 12.8 (L) 01/16/2021   HCT 39.6 01/16/2021   MCV 89.0 01/16/2021   PLT 131 (L) 01/16/2021   Mild, c/w pt and wife, no symptoms and can continue to f/u with next labs  CKD (chronic kidney disease), stage III Lab Results  Component Value Date   CREATININE 1.26 (H) 01/16/2021   Stable overall, cont to avoid nephrotoxins   Labile hypertension Mild labile with borderline low and definite highs - for d/c hct, and add hydralazine 25 mg tid prn SBP > 150, but continue the losartan 100  Followup: No follow-ups on file.  Cathlean Cower, MD 01/23/2021 4:16 PM [facility]

## 2021-01-19 NOTE — Patient Instructions (Addendum)
Ok to stop the HCT (fluid pill) for now  Please take all new medication as prescribed - the hydralazine 25 mg up to three times per day for SBP (the top blood pressure number) that is more than 150  Please continue all other medications as before, including the losartan  Please have the pharmacy call with any other refills you may need.  Please continue your efforts at being more active, low cholesterol diet, and weight control.  Please keep your appointments with your specialists as you may have planned  Please return on Jun 22, 2021 as planned, or sooner if needed

## 2021-01-23 NOTE — Assessment & Plan Note (Signed)
Lab Results  Component Value Date   WBC 5.5 01/16/2021   HGB 12.8 (L) 01/16/2021   HCT 39.6 01/16/2021   MCV 89.0 01/16/2021   PLT 131 (L) 01/16/2021   Mild, c/w pt and wife, no symptoms and can continue to f/u with next labs

## 2021-01-23 NOTE — Assessment & Plan Note (Addendum)
Mild labile with borderline low and definite highs - for d/c hct, and add hydralazine 25 mg tid prn SBP > 150, but continue the losartan 100

## 2021-01-23 NOTE — Assessment & Plan Note (Signed)
Last vitamin D Lab Results  Component Value Date   VD25OH 49.91 12/22/2020   Stable, cont oral replacement

## 2021-01-23 NOTE — Assessment & Plan Note (Signed)
Lab Results  Component Value Date   CREATININE 1.26 (H) 01/16/2021   Stable overall, cont to avoid nephrotoxins

## 2021-02-09 ENCOUNTER — Telehealth: Payer: Self-pay | Admitting: Internal Medicine

## 2021-02-09 NOTE — Telephone Encounter (Signed)
Type of form received- Disability Parking Placard    Form placed in- Provider mailbox   Additional instructions from the patient- Notify by phone when complete [phone] [facility])    Reminded patient that forms take 7-10 business days

## 2021-02-11 NOTE — Telephone Encounter (Signed)
Patient notified and will pick up paperwork

## 2021-02-11 NOTE — Telephone Encounter (Signed)
Called to check on the status of paperwork. I relayed the turn around time with them but reassured them that the forms have been placed in the providers box.

## 2021-02-28 ENCOUNTER — Telehealth: Payer: Self-pay | Admitting: Internal Medicine

## 2021-02-28 NOTE — Telephone Encounter (Signed)
Patient requesting appt, patient states bp was 200/113  Advised patient I was transferring the call to a triage nurse  Call was then disconnected, call patient back and LVM to call the office back

## 2021-02-28 NOTE — Telephone Encounter (Signed)
Team health advised the patient to be seen in 4 hours by his PCP, no appointments. Scheduled with Dr. Mitchel Honour for 11.15.22 at 2:40pm

## 2021-03-01 ENCOUNTER — Encounter: Payer: Self-pay | Admitting: Emergency Medicine

## 2021-03-01 ENCOUNTER — Ambulatory Visit: Payer: BC Managed Care – PPO | Admitting: Emergency Medicine

## 2021-03-01 ENCOUNTER — Other Ambulatory Visit: Payer: Self-pay

## 2021-03-01 VITALS — BP 140/80 | HR 73 | Wt 197.0 lb

## 2021-03-01 DIAGNOSIS — R0989 Other specified symptoms and signs involving the circulatory and respiratory systems: Secondary | ICD-10-CM | POA: Diagnosis not present

## 2021-03-01 DIAGNOSIS — N1831 Chronic kidney disease, stage 3a: Secondary | ICD-10-CM | POA: Diagnosis not present

## 2021-03-01 NOTE — Progress Notes (Signed)
[name] 75 y.o.   Chief Complaint  Patient presents with   Hypertension    HISTORY OF PRESENT ILLNESS: This is a 75 y.o. male here today for follow-up of hypertension. Seen by Dr. Jenny Reichmann on 01/20/2019 and started on hydralazine 25 mg 3 times a day.  Advised to continue losartan 100 mg daily. Patient also has history of CKD stage IIIa. Wife states blood pressure has been up and down the past couple weeks.  Asymptomatic. Gets right-sided lumbar/hip pain occasionally.  HPI   Prior to Admission medications   Medication Sig Start Date End Date Taking? Authorizing Provider  acetaminophen (TYLENOL) 325 MG tablet Take 2 tablets (650 mg total) by mouth every 6 (six) hours as needed for mild pain or headache (fever >/= 101). 12/09/19  Yes Guilford Shi, MD  albuterol (VENTOLIN HFA) 108 (90 Base) MCG/ACT inhaler Inhale 1-2 puffs into the lungs every 4 (four) hours as needed for wheezing or shortness of breath. 12/09/19  Yes Guilford Shi, MD  Artificial Tear Ointment (DRY EYES OP) Place 1 drop into both eyes daily as needed (dry eyes).   Yes [provider]  Cholecalciferol (VITAMIN D3) 50 MCG (2000 UT) capsule Take 2,000 Units by mouth daily.   Yes [provider]  hydrALAZINE (APRESOLINE) 25 MG tablet Take 1 tablet (25 mg total) by mouth 3 (three) times daily. 01/19/21  Yes Biagio Borg, MD  lactulose (CHRONULAC) 10 GM/15ML solution Take 30 mLs (20 g total) by mouth 2 (two) times daily as needed for moderate constipation or severe constipation. 12/09/19  Yes Guilford Shi, MD  losartan (COZAAR) 100 MG tablet Take 1 tablet (100 mg total) by mouth daily. 07/19/20  Yes Biagio Borg, MD  Multiple Vitamins-Minerals (ONE-A-DAY MENS 50+) TABS Take 1 tablet by mouth daily.   Yes [provider]  QUEtiapine (SEROQUEL) 50 MG tablet Take 1 tablet (50 mg total) by mouth at bedtime. 07/19/20  Yes Biagio Borg, MD  vitamin B-12 (CYANOCOBALAMIN) 1000 MCG tablet Take  1,000 mcg by mouth daily.   Yes [provider]  vitamin C (ASCORBIC ACID) 500 MG tablet Take 500 mg by mouth daily.   Yes [provider]    Allergies  Allergen Reactions   Lisinopril     Angioedema? Patient reports that his tongue and mouth became very swollen, was seen at [facility] for this in 2014.     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   History of colonic polyp    Hyperbilirubinemia    CKD (chronic kidney disease), stage III 34/19/6222   Acute metabolic encephalopathy 97/98/9211   History of COVID-19 11/21/2019   Thrombocytopenia 10/10/2019   Vitamin D deficiency 08/07/2019   Chronic constipation 09/16/2018   Erectile dysfunction 07/11/2017   Major depressive disorder 12/30/2013   Personal history of colonic polyps 12/30/2013   PTSD (post-traumatic stress disorder) 02/03/2013   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 Currently on nifedipine 30 mg  daily. Serial BPs: -02/03/13: 118/82.     History of COVID-19 11/19/2019   Hyperbilirubinemia    Hypotension 11/18/2019   Hypoxia 11/21/2019   related to COVID-19 infection   Leukocytosis 10/10/2019   Major depressive disorder 12/30/2013   Personal history of colonic polyps 12/30/2013   Per colonoscopy at [facility] per pt, about sept 2014   PTSD (post-traumatic stress disorder)    saw combat during [place] war   Thrombocytopenia 10/10/2019    Tubular adenoma of colon 2014   Visual hallucinations 03/2020   Vitamin D deficiency 08/07/2019    Past Surgical History:  Procedure Laterality Date   COLONOSCOPY     MUSCLE RECESSION AND RESECTION Left 12/25/2018   Procedure: INFERIOR OBLIQUE RECESSION LEFT EYE, SUPERIOR OBLIQUE TUCK LEFT EYE;  Surgeon: Gevena Cotton, MD;  Location: [facility];  Service: Ophthalmology;  Laterality: Left;   UPPER GASTROINTESTINAL ENDOSCOPY      Social History   Socioeconomic History   Marital status: Single    Spouse name: Not on file   Number of children: Not on file   Years of education: 15   Highest education level: Some college, no degree  Occupational History   Occupation: Extended leave  Tobacco Use   Smoking status: Never   Smokeless tobacco: Never  Vaping Use   Vaping Use: Never used  Substance and Sexual Activity   Alcohol use: No    Alcohol/week: 0.0 standard drinks   Drug use: No   Sexual activity: Yes  Other Topics Concern   Not on file  Social History Narrative   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[place] [employer]   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  Negative for chills and fever.  HENT: Negative.  Negative for congestion and sore throat.   Respiratory: Negative.  Negative for cough and shortness of breath.   Cardiovascular: Negative.  Negative for chest pain and palpitations.   Gastrointestinal:  Negative for abdominal pain, diarrhea, nausea and vomiting.  Genitourinary: Negative.   Skin: Negative.  Negative for rash.  Neurological: Negative.  Negative for dizziness and headaches.  All other systems reviewed and are negative.  Today's Vitals   03/01/21 1432  BP: (!) 152/82  Pulse: 73  SpO2: 93%  Weight: 197 lb (89.4 kg)   Body mass index is 30.85 kg/m.  Physical Exam Vitals reviewed.  Constitutional:      Appearance: Normal appearance.  HENT:     Head: Normocephalic.  Eyes:     Extraocular Movements: Extraocular movements intact.     Pupils: Pupils are equal, round, and reactive to light.  Cardiovascular:     Rate and Rhythm: Normal rate and regular rhythm.     Pulses: Normal pulses.     Heart sounds: Normal  heart sounds.  Pulmonary:     Effort: Pulmonary effort is normal.     Breath sounds: Normal breath sounds.  Musculoskeletal:     Cervical back: No tenderness.  Skin:    General: Skin is warm and dry.     Capillary Refill: Capillary refill takes less than 2 seconds.  Neurological:     General: No focal deficit present.     Mental Status: He is alert and oriented to person, place, and time.  Psychiatric:        Mood and Affect: Mood normal.     ASSESSMENT & PLAN: Problem List Items Addressed This Visit       Cardiovascular and Mediastinum   Labile hypertension - Primary    Well-controlled.  Continue losartan 100 mg and hydralazine 25 mg 3 times a day. Continue monitoring blood pressure readings at home several times a day for the next couple of weeks and keep a log. Follow-up with PCP as scheduled.        Genitourinary   CKD (chronic kidney disease), stage III   Patient Instructions  Continue losartan 100 mg daily on hydralazine 25 mg 3 times a day. Avoid NSAIDs such as Aleve, Advil, ibuprofen, and Motrin.  Take Tylenol as needed for pain. Hypertension, Adult High blood pressure (hypertension) is when the force of blood  pumping through the arteries is too strong. The arteries are the blood vessels that carry blood from the heart throughout the body. Hypertension forces the heart to work harder to pump blood and may cause arteries to become narrow or stiff. Untreated or uncontrolled hypertension can cause a heart attack, heart failure, a stroke, kidney disease, and other problems. A blood pressure reading consists of a higher number over a lower number. Ideally, your blood pressure should be below 120/80. The first ("top") number is called the systolic pressure. It is a measure of the pressure in your arteries as your heart beats. The second ("bottom") number is called the diastolic pressure. It is a measure of the pressure in your arteries as the heart relaxes. What are the causes? The exact cause of this condition is not known. There are some conditions that result in or are related to high blood pressure. What increases the risk? Some risk factors for high blood pressure are under your control. The following factors may make you more likely to develop this condition: Smoking. Having type 2 diabetes mellitus, high cholesterol, or both. Not getting enough exercise or physical activity. Being overweight. Having too much fat, sugar, calories, or salt (sodium) in your diet. Drinking too much alcohol. Some risk factors for high blood pressure may be difficult or impossible to change. Some of these factors include: Having chronic kidney disease. Having a family history of high blood pressure. Age. Risk increases with age. Race. You may be at higher risk if you are African American. Gender. Men are at higher risk than women before age 63. After age 36, women are at higher risk than men. Having obstructive sleep apnea. Stress. What are the signs or symptoms? High blood pressure may not cause symptoms. Very high blood pressure (hypertensive crisis) may cause: Headache. Anxiety. Shortness of  breath. Nosebleed. Nausea and vomiting. Vision changes. Severe chest pain. Seizures. How is this diagnosed? This condition is diagnosed by measuring your blood pressure while you are seated, with your arm resting on a flat surface, your legs uncrossed, and your feet flat on the floor. The cuff of the blood pressure monitor will  be placed directly against the skin of your upper arm at the level of your heart. It should be measured at least twice using the same arm. Certain conditions can cause a difference in blood pressure between your right and left arms. Certain factors can cause blood pressure readings to be lower or higher than normal for a short period of time: When your blood pressure is higher when you are in a health care provider's office than when you are at home, this is called white coat hypertension. Most people with this condition do not need medicines. When your blood pressure is higher at home than when you are in a health care provider's office, this is called masked hypertension. Most people with this condition may need medicines to control blood pressure. If you have a high blood pressure reading during one visit or you have normal blood pressure with other risk factors, you may be asked to: Return on a different day to have your blood pressure checked again. Monitor your blood pressure at home for 1 week or longer. If you are diagnosed with hypertension, you may have other blood or imaging tests to help your health care provider understand your overall risk for other conditions. How is this treated? This condition is treated by making healthy lifestyle changes, such as eating healthy foods, exercising more, and reducing your alcohol intake. Your health care provider may prescribe medicine if lifestyle changes are not enough to get your blood pressure under control, and if: Your systolic blood pressure is above 130. Your diastolic blood pressure is above 80. Your personal target  blood pressure may vary depending on your medical conditions, your age, and other factors. Follow these instructions at home: Eating and drinking  Eat a diet that is high in fiber and potassium, and low in sodium, added sugar, and fat. An example eating plan is called the DASH (Dietary Approaches to Stop Hypertension) diet. To eat this way: Eat plenty of fresh fruits and vegetables. Try to fill one half of your plate at each meal with fruits and vegetables. Eat whole grains, such as whole-wheat pasta, brown rice, or whole-grain bread. Fill about one fourth of your plate with whole grains. Eat or drink low-fat dairy products, such as skim milk or low-fat yogurt. Avoid fatty cuts of meat, processed or cured meats, and poultry with skin. Fill about one fourth of your plate with lean proteins, such as fish, chicken without skin, beans, eggs, or tofu. Avoid pre-made and processed foods. These tend to be higher in sodium, added sugar, and fat. Reduce your daily sodium intake. Most people with hypertension should eat less than 1,500 mg of sodium a day. Do not drink alcohol if: Your health care provider tells you not to drink. You are pregnant, may be pregnant, or are planning to become pregnant. If you drink alcohol: Limit how much you use to: 0-1 drink a day for women. 0-2 drinks a day for men. Be aware of how much alcohol is in your drink. In the [place] one drink equals one 12 oz bottle of beer (355 mL), one 5 oz glass of wine (148 mL), or one 1 oz glass of hard liquor (44 mL). Lifestyle  Work with your health care provider to maintain a healthy body weight or to lose weight. Ask what an ideal weight is for you. Get at least 30 minutes of exercise most days of the week. Activities may include walking, swimming, or biking. Include exercise to strengthen your muscles (resistance  exercise), such as Pilates or lifting weights, as part of your weekly exercise routine. Try to do these types of exercises  for 30 minutes at least 3 days a week. Do not use any products that contain nicotine or tobacco, such as cigarettes, e-cigarettes, and chewing tobacco. If you need help quitting, ask your health care provider. Monitor your blood pressure at home as told by your health care provider. Keep all follow-up visits as told by your health care provider. This is important. Medicines Take over-the-counter and prescription medicines only as told by your health care provider. Follow directions carefully. Blood pressure medicines must be taken as prescribed. Do not skip doses of blood pressure medicine. Doing this puts you at risk for problems and can make the medicine less effective. Ask your health care provider about side effects or reactions to medicines that you should watch for. Contact a health care provider if you: Think you are having a reaction to a medicine you are taking. Have headaches that keep coming back (recurring). Feel dizzy. Have swelling in your ankles. Have trouble with your vision. Get help right away if you: Develop a severe headache or confusion. Have unusual weakness or numbness. Feel faint. Have severe pain in your chest or abdomen. Vomit repeatedly. Have trouble breathing. Summary Hypertension is when the force of blood pumping through your arteries is too strong. If this condition is not controlled, it may put you at risk for serious complications. Your personal target blood pressure may vary depending on your medical conditions, your age, and other factors. For most people, a normal blood pressure is less than 120/80. Hypertension is treated with lifestyle changes, medicines, or a combination of both. Lifestyle changes include losing weight, eating a healthy, low-sodium diet, exercising more, and limiting alcohol. This information is not intended to replace advice given to you by your health care provider. Make sure you discuss any questions you have with your health care  provider. Document Revised: 12/12/2017 Document Reviewed: 12/12/2017 Elsevier Patient Education  2022 [place], [place] [facility]

## 2021-03-01 NOTE — Assessment & Plan Note (Signed)
Well-controlled.  Continue losartan 100 mg and hydralazine 25 mg 3 times a day. Continue monitoring blood pressure readings at home several times a day for the next couple of weeks and keep a log. Follow-up with PCP as scheduled.

## 2021-03-01 NOTE — Patient Instructions (Signed)
Continue losartan 100 mg daily on hydralazine 25 mg 3 times a day. Avoid NSAIDs such as Aleve, Advil, ibuprofen, and Motrin.  Take Tylenol as needed for pain. Hypertension, Adult High blood pressure (hypertension) is when the force of blood pumping through the arteries is too strong. The arteries are the blood vessels that carry blood from the heart throughout the body. Hypertension forces the heart to work harder to pump blood and may cause arteries to become narrow or stiff. Untreated or uncontrolled hypertension can cause a heart attack, heart failure, a stroke, kidney disease, and other problems. A blood pressure reading consists of a higher number over a lower number. Ideally, your blood pressure should be below 120/80. The first ("top") number is called the systolic pressure. It is a measure of the pressure in your arteries as your heart beats. The second ("bottom") number is called the diastolic pressure. It is a measure of the pressure in your arteries as the heart relaxes. What are the causes? The exact cause of this condition is not known. There are some conditions that result in or are related to high blood pressure. What increases the risk? Some risk factors for high blood pressure are under your control. The following factors may make you more likely to develop this condition: Smoking. Having type 2 diabetes mellitus, high cholesterol, or both. Not getting enough exercise or physical activity. Being overweight. Having too much fat, sugar, calories, or salt (sodium) in your diet. Drinking too much alcohol. Some risk factors for high blood pressure may be difficult or impossible to change. Some of these factors include: Having chronic kidney disease. Having a family history of high blood pressure. Age. Risk increases with age. Race. You may be at higher risk if you are African American. Gender. Men are at higher risk than women before age 71. After age 25, women are at higher risk than  men. Having obstructive sleep apnea. Stress. What are the signs or symptoms? High blood pressure may not cause symptoms. Very high blood pressure (hypertensive crisis) may cause: Headache. Anxiety. Shortness of breath. Nosebleed. Nausea and vomiting. Vision changes. Severe chest pain. Seizures. How is this diagnosed? This condition is diagnosed by measuring your blood pressure while you are seated, with your arm resting on a flat surface, your legs uncrossed, and your feet flat on the floor. The cuff of the blood pressure monitor will be placed directly against the skin of your upper arm at the level of your heart. It should be measured at least twice using the same arm. Certain conditions can cause a difference in blood pressure between your right and left arms. Certain factors can cause blood pressure readings to be lower or higher than normal for a short period of time: When your blood pressure is higher when you are in a health care provider's office than when you are at home, this is called white coat hypertension. Most people with this condition do not need medicines. When your blood pressure is higher at home than when you are in a health care provider's office, this is called masked hypertension. Most people with this condition may need medicines to control blood pressure. If you have a high blood pressure reading during one visit or you have normal blood pressure with other risk factors, you may be asked to: Return on a different day to have your blood pressure checked again. Monitor your blood pressure at home for 1 week or longer. If you are diagnosed with hypertension, you may  have other blood or imaging tests to help your health care provider understand your overall risk for other conditions. How is this treated? This condition is treated by making healthy lifestyle changes, such as eating healthy foods, exercising more, and reducing your alcohol intake. Your health care provider  may prescribe medicine if lifestyle changes are not enough to get your blood pressure under control, and if: Your systolic blood pressure is above 130. Your diastolic blood pressure is above 80. Your personal target blood pressure may vary depending on your medical conditions, your age, and other factors. Follow these instructions at home: Eating and drinking  Eat a diet that is high in fiber and potassium, and low in sodium, added sugar, and fat. An example eating plan is called the DASH (Dietary Approaches to Stop Hypertension) diet. To eat this way: Eat plenty of fresh fruits and vegetables. Try to fill one half of your plate at each meal with fruits and vegetables. Eat whole grains, such as whole-wheat pasta, brown rice, or whole-grain bread. Fill about one fourth of your plate with whole grains. Eat or drink low-fat dairy products, such as skim milk or low-fat yogurt. Avoid fatty cuts of meat, processed or cured meats, and poultry with skin. Fill about one fourth of your plate with lean proteins, such as fish, chicken without skin, beans, eggs, or tofu. Avoid pre-made and processed foods. These tend to be higher in sodium, added sugar, and fat. Reduce your daily sodium intake. Most people with hypertension should eat less than 1,500 mg of sodium a day. Do not drink alcohol if: Your health care provider tells you not to drink. You are pregnant, may be pregnant, or are planning to become pregnant. If you drink alcohol: Limit how much you use to: 0-1 drink a day for women. 0-2 drinks a day for men. Be aware of how much alcohol is in your drink. In the [place] one drink equals one 12 oz bottle of beer (355 mL), one 5 oz glass of wine (148 mL), or one 1 oz glass of hard liquor (44 mL). Lifestyle  Work with your health care provider to maintain a healthy body weight or to lose weight. Ask what an ideal weight is for you. Get at least 30 minutes of exercise most days of the week. Activities may  include walking, swimming, or biking. Include exercise to strengthen your muscles (resistance exercise), such as Pilates or lifting weights, as part of your weekly exercise routine. Try to do these types of exercises for 30 minutes at least 3 days a week. Do not use any products that contain nicotine or tobacco, such as cigarettes, e-cigarettes, and chewing tobacco. If you need help quitting, ask your health care provider. Monitor your blood pressure at home as told by your health care provider. Keep all follow-up visits as told by your health care provider. This is important. Medicines Take over-the-counter and prescription medicines only as told by your health care provider. Follow directions carefully. Blood pressure medicines must be taken as prescribed. Do not skip doses of blood pressure medicine. Doing this puts you at risk for problems and can make the medicine less effective. Ask your health care provider about side effects or reactions to medicines that you should watch for. Contact a health care provider if you: Think you are having a reaction to a medicine you are taking. Have headaches that keep coming back (recurring). Feel dizzy. Have swelling in your ankles. Have trouble with your vision. Get help  right away if you: Develop a severe headache or confusion. Have unusual weakness or numbness. Feel faint. Have severe pain in your chest or abdomen. Vomit repeatedly. Have trouble breathing. Summary Hypertension is when the force of blood pumping through your arteries is too strong. If this condition is not controlled, it may put you at risk for serious complications. Your personal target blood pressure may vary depending on your medical conditions, your age, and other factors. For most people, a normal blood pressure is less than 120/80. Hypertension is treated with lifestyle changes, medicines, or a combination of both. Lifestyle changes include losing weight, eating a healthy,  low-sodium diet, exercising more, and limiting alcohol. This information is not intended to replace advice given to you by your health care provider. Make sure you discuss any questions you have with your health care provider. Document Revised: 12/12/2017 Document Reviewed: 12/12/2017 Elsevier Patient Education  [place].

## 2021-03-02 ENCOUNTER — Ambulatory Visit: Payer: BC Managed Care – PPO | Admitting: Family Medicine

## 2021-05-27 IMAGING — US US ABDOMEN LIMITED
1 series · 14 of 25 positions shown · non-contrast
Comparison: None.

CLINICAL DATA: Hyperbilirubinemia.

EXAM:
ULTRASOUND ABDOMEN LIMITED RIGHT UPPER QUADRANT

[Series 1: us abdomen limited ruq · 14 of 39 slices shown]
[im 1/39]
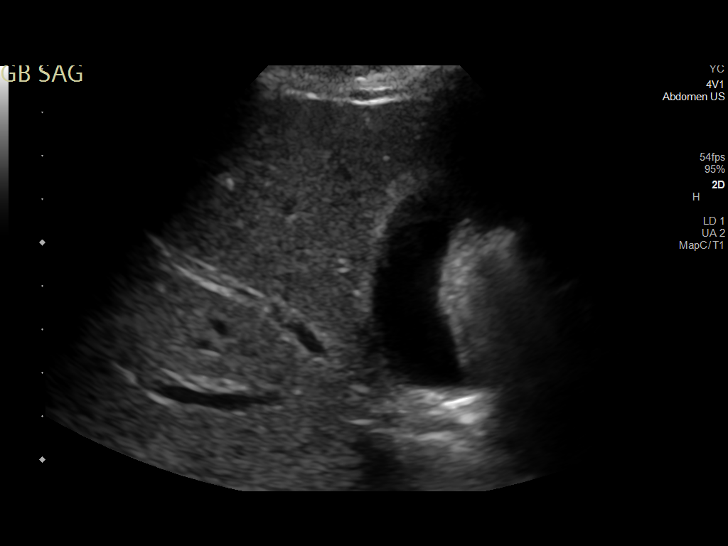
[im 4/39]
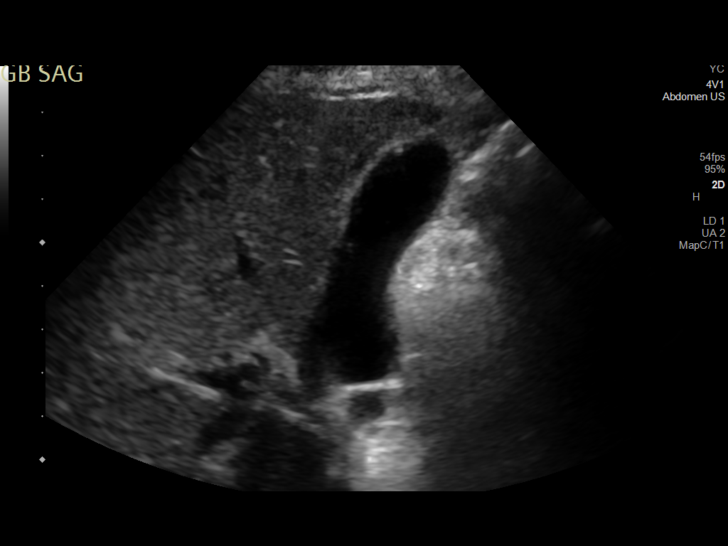
[im 7/39]
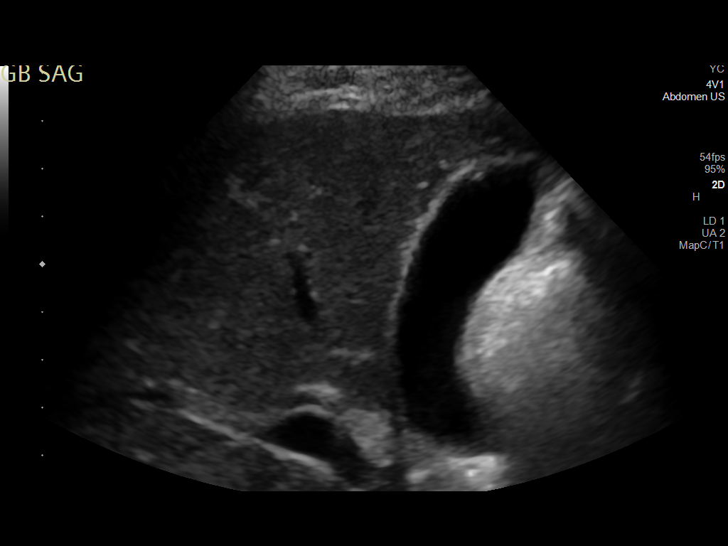
[im 10/39]
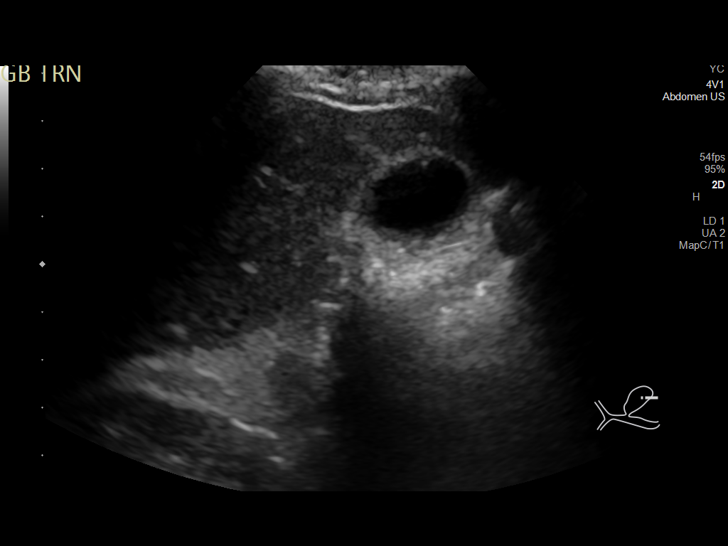
[im 13/39]
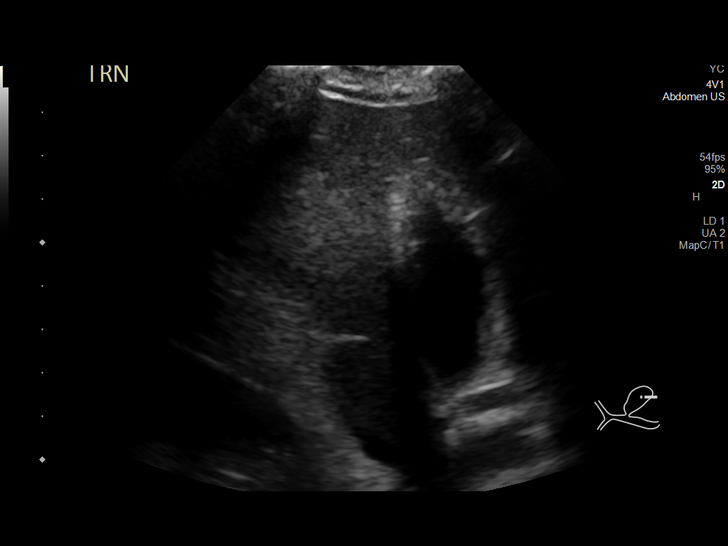
[im 15/39]
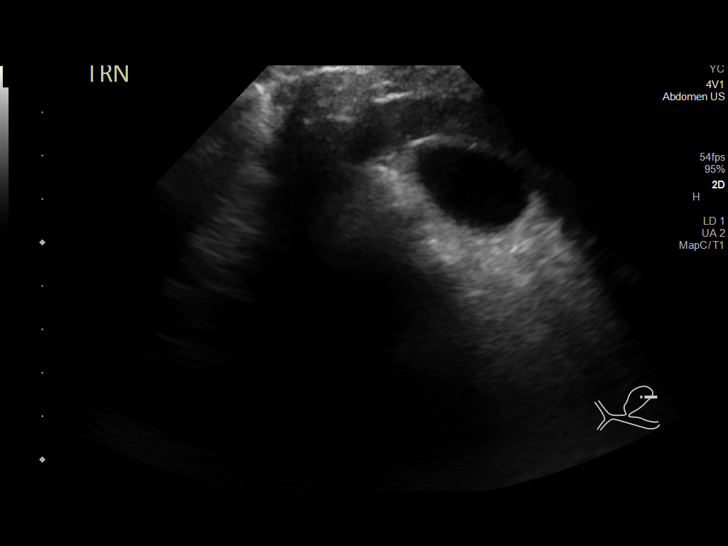
[im 18/39]
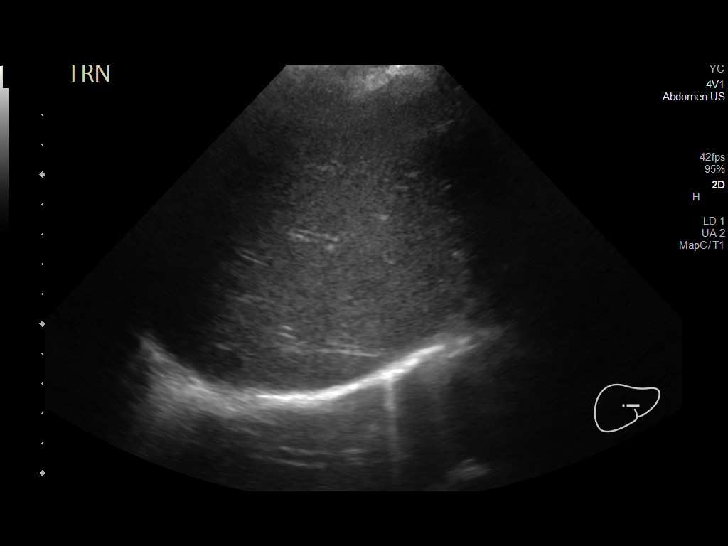
[im 21/39]
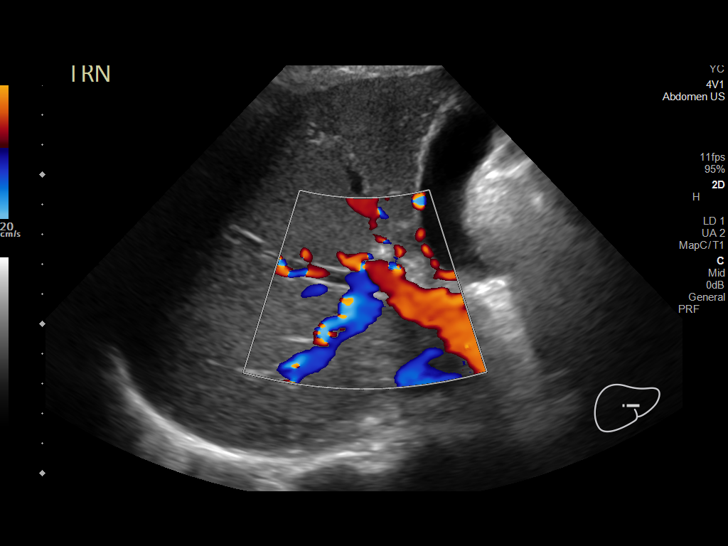
[im 24/39]
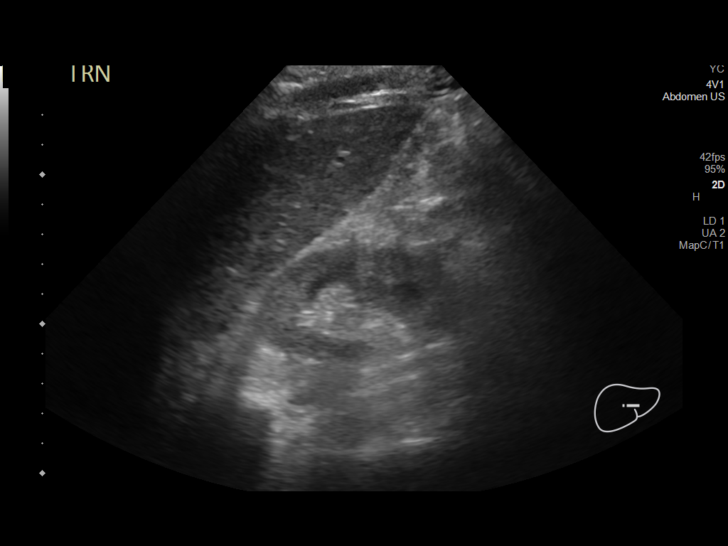
[im 26/39]
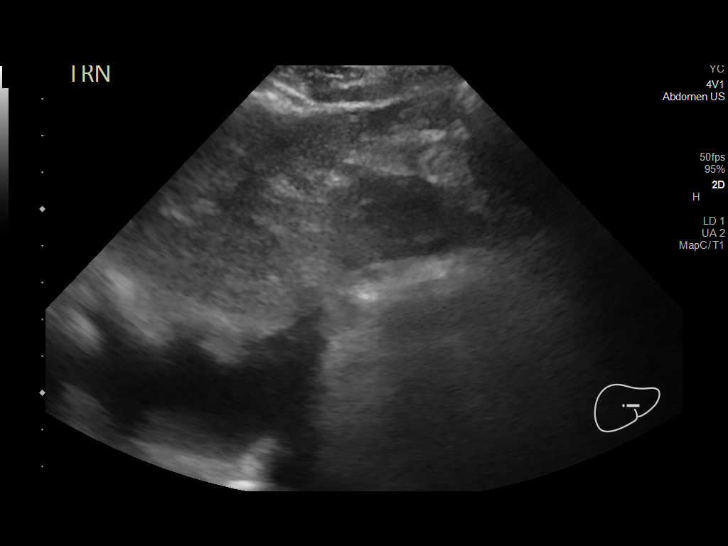
[im 29/39]
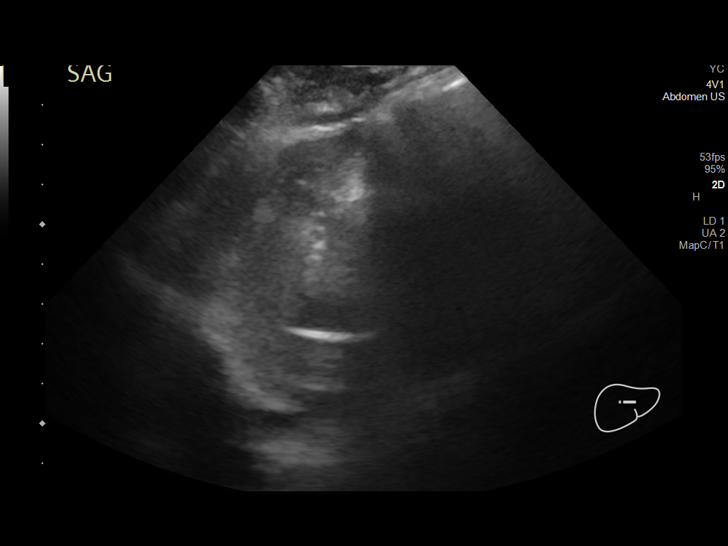
[im 32/39]
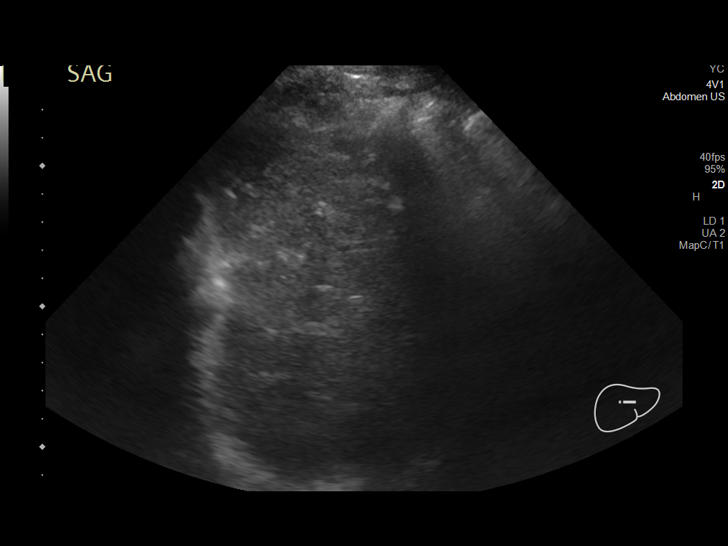
[im 35/39]
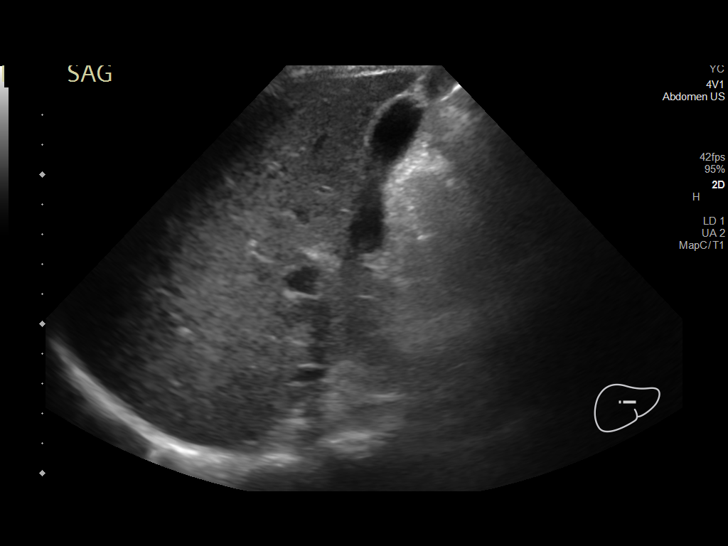
[im 39/39]
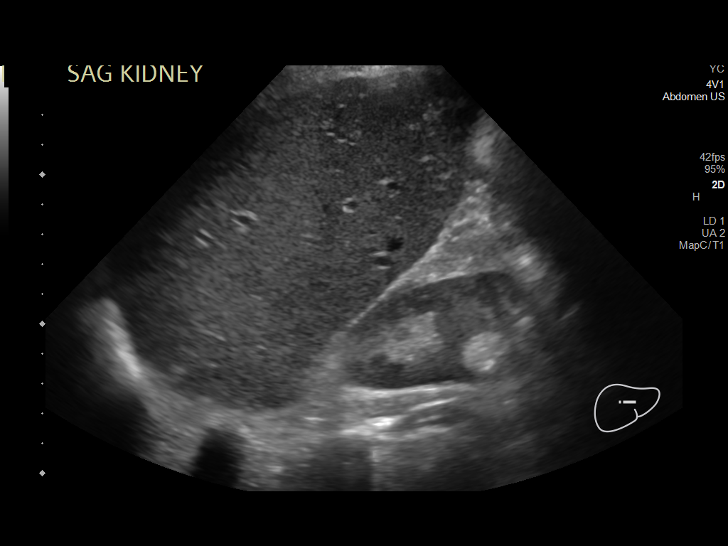

[14 of 25 positions shown; findings below may reference images not displayed]

FINDINGS: Gallbladder:

A 2.0 mm echogenic polyp is seen within the nondependent portion of
the gallbladder wall. No gallstones or wall thickening visualized
(1.9 mm). No sonographic Murphy sign noted by sonographer.

Common bile duct:

Diameter: 5.1 mm

Liver:

No focal lesion identified. Within normal limits in parenchymal
echogenicity. Portal vein is patent on color Doppler imaging with
normal direction of blood flow towards the liver.

Other: None.
IMPRESSION: 2.0 mm gallbladder polyp. Correlation with follow-up right upper
quadrant ultrasound is recommended to confirm stability.

## 2021-06-22 ENCOUNTER — Encounter: Payer: Self-pay | Admitting: Internal Medicine

## 2021-06-22 ENCOUNTER — Other Ambulatory Visit: Payer: Self-pay

## 2021-06-22 ENCOUNTER — Ambulatory Visit: Payer: BC Managed Care – PPO | Admitting: Internal Medicine

## 2021-06-22 VITALS — BP 120/70 | HR 58 | Ht 67.0 in | Wt 195.0 lb

## 2021-06-22 DIAGNOSIS — E538 Deficiency of other specified B group vitamins: Secondary | ICD-10-CM | POA: Diagnosis not present

## 2021-06-22 DIAGNOSIS — I10 Essential (primary) hypertension: Secondary | ICD-10-CM | POA: Diagnosis not present

## 2021-06-22 DIAGNOSIS — N1831 Chronic kidney disease, stage 3a: Secondary | ICD-10-CM

## 2021-06-22 DIAGNOSIS — E559 Vitamin D deficiency, unspecified: Secondary | ICD-10-CM | POA: Diagnosis not present

## 2021-06-22 DIAGNOSIS — R739 Hyperglycemia, unspecified: Secondary | ICD-10-CM | POA: Diagnosis not present

## 2021-06-22 DIAGNOSIS — M545 Low back pain, unspecified: Secondary | ICD-10-CM | POA: Diagnosis not present

## 2021-06-22 DIAGNOSIS — Z0001 Encounter for general adult medical examination with abnormal findings: Secondary | ICD-10-CM | POA: Diagnosis not present

## 2021-06-22 DIAGNOSIS — R0989 Other specified symptoms and signs involving the circulatory and respiratory systems: Secondary | ICD-10-CM

## 2021-06-22 LAB — BASIC METABOLIC PANEL
BUN: 19 mg/dL (ref 6–23)
CO2: 26 mEq/L (ref 19–32)
Calcium: 9.8 mg/dL (ref 8.4–10.5)
Chloride: 104 mEq/L (ref 96–112)
Creatinine, Ser: 1.39 mg/dL (ref 0.40–1.50)
GFR: 49.23 mL/min — ABNORMAL LOW (ref >60.00)
Glucose, Bld: 85 mg/dL (ref 70–99)
Potassium: 4.9 mEq/L (ref 3.5–5.1)
Sodium: 139 mEq/L (ref 135–145)

## 2021-06-22 LAB — CBC WITH DIFFERENTIAL/PLATELET
Basophils Absolute: 0 10*3/uL (ref 0.0–0.1)
Basophils Relative: 0.7 % (ref 0.0–3.0)
Eosinophils Absolute: 0 10*3/uL (ref 0.0–0.7)
Eosinophils Relative: 1.2 % (ref 0.0–5.0)
HCT: 39.7 % (ref 39.0–52.0)
Hemoglobin: 12.8 g/dL — ABNORMAL LOW (ref 13.0–17.0)
Lymphocytes Relative: 33.9 % (ref 12.0–46.0)
Lymphs Abs: 1.3 10*3/uL (ref 0.7–4.0)
MCHC: 32.2 g/dL (ref 30.0–36.0)
MCV: 89.1 fl (ref 78.0–100.0)
Monocytes Absolute: 0.5 10*3/uL (ref 0.1–1.0)
Monocytes Relative: 12.5 % — ABNORMAL HIGH (ref 3.0–12.0)
Neutro Abs: 1.9 10*3/uL (ref 1.4–7.7)
Neutrophils Relative %: 51.7 % (ref 43.0–77.0)
Platelets: 128 10*3/uL — ABNORMAL LOW (ref 150.0–400.0)
RBC: 4.46 Mil/uL (ref 4.22–5.81)
RDW: 14.2 % (ref 11.5–15.5)
WBC: 3.7 10*3/uL — ABNORMAL LOW (ref 4.0–10.5)

## 2021-06-22 LAB — HEPATIC FUNCTION PANEL
ALT: 20 U/L (ref 0–53)
AST: 24 U/L (ref 0–37)
Albumin: 4.4 g/dL (ref 3.5–5.2)
Alkaline Phosphatase: 69 U/L (ref 39–117)
Bilirubin, Direct: 0.1 mg/dL (ref 0.0–0.3)
Total Bilirubin: 0.7 mg/dL (ref 0.2–1.2)
Total Protein: 7.1 g/dL (ref 6.0–8.3)

## 2021-06-22 LAB — URINALYSIS, ROUTINE W REFLEX MICROSCOPIC
Bilirubin Urine: NEGATIVE
Hgb urine dipstick: NEGATIVE
Ketones, ur: NEGATIVE
Leukocytes,Ua: NEGATIVE
Nitrite: NEGATIVE
RBC / HPF: NONE SEEN (ref 0–?)
Specific Gravity, Urine: 1.025 (ref 1.000–1.030)
Total Protein, Urine: NEGATIVE
Urine Glucose: NEGATIVE
Urobilinogen, UA: 0.2 (ref 0.0–1.0)
pH: 5.5 (ref 5.0–8.0)

## 2021-06-22 LAB — TSH: TSH: 1.05 u[IU]/mL (ref 0.35–5.50)

## 2021-06-22 LAB — LIPID PANEL
Cholesterol: 150 mg/dL (ref 0–200)
HDL: 60.8 mg/dL (ref >39.00)
LDL Cholesterol: 72 mg/dL (ref 0–99)
NonHDL: 89.68
Total CHOL/HDL Ratio: 2
Triglycerides: 86 mg/dL (ref 0.0–149.0)
VLDL: 17.2 mg/dL (ref 0.0–40.0)

## 2021-06-22 LAB — VITAMIN D 25 HYDROXY (VIT D DEFICIENCY, FRACTURES): VITD: 59.08 ng/mL (ref 30.00–100.00)

## 2021-06-22 LAB — PSA: PSA: 0.68 ng/mL (ref 0.10–4.00)

## 2021-06-22 LAB — HEMOGLOBIN A1C: Hgb A1c MFr Bld: 5.8 % (ref 4.6–6.5)

## 2021-06-22 LAB — VITAMIN B12: Vitamin B-12: 927 pg/mL — ABNORMAL HIGH (ref 211–911)

## 2021-06-22 MED ORDER — LOSARTAN POTASSIUM 100 MG PO TABS: 100.0000 mg | ORAL_TABLET | Freq: Every day | ORAL | 3 refills | Status: DC

## 2021-06-22 MED ORDER — QUETIAPINE FUMARATE 50 MG PO TABS: 50.0000 mg | ORAL_TABLET | Freq: Every day | ORAL | 1 refills | Status: DC

## 2021-06-22 NOTE — Patient Instructions (Signed)
Please continue all other medications as before, and refills have been done if requested. ? ?Please have the pharmacy call with any other refills you may need. ? ?Please continue your efforts at being more active, low cholesterol diet, and weight control. ? ?You are otherwise up to date with prevention measures today. ? ?Please keep your appointments with your specialists as you may have planned ? ?Plesae see Sports Medicine on the first floor about the pain to the buttocks ? ?Please go to the LAB at the blood drawing area for the tests to be done ? ?You will be contacted by phone if any changes need to be made immediately.  Otherwise, you will receive a letter about your results with an explanation, but please check with MyChart first. ? ?Please remember to sign up for MyChart if you have not done so, as this will be important to you in the future with finding out test results, communicating by private email, and scheduling acute appointments online when needed. ? ?Please make an Appointment to return in 6 months, or sooner if needed ?

## 2021-06-22 NOTE — Progress Notes (Signed)
Patient ID: [name], male   DOB: 30-Apr-1945, 76 y.o.   MRN: [id number]         Chief Complaint:: wellness exam and low back pain, htn, ckd, low vit d       HPI:  [name] is a 76 y.o. male here for wellness exam; declines flu shot, shingrix, pneumovax, tdap o/w up to date                        Also c/o low back pain onset acutely worsening with pain x 1 wk now mild to mod, constant, right lower back with radiation to both buttocks but no LE pain, weakness, numbness.  Pt denies bowel or bladder change, fever, wt loss,  worsening gait change or falls.  Pt denies chest pain, increased sob or doe, wheezing, orthopnea, PND, increased LE swelling, palpitations, dizziness or syncope.  . Pt denies polydipsia, polyuria, or new focal neuro s/s.    Pt denies fever, wt loss, night sweats, loss of appetite, or other constitutional symptoms   Wt Readings from Last 3 Encounters:  06/22/21 195 lb (88.5 kg)  03/01/21 197 lb (89.4 kg)  01/19/21 195 lb (88.5 kg)   BP Readings from Last 3 Encounters:  06/22/21 120/70  03/01/21 140/80  01/19/21 (!) 160/86   Immunization History  Administered Date(s) Administered   Pneumococcal-Unspecified 12/07/2009   There are no preventive care reminders to display for this patient.     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 Currently on nifedipine 30 mg daily. Serial BPs: -02/03/13: 118/82.     History of COVID-19 11/19/2019   Hyperbilirubinemia    Hypotension 11/18/2019   Hypoxia 11/21/2019   related to COVID-19 infection   Leukocytosis  10/10/2019   Major depressive disorder 12/30/2013   Personal history of colonic polyps 12/30/2013   Per colonoscopy at [facility] per pt, about sept 2014   PTSD (post-traumatic stress disorder)    saw combat during [place] war   Thrombocytopenia 10/10/2019   Tubular adenoma of colon 2014   Visual hallucinations 03/2020   Vitamin D deficiency 08/07/2019   Past Surgical History:  Procedure Laterality Date   COLONOSCOPY     MUSCLE RECESSION AND RESECTION Left 12/25/2018   Procedure: INFERIOR OBLIQUE RECESSION LEFT EYE, SUPERIOR OBLIQUE TUCK LEFT EYE;  Surgeon: Gevena Cotton, MD;  Location: [facility];  Service: Ophthalmology;  Laterality: Left;   UPPER GASTROINTESTINAL ENDOSCOPY      reports that he has never smoked. He has never used smokeless tobacco. He reports that he does not drink alcohol and does not use drugs. family history includes Hypertension in an other family member. Allergies  Allergen Reactions   Lisinopril     Angioedema? Patient reports that his tongue and mouth became very swollen, was seen at [facility] for this in 2014.    Current Outpatient Medications on File Prior to Visit  Medication Sig Dispense Refill   acetaminophen (TYLENOL) 325 MG tablet Take 2 tablets (650 mg total) by mouth every 6 (six) hours as needed for mild pain or headache (fever >/=  101).     albuterol (VENTOLIN HFA) 108 (90 Base) MCG/ACT inhaler Inhale 1-2 puffs into the lungs every 4 (four) hours as needed for wheezing or shortness of breath.     Artificial Tear Ointment (DRY EYES OP) Place 1 drop into both eyes daily as needed (dry eyes).     Cholecalciferol (VITAMIN D3) 50 MCG (2000 UT) capsule Take 2,000 Units by mouth daily.     hydrALAZINE (APRESOLINE) 25 MG tablet Take 1 tablet (25 mg total) by mouth 3 (three) times daily. 90 tablet 11   lactulose (CHRONULAC) 10 GM/15ML solution Take 30 mLs (20 g total) by mouth 2 (two) times daily as needed for moderate constipation or severe constipation.  236 mL 0   Multiple Vitamins-Minerals (ONE-A-DAY MENS 50+) TABS Take 1 tablet by mouth daily.     vitamin B-12 (CYANOCOBALAMIN) 1000 MCG tablet Take 1,000 mcg by mouth daily.     vitamin C (ASCORBIC ACID) 500 MG tablet Take 500 mg by mouth daily.     No current facility-administered medications on file prior to visit.        ROS:  All others reviewed and negative.  Objective        PE:  BP 120/70 (BP Location: Left Arm, Patient Position: Sitting, Cuff Size: Large)    Pulse (!) 58    Ht '5\' 7"'$  (1.702 m)    Wt 195 lb (88.5 kg)    SpO2 100%    BMI 30.54 kg/m                 Constitutional: Pt appears in NAD               HENT: Head: NCAT.                Right Ear: External ear normal.                 Left Ear: External ear normal.                Eyes: . Pupils are equal, round, and reactive to light. Conjunctivae and EOM are normal               Nose: without d/c or deformity               Neck: Neck supple. Gross normal ROM               Cardiovascular: Normal rate and regular rhythm.                 Pulmonary/Chest: Effort normal and breath sounds without rales or wheezing.                Abd:  Soft, NT, ND, + BS, no organomegaly;  spine nontender               Neurological: Pt is alert. At baseline orientation, motor grossly intact               Skin: Skin is warm. No rashes, no other new lesions, LE edema - trace bilateral               Psychiatric: Pt behavior is normal without agitation   Micro: none  Cardiac tracings I have personally interpreted today:  none  Pertinent Radiological findings (summarize): none   Lab Results  Component Value Date   WBC 3.7 (L) 06/22/2021   HGB 12.8 (L) 06/22/2021   HCT 39.7 06/22/2021   PLT 128.0 (L)  06/22/2021   GLUCOSE 85 06/22/2021   CHOL 150 06/22/2021   TRIG 86.0 06/22/2021   HDL 60.80 06/22/2021   LDLCALC 72 06/22/2021   ALT 20 06/22/2021   AST 24 06/22/2021   NA 139 06/22/2021   K 4.9 06/22/2021   CL 104 06/22/2021    CREATININE 1.39 06/22/2021   BUN 19 06/22/2021   CO2 26 06/22/2021   TSH 1.05 06/22/2021   PSA 0.68 06/22/2021   HGBA1C 5.8 06/22/2021   Assessment/Plan:  [name] is a 76 y.o. Black or African American [2] male with  has a past medical history of Acute hypoxemic respiratory failure due to COVID-19 (48/04/6551), Acute metabolic encephalopathy (74/82/7078), Chronic constipation, CKD (chronic kidney disease), Cognitive deficits following cerebrovascular disease (06/08/2020), Essential hypertension (02/17/2010), History of COVID-19 (11/19/2019), Hyperbilirubinemia, Hypotension (11/18/2019), Hypoxia (11/21/2019), Leukocytosis (10/10/2019), Major depressive disorder (12/30/2013), Personal history of colonic polyps (12/30/2013), PTSD (post-traumatic stress disorder), Thrombocytopenia (10/10/2019), Tubular adenoma of colon (2014), Visual hallucinations (03/2020), and Vitamin D deficiency (08/07/2019).  Vitamin D deficiency Last vitamin D Lab Results  Component Value Date   VD25OH 49.91 12/22/2020   Stable, cont oral replacement   Encounter for well adult exam with abnormal findings Age and sex appropriate education and counseling updated with regular exercise and diet Referrals for preventative services - none needed Immunizations addressed - declines flu shot, shingirx, pneumovax, tdap Smoking counseling  - none needed Evidence for depression or other mood disorder - none significant Most recent labs reviewed. I have personally reviewed and have noted: 1) the patient's medical and social history 2) The patient's current medications and supplements 3) The patient's height, weight, and BMI have been recorded in the chart   Labile hypertension BP Readings from Last 3 Encounters:  06/22/21 120/70  03/01/21 140/80  01/19/21 (!) 160/86   Stable, pt to continue medical treatment losartan hydralazine   CKD (chronic kidney disease), stage III Lab Results  Component Value Date    CREATININE 1.39 06/22/2021   Stable overall, cont to avoid nephrotoxins   Low back pain Acute onset, mild, no neuro changes and exam benign, I asked pt to take tylenol prn and f/u with sports medicine on the first floor, will hold on imaging for now  Followup: Return in about 6 months (around 12/23/2021).  Cathlean Cower, MD 06/26/2021 4:27 PM [facility]

## 2021-06-22 NOTE — Assessment & Plan Note (Signed)
Last vitamin D ?Lab Results  ?Component Value Date  ? VD25OH 49.91 12/22/2020  ? ?Stable, cont oral replacement ? ?

## 2021-06-26 ENCOUNTER — Encounter: Payer: Self-pay | Admitting: Internal Medicine

## 2021-06-26 NOTE — Assessment & Plan Note (Signed)
Acute onset, mild, no neuro changes and exam benign, I asked pt to take tylenol prn and f/u with sports medicine on the first floor, will hold on imaging for now ?

## 2021-06-26 NOTE — Assessment & Plan Note (Signed)
BP Readings from Last 3 Encounters:  ?06/22/21 120/70  ?03/01/21 140/80  ?01/19/21 (!) 160/86  ? ?Stable, pt to continue medical treatment losartan hydralazine ? ?

## 2021-06-26 NOTE — Assessment & Plan Note (Signed)
Lab Results  ?Component Value Date  ? CREATININE 1.39 06/22/2021  ? ?Stable overall, cont to avoid nephrotoxins ? ?

## 2021-06-26 NOTE — Assessment & Plan Note (Signed)
Age and sex appropriate education and counseling updated with regular exercise and diet ?Referrals for preventative services - none needed ?Immunizations addressed - declines flu shot, shingirx, pneumovax, tdap ?Smoking counseling  - none needed ?Evidence for depression or other mood disorder - none significant ?Most recent labs reviewed. ?I have personally reviewed and have noted: ?1) the patient's medical and social history ?2) The patient's current medications and supplements ?3) The patient's height, weight, and BMI have been recorded in the chart ? ?

## 2021-06-28 ENCOUNTER — Telehealth: Payer: Self-pay | Admitting: Internal Medicine

## 2021-06-28 NOTE — Telephone Encounter (Signed)
Pts friend inquiring why pts 3-08 lab results shows gfr is low ? ?Caller is not listed on dpr, not pt information was released ? ?

## 2021-06-28 NOTE — Progress Notes (Signed)
? ? [name] ?[facility] ?[place] ?Phone: [phone] ?  ?Assessment and Plan:   ?  ?1. 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  Patient has stable CKD stage III with last GFR >45.  Advised to hydrate well over the next week and take food with medication.  Do not recommend prolonged NSAID use due to CKD ?- After 1 week of meloxicam, discontinue meloxicam and start taking Tylenol 500 mg twice daily for day-to-day pain relief ?- Start HEP for low back ?- Recommend using seat cushion on hard surfaces ?- X-rays obtained in clinic.  My interpretation: No acute fracture or dislocation or vertebral collapse.  Sacralization of L5.  Chronic appearing cortical changes along pelvic crest, bilateral femoral heads, acetabulum bilaterally ?  ?Pertinent previous records reviewed include PCP note 06/22/2021 ?  ?Follow Up: 3 to 4 weeks for reevaluation.  Could consider OMT versus PT versus advanced imaging ?  ?Subjective:   ?I, [name], am serving as a Education administrator for [employer] ? ?Chief Complaint: lumbar/ buttock pain  ? ?HPI:  ?06/29/2021 ?Patient is a 76 year old male complaining of lumbar/buttock pain. Patient states low back pain for 6 months now mild to mod, constant, across the lower back with radiation to both buttocks but no LE pain, weakness, numbness unless he sits for a long time , feels like he's been on the football field and got beat up, it hurts to sit thinks it might be arthritis , has not been taking any meds  ? ?Relevant Historical Information: CKD stage III, hypertension ? ?Additional pertinent  review of systems negative. ? ? ?Current Outpatient Medications:  ?  acetaminophen (TYLENOL) 325 MG tablet, Take 2 tablets (650 mg total) by mouth every 6 (six) hours as needed for mild pain or headache (fever >/= 101)., Disp: , Rfl:  ?  albuterol (VENTOLIN HFA) 108 (90 Base) MCG/ACT inhaler, Inhale 1-2 puffs into the lungs every 4 (four) hours as needed for wheezing or shortness of breath., Disp: , Rfl:  ?  Artificial Tear Ointment (DRY EYES OP), Place 1 drop into both eyes daily as needed (dry eyes)., Disp: , Rfl:  ?  Cholecalciferol (VITAMIN D3) 50 MCG (2000 UT) capsule, Take 2,000 Units by mouth daily., Disp: , Rfl:  ?  hydrALAZINE (APRESOLINE) 25 MG tablet, Take 1 tablet (25 mg total) by mouth 3 (three) times daily., Disp: 90 tablet, Rfl: 11 ?  lactulose (CHRONULAC) 10 GM/15ML solution, Take 30 mLs (20 g total) by mouth 2 (two) times daily as needed for moderate constipation or severe constipation., Disp: 236 mL, Rfl: 0 ?  losartan (COZAAR) 100 MG tablet, Take 1 tablet (100 mg total) by mouth daily., Disp: 90 tablet, Rfl: 3 ?  meloxicam (MOBIC) 15 MG tablet, Take 0.5 tablets (7.5 mg total) by mouth daily., Disp: 7 tablet, Rfl: 0 ?  Multiple Vitamins-Minerals (ONE-A-DAY MENS 50+) TABS, Take 1 tablet by mouth daily., Disp: , Rfl:  ?  QUEtiapine (SEROQUEL) 50 MG tablet, Take 1 tablet (50 mg total) by mouth at bedtime., Disp: 90 tablet, Rfl: 1 ?  vitamin B-12 (CYANOCOBALAMIN) 1000 MCG tablet, Take 1,000 mcg by mouth  daily., Disp: , Rfl:  ?  vitamin C (ASCORBIC ACID) 500 MG tablet, Take 500 mg by mouth daily., Disp: , Rfl:   ? ?Objective:   ?  ?Vitals:  ? 06/29/21 0759  ?BP: 132/72  ?Pulse: (!) 54  ?SpO2: 99%  ?Weight: 195 lb (88.5 kg)  ?Height: '5\' 7"'$  (1.702 m)  ?  ?  ?Body mass index is 30.54 kg/m?.  ?  ?Physical Exam:   ? ?Gen: Appears well, nad, nontoxic and pleasant ?Psych: Alert and oriented, appropriate mood and affect ?Neuro: sensation intact, strength is 5/5 in upper and lower extremities, muscle tone wnl.   Left-sided radicular symptoms were not reproducible on physical exam today, but patient states he will have numbness and tingling occasionally through the left leg ?Skin: no susupicious lesions or rashes ? ?Back - Normal skin, Spine with normal alignment and no deformity.   ?No tenderness to vertebral process palpation.   ?Paraspinous muscles are not tender and without spasm ?Straight leg raise negative ?Trendelenberg negative  ? ? ?Electronically signed by:  ?[name] ?[facility] ?8:37 AM 06/29/21 ?

## 2021-06-28 NOTE — Telephone Encounter (Signed)
Not sure if we are allowed to speak in detail ? ?I can say the number is actually slightly improved to about the same from the last test, and has essentially been no change of many months, so this is not new and we are following kidney function ?

## 2021-06-29 ENCOUNTER — Ambulatory Visit (INDEPENDENT_AMBULATORY_CARE_PROVIDER_SITE_OTHER): Payer: BC Managed Care – PPO

## 2021-06-29 ENCOUNTER — Other Ambulatory Visit: Payer: Self-pay

## 2021-06-29 ENCOUNTER — Ambulatory Visit: Payer: BC Managed Care – PPO | Admitting: Sports Medicine

## 2021-06-29 VITALS — BP 132/72 | HR 54 | Ht 67.0 in | Wt 195.0 lb

## 2021-06-29 DIAGNOSIS — M5442 Lumbago with sciatica, left side: Secondary | ICD-10-CM | POA: Diagnosis not present

## 2021-06-29 DIAGNOSIS — G8929 Other chronic pain: Secondary | ICD-10-CM

## 2021-06-29 DIAGNOSIS — M545 Low back pain, unspecified: Secondary | ICD-10-CM

## 2021-06-29 DIAGNOSIS — M5136 Other intervertebral disc degeneration, lumbar region: Secondary | ICD-10-CM | POA: Diagnosis not present

## 2021-06-29 MED ORDER — MELOXICAM 15 MG PO TABS: 7.5000 mg | ORAL_TABLET | Freq: Every day | ORAL | 0 refills | Status: DC

## 2021-06-29 NOTE — Telephone Encounter (Signed)
Patient notified of results.

## 2021-06-29 NOTE — Patient Instructions (Addendum)
Good to see you  ?Start meloxicam 7.5  mg daily x1 week.  ?After the one week . You may use Tylenol 500 mg 2 times a day for breakthrough pain. ?Low back HEP  ? ?

## 2021-07-01 ENCOUNTER — Ambulatory Visit: Payer: BC Managed Care – PPO | Admitting: Family Medicine

## 2021-07-31 ENCOUNTER — Emergency Department (HOSPITAL_COMMUNITY): Payer: BC Managed Care – PPO

## 2021-07-31 ENCOUNTER — Other Ambulatory Visit: Payer: Self-pay

## 2021-07-31 ENCOUNTER — Encounter (HOSPITAL_COMMUNITY): Payer: Self-pay | Admitting: Emergency Medicine

## 2021-07-31 ENCOUNTER — Emergency Department (HOSPITAL_COMMUNITY)
Admission: EM | Admit: 2021-07-31 | Discharge: 2021-07-31 | Disposition: A | Discharge status: Home / Self Care | Payer: BC Managed Care – PPO | Attending: Emergency Medicine | Admitting: Emergency Medicine

## 2021-07-31 DIAGNOSIS — Z79899 Other long term (current) drug therapy: Secondary | ICD-10-CM | POA: Diagnosis not present

## 2021-07-31 DIAGNOSIS — R55 Syncope and collapse: Secondary | ICD-10-CM

## 2021-07-31 DIAGNOSIS — I10 Essential (primary) hypertension: Secondary | ICD-10-CM | POA: Diagnosis not present

## 2021-07-31 DIAGNOSIS — R42 Dizziness and giddiness: Secondary | ICD-10-CM | POA: Diagnosis not present

## 2021-07-31 LAB — BASIC METABOLIC PANEL
Anion gap: 8 (ref 5–15)
BUN: 15 mg/dL (ref 8–23)
CO2: 27 mmol/L (ref 22–32)
Calcium: 9.1 mg/dL (ref 8.9–10.3)
Chloride: 101 mmol/L (ref 98–111)
Creatinine, Ser: 1.9 mg/dL — ABNORMAL HIGH (ref 0.61–1.24)
GFR, Estimated: 36 mL/min — ABNORMAL LOW (ref >60)
Glucose, Bld: 106 mg/dL — ABNORMAL HIGH (ref 70–99)
Potassium: 4.7 mmol/L (ref 3.5–5.1)
Sodium: 136 mmol/L (ref 135–145)

## 2021-07-31 LAB — CBC
HCT: 37.7 % — ABNORMAL LOW (ref 39.0–52.0)
Hemoglobin: 12.2 g/dL — ABNORMAL LOW (ref 13.0–17.0)
MCH: 29.5 pg (ref 26.0–34.0)
MCHC: 32.4 g/dL (ref 30.0–36.0)
MCV: 91.3 fL (ref 80.0–100.0)
Platelets: 125 10*3/uL — ABNORMAL LOW (ref 150–400)
RBC: 4.13 MIL/uL — ABNORMAL LOW (ref 4.22–5.81)
RDW: 13.5 % (ref 11.5–15.5)
WBC: 4.8 10*3/uL (ref 4.0–10.5)
nRBC: 0 % (ref 0.0–0.2)

## 2021-07-31 LAB — CBG MONITORING, ED: Glucose-Capillary: 106 mg/dL — ABNORMAL HIGH (ref 70–99)

## 2021-07-31 LAB — TROPONIN I (HIGH SENSITIVITY): Troponin I (High Sensitivity): 8 ng/L (ref <18)

## 2021-07-31 MED ORDER — HYDRALAZINE HCL 25 MG PO TABS
25.0000 mg | ORAL_TABLET | Freq: Three times a day (TID) | ORAL | 1 refills | Status: DC
Start: 2021-07-31 — End: 2021-08-03

## 2021-07-31 MED ORDER — HYDRALAZINE HCL 10 MG PO TABS: 10.0000 mg | ORAL_TABLET | Freq: Three times a day (TID) | ORAL | 0 refills | Status: DC

## 2021-07-31 NOTE — ED Provider Notes (Signed)
?[facility] ?Provider Note ? ? ?CSN: [id number] ?Arrival date & time: 07/31/21  1742 ? ?  ? ?History ? ?Chief Complaint  ?Patient presents with  ? Near Syncope  ? ? ?[name] is a 76 y.o. male. ? ? ?Near Syncope ?Patient is a 76 year old male with a past medical history significant for labile hypertension he is presented to emergency room today with complaints of near syncopal episode around 2:30 PM this afternoon.  Seems that he had taken his second dose of hydralazine which he takes for blood pressure control about 30 minutes prior and states that he has been sitting for a while in a chair stood up to walk into the kitchen and felt very lightheaded and "swimmy "he states that he did not lose consciousness but states that he felt wobbly and had to lower himself slowly to the ground.  He states that his symptoms then resolved.  He denies any weakness or numbness of any extremity slurred speech confusion.  His wife was with him at the time and states that he seemed to be moving very slowly. ? ?Patient and family member at bedside seem a little bit uncertain of the timeline of his medications I reviewed EMR and it seems that he has been on hydralazine since 01/19/2021 approximately 6 months ago.  Seems that he has been taking this as needed -in other words when blood pressure is above 150 ? ?  ? ?Home Medications ?Prior to Admission medications   ?Medication Sig Start Date End Date Taking? Authorizing Provider  ?acetaminophen (TYLENOL) 325 MG tablet Take 2 tablets (650 mg total) by mouth every 6 (six) hours as needed for mild pain or headache (fever >/= 101). 12/09/19  Yes Guilford Shi, MD  ?Artificial Tear Ointment (DRY EYES OP) Place 1 drop into both eyes daily as needed (dry eyes).   Yes [provider]  ?Cholecalciferol (VITAMIN D3) 50 MCG (2000 UT) capsule Take 2,000 Units by mouth daily.   Yes [provider]  ?hydrALAZINE (APRESOLINE) 25 MG  tablet Take 1 tablet (25 mg total) by mouth 3 (three) times daily. 07/31/21  Yes Que Meneely, Ova Freshwater S, PA  ?lactulose (CHRONULAC) 10 GM/15ML solution Take 30 mLs (20 g total) by mouth 2 (two) times daily as needed for moderate constipation or severe constipation. 12/09/19  Yes Guilford Shi, MD  ?Multiple Vitamins-Minerals (ONE-A-DAY MENS 50+) TABS Take 1 tablet by mouth daily.   Yes [provider]  ?QUEtiapine (SEROQUEL) 50 MG tablet Take 1 tablet (50 mg total) by mouth at bedtime. 06/22/21  Yes Biagio Borg, MD  ?vitamin B-12 (CYANOCOBALAMIN) 1000 MCG tablet Take 1,000 mcg by mouth daily.   Yes [provider]  ?vitamin C (ASCORBIC ACID) 500 MG tablet Take 500 mg by mouth daily.   Yes [provider]  ?albuterol (VENTOLIN HFA) 108 (90 Base) MCG/ACT inhaler Inhale 1-2 puffs into the lungs every 4 (four) hours as needed for wheezing or shortness of breath. 12/09/19   Guilford Shi, MD  ?meloxicam (MOBIC) 15 MG tablet Take 0.5 tablets (7.5 mg total) by mouth daily. ?Patient not taking: Reported on 07/31/2021 06/29/21   Glennon Mac, DO  ?   ? ?Allergies    ?Lisinopril   ? ?Review of Systems   ?Review of Systems  ?Cardiovascular:  Positive for near-syncope.  ? ?Physical Exam ?Updated Vital Signs ?BP (!) 160/98   Pulse (!) 59   Temp 98.6 ?F (37 ?C) (Oral)   Resp Marland Kitchen)  21   SpO2 99%  ?Physical Exam ?Vitals and nursing note reviewed.  ?Constitutional:   ?   General: He is not in acute distress. ?HENT:  ?   Head: Normocephalic and atraumatic.  ?   Nose: Nose normal.  ?   Mouth/Throat:  ?   Mouth: Mucous membranes are moist.  ?Eyes:  ?   General: No scleral icterus. ?Cardiovascular:  ?   Rate and Rhythm: Normal rate and regular rhythm.  ?   Pulses: Normal pulses.  ?   Heart sounds: Normal heart sounds.  ?Pulmonary:  ?   Effort: Pulmonary effort is normal. No respiratory distress.  ?   Breath sounds: No wheezing.  ?Abdominal:  ?   Palpations: Abdomen is soft.  ?   Tenderness: There is no  abdominal tenderness. There is no guarding or rebound.  ?Musculoskeletal:  ?   Cervical back: Normal range of motion.  ?   Right lower leg: No edema.  ?   Left lower leg: No edema.  ?   Comments: Legs are symmetric, no lower extremity edema.  ?Skin: ?   General: Skin is warm and dry.  ?   Capillary Refill: Capillary refill takes less than 2 seconds.  ?Neurological:  ?   Mental Status: He is alert. Mental status is at baseline.  ?Psychiatric:     ?   Mood and Affect: Mood normal.     ?   Behavior: Behavior normal.  ? ? ?ED Results / Procedures / Treatments   ?Labs ?(all labs ordered are listed, but only abnormal results are displayed) ?Labs Reviewed  ?BASIC METABOLIC PANEL - Abnormal; Notable for the following components:  ?    Result Value  ? Glucose, Bld 106 (*)   ? Creatinine, Ser 1.90 (*)   ? GFR, Estimated 36 (*)   ? All other components within normal limits  ?CBC - Abnormal; Notable for the following components:  ? RBC 4.13 (*)   ? Hemoglobin 12.2 (*)   ? HCT 37.7 (*)   ? Platelets 125 (*)   ? All other components within normal limits  ?CBG MONITORING, ED - Abnormal; Notable for the following components:  ? Glucose-Capillary 106 (*)   ? All other components within normal limits  ?URINALYSIS, ROUTINE W REFLEX MICROSCOPIC  ?TROPONIN I (HIGH SENSITIVITY)  ?TROPONIN I (HIGH SENSITIVITY)  ? ? ?EKG ?EKG Interpretation ? ?Date/Time:  Sunday July 31 2021 18:06:20 EDT ?Ventricular Rate:  65 ?PR Interval:  172 ?QRS Duration: 90 ?QT Interval:  410 ?QTC Calculation: 426 ?R Axis:   11 ?Text Interpretation: Normal sinus rhythm Normal ECG When compared with ECG of 16-Jan-2021 18:27, PREVIOUS ECG IS PRESENT No significant change since last tracing Confirmed by Blanchie Dessert [phone]) on 07/31/2021 10:02:09 PM ? ?Radiology ?DG Chest 2 View ? ?Result Date: 07/31/2021 ?CLINICAL DATA:  Syncope, generalized weakness EXAM: CHEST - 2 VIEW COMPARISON:  01/16/2021 FINDINGS: Lungs are clear. No pneumothorax or pleural effusion.  Tortuous of the thoracic aorta is stable. Cardiac size within normal limits. Pulmonary vascularity is normal. No acute bone abnormality. IMPRESSION: No active cardiopulmonary disease. Electronically Signed   By: Fidela Salisbury M.D.   On: 07/31/2021 20:15  ? ?CT HEAD WO CONTRAST (5MM) ? ?Result Date: 07/31/2021 ?CLINICAL DATA:  Pt reports syncopal episode this evening. States he felt dizzy and fell down to his knees. Neuro deficit, acute, stroke suspected EXAM: CT HEAD WITHOUT CONTRAST TECHNIQUE: Contiguous axial images were obtained from the base of the  skull through the vertex without intravenous contrast. RADIATION DOSE REDUCTION: This exam was performed according to the departmental dose-optimization program which includes automated exposure control, adjustment of the mA and/or kV according to patient size and/or use of iterative reconstruction technique. COMPARISON:  MRI head 05/27/2020, CT head 11/18/2019 BRAIN: BRAIN Patchy and confluent areas of decreased attenuation are noted throughout the deep and periventricular white matter of the cerebral hemispheres bilaterally, compatible with chronic microvascular ischemic disease. No evidence of large-territorial acute infarction. No parenchymal hemorrhage. No mass lesion. No extra-axial collection. No mass effect or midline shift. No hydrocephalus. Basilar cisterns are patent. Vascular: No hyperdense vessel. Skull: No acute fracture or focal lesion. Sinuses/Orbits: Paranasal sinuses and mastoid air cells are clear. The orbits are unremarkable. Other: None. IMPRESSION: No acute intracranial abnormality. Electronically Signed   By: Iven Finn M.D.   On: 07/31/2021 19:39   ? ?Procedures ?Procedures  ? ? ?Medications Ordered in ED ?Medications - No data to display ? ?ED Course/ Medical Decision Making/ A&P ?Clinical Course as of 07/31/21 2313  ?Sun Jul 31, 2021  ?2125 CT head and chest x-ray personally viewed images for these studies and do not see any acute  abnormality. [WF]  ?  ?Clinical Course User Index ?[WF] Tedd Sias, [place]  ? ?                        ?Medical Decision Making ?Amount and/or Complexity of Data Reviewed ?Labs: ordered. ? ? ?This patient presents to the E

## 2021-07-31 NOTE — ED Triage Notes (Signed)
Pt reports syncopal episode this evening.  States he felt dizzy and fell down to his knees.  Denies injury.  Denies dizziness at present.  Reports L leg pain that has been going on for "a while".  No arm drift.  Denies numbness/weakness. ?

## 2021-07-31 NOTE — ED Provider Triage Note (Signed)
Emergency Medicine Provider Triage Evaluation Note ? ?[name] , a 76 y.o. male  was evaluated in triage.  Pt complains of low blood pressure and feeling like his left leg was weak.  He states that at about noon today he was checking his blood pressure as he usually does to maintain a log and his systolic was in the 06T.  He then tried to stand up and he felt like his left leg was very weak.  He states this caused him to collapse to the ground.  He did not lose consciousness.  He denies any injuries, did not strike his head.  He states that this lasted for about 5 minutes.  Currently he feels back to normal. ? ? ? ?Physical Exam  ?BP 122/81   Pulse 65   Temp 98.5 ?F (36.9 ?C) (Oral)   Resp 18   SpO2 99%  ?Gen:   Awake, no distress   ?Resp:  Normal effort  ?MSK:   Moves extremities without difficulty  ?Other:  Normal speech.  5/5 strength bilateral upper and lower extremities.  Speech is not slurred. ? ?Medical Decision Making  ?Medically screening exam initiated at 6:39 PM.  Appropriate orders placed.  [name] was informed that the remainder of the evaluation will be completed by another provider, this initial triage assessment does not replace that evaluation, and the importance of remaining in the ED until their evaluation is complete. ? ? ?  ?Lorin Glass, [place] ?07/31/21 1840 ? ?

## 2021-07-31 NOTE — ED Notes (Signed)
Charge rn notified of blood pressure drop ?

## 2021-07-31 NOTE — Discharge Instructions (Signed)
You will need to stop taking the Cozaar/losartan '100mg'$  tablet.  ? ?You will continue taking hydralazine as needed for elevated blood pressure greater than 014 systolic ? ?You always return the emergency room for any new or concerning symptoms. ?

## 2021-07-31 NOTE — ED Notes (Signed)
Patient in a scan right now ?

## 2021-08-03 ENCOUNTER — Encounter: Payer: Self-pay | Admitting: Internal Medicine

## 2021-08-03 ENCOUNTER — Ambulatory Visit (INDEPENDENT_AMBULATORY_CARE_PROVIDER_SITE_OTHER): Payer: BC Managed Care – PPO | Admitting: Internal Medicine

## 2021-08-03 VITALS — BP 112/68 | HR 61 | Ht 67.0 in | Wt 189.0 lb

## 2021-08-03 DIAGNOSIS — D696 Thrombocytopenia, unspecified: Secondary | ICD-10-CM

## 2021-08-03 DIAGNOSIS — N179 Acute kidney failure, unspecified: Secondary | ICD-10-CM | POA: Diagnosis not present

## 2021-08-03 DIAGNOSIS — E559 Vitamin D deficiency, unspecified: Secondary | ICD-10-CM

## 2021-08-03 DIAGNOSIS — R0989 Other specified symptoms and signs involving the circulatory and respiratory systems: Secondary | ICD-10-CM

## 2021-08-03 LAB — BASIC METABOLIC PANEL
BUN: 18 mg/dL (ref 6–23)
CO2: 32 mEq/L (ref 19–32)
Calcium: 9.6 mg/dL (ref 8.4–10.5)
Chloride: 103 mEq/L (ref 96–112)
Creatinine, Ser: 1.41 mg/dL (ref 0.40–1.50)
GFR: 48.36 mL/min — ABNORMAL LOW (ref >60.00)
Glucose, Bld: 95 mg/dL (ref 70–99)
Potassium: 4.7 mEq/L (ref 3.5–5.1)
Sodium: 139 mEq/L (ref 135–145)

## 2021-08-03 NOTE — Patient Instructions (Addendum)
It appears you likely have some dehydration and lower BP recently to cause the symptoms where you went to the ED ? ?Ok to stay OFF the losartan and the hydralazine for now, as they are too much in addition to the dehydration ? ?Please continue all other medications as before, and refills have been done if requested. ? ?Please have the pharmacy call with any other refills you may need ? ?Please keep your appointments with your specialists as you may have planned ? ?Please go to the LAB at the blood drawing area for the tests to be done - just the kidney function today ? ?You will be contacted by phone if any changes need to be made immediately.  Otherwise, you will receive a letter about your results with an explanation, but please check with MyChart first. ? ?Please remember to sign up for MyChart if you have not done so, as this will be important to you in the future with finding out test results, communicating by private email, and scheduling acute appointments online when needed. ? ?Please make an Appointment to return in 1 months, or sooner if needed ?

## 2021-08-03 NOTE — Progress Notes (Signed)
Patient ID: [name], male   DOB: Aug 26, 1945, 76 y.o.   MRN: [id number] ? ? ? ?    Chief Complaint: follow up post ED visit x 3 days with low BP ? ?     HPI:  [name] is a 76 y.o. male here after recent lower BP and orthostatic symptoms with AKI on labs; has remained mild dizzy on standing since that time,  Pt denies chest pain, increased sob or doe, wheezing, orthopnea, PND, increased LE swelling, palpitations, or syncope.   Pt denies polydipsia, polyuria, or new focal neuro s/s.    Pt denies fever, wt loss, night sweats, loss of appetite, or other constitutional symptoms  Denies urinary symptoms such as dysuria, frequency, urgency, flank pain, hematuria or n/v, fever, chills.  Denies worsening reflux, abd pain, dysphagia, n/v, bowel change or blood.  Wife continues to be persistent about his BP being higher at home and wanting more medicaiton for him  Taking Vit D.  No worsening overt bleeding or bruising ?      ?Wt Readings from Last 3 Encounters:  ?08/03/21 189 lb (85.7 kg)  ?06/29/21 195 lb (88.5 kg)  ?06/22/21 195 lb (88.5 kg)  ? ?BP Readings from Last 3 Encounters:  ?08/03/21 112/68  ?07/31/21 (!) 160/98  ?06/29/21 132/72  ? ?      ?Past Medical History:  ?Diagnosis Date  ? Acute hypoxemic respiratory failure due to COVID-19 11/18/2019  ? Acute metabolic encephalopathy 66/09/3014  ? related to COVID-19 infection  ? Chronic constipation   ? CKD (chronic kidney disease)   ? stage III  ? 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 Essential hypertension 02/17/2010  ? Previously on lisinopril-HCTZ but this was discontinued earlier this year due to angioedema. Currently on nifedipine 30 mg daily. Serial BPs: -02/03/13: 118/82.    ? History of COVID-19 11/19/2019  ? Hyperbilirubinemia   ? Hypotension 11/18/2019  ? Hypoxia 11/21/2019  ? related to COVID-19 infection  ? Leukocytosis 10/10/2019  ? Major depressive disorder 12/30/2013  ? Personal history of colonic polyps 12/30/2013   ? Per colonoscopy at [facility] per pt, about sept 2014  ? PTSD (post-traumatic stress disorder)   ? saw combat during [place] war  ? Thrombocytopenia 10/10/2019  ? Tubular adenoma of colon 2014  ? Visual hallucinations 03/2020  ? Vitamin D deficiency 08/07/2019  ? ?Past Surgical History:  ?Procedure Laterality Date  ? COLONOSCOPY    ? MUSCLE RECESSION AND RESECTION Left 12/25/2018  ? Procedure: INFERIOR OBLIQUE RECESSION LEFT EYE, SUPERIOR OBLIQUE TUCK LEFT EYE;  Surgeon: Gevena Cotton, MD;  Location: [facility];  Service: Ophthalmology;  Laterality: Left;  ? UPPER GASTROINTESTINAL ENDOSCOPY    ? ? reports that he has never smoked. He has never used smokeless tobacco. He reports that he does not drink alcohol and does not use drugs. ?family history includes Hypertension in an other family member. ?Allergies  ?Allergen Reactions  ? Lisinopril   ?  Angioedema? Patient reports that his tongue and mouth became very swollen, was seen at [facility] for this in 2014.   ? ?Current Outpatient Medications on File Prior to Visit  ?Medication Sig Dispense Refill  ? acetaminophen (TYLENOL) 325 MG tablet Take 2 tablets (650 mg total) by mouth every 6 (six) hours as needed for mild pain or headache (fever >/= 101).    ? albuterol (VENTOLIN HFA) 108 (90 Base) MCG/ACT inhaler Inhale 1-2 puffs into the lungs every 4 (four) hours as needed  for wheezing or shortness of breath.    ? Artificial Tear Ointment (DRY EYES OP) Place 1 drop into both eyes daily as needed (dry eyes).    ? Cholecalciferol (VITAMIN D3) 50 MCG (2000 UT) capsule Take 2,000 Units by mouth daily.    ? lactulose (CHRONULAC) 10 GM/15ML solution Take 30 mLs (20 g total) by mouth 2 (two) times daily as needed for moderate constipation or severe constipation. 236 mL 0  ? Multiple Vitamins-Minerals (ONE-A-DAY MENS 50+) TABS Take 1 tablet by mouth daily.    ? QUEtiapine (SEROQUEL) 50 MG tablet Take 1 tablet (50 mg total) by mouth at bedtime. 90 tablet 1  ? vitamin B-12  (CYANOCOBALAMIN) 1000 MCG tablet Take 1,000 mcg by mouth daily.    ? vitamin C (ASCORBIC ACID) 500 MG tablet Take 500 mg by mouth daily.    ? meloxicam (MOBIC) 15 MG tablet Take 0.5 tablets (7.5 mg total) by mouth daily. (Patient not taking: Reported on 07/31/2021) 7 tablet 0  ? ?No current facility-administered medications on file prior to visit.  ? ?     ROS:  All others reviewed and negative. ? ?Objective  ? ?     PE:  BP 112/68 (BP Location: Right Arm, Patient Position: Sitting, Cuff Size: Large)   Pulse 61   Ht '5\' 7"'$  (1.702 m)   Wt 189 lb (85.7 kg)   SpO2 98%   BMI 29.60 kg/m?  ? ?              Constitutional: Pt appears in NAD ?              HENT: Head: NCAT.  ?              Right Ear: External ear normal.   ?              Left Ear: External ear normal.  ?              Eyes: . Pupils are equal, round, and reactive to light. Conjunctivae and EOM are normal ?              Nose: without d/c or deformity ?              Neck: Neck supple. Gross normal ROM ?              Cardiovascular: Normal rate and regular rhythm.   ?              Pulmonary/Chest: Effort normal and breath sounds without rales or wheezing.  ?              Abd:  Soft, NT, ND, + BS, no organomegaly ?              Neurological: Pt is alert. At baseline orientation, motor grossly intact ?              Skin: Skin is warm. No rashes, no other new lesions, LE edema - trace bilateral ?              Psychiatric: Pt behavior is normal without agitation  ? ?Micro: none ? ?Cardiac tracings I have personally interpreted today:  none ? ?Pertinent Radiological findings (summarize): none  ? ?Lab Results  ?Component Value Date  ? WBC 4.8 07/31/2021  ? HGB 12.2 (L) 07/31/2021  ? HCT 37.7 (L) 07/31/2021  ? PLT 125 (L) 07/31/2021  ? GLUCOSE 95 08/03/2021  ? CHOL 150 06/22/2021  ?  TRIG 86.0 06/22/2021  ? HDL 60.80 06/22/2021  ? LDLCALC 72 06/22/2021  ? ALT 20 06/22/2021  ? AST 24 06/22/2021  ? NA 139 08/03/2021  ? K 4.7 08/03/2021  ? CL 103 08/03/2021  ?  CREATININE 1.41 08/03/2021  ? BUN 18 08/03/2021  ? CO2 32 08/03/2021  ? TSH 1.05 06/22/2021  ? PSA 0.68 06/22/2021  ? HGBA1C 5.8 06/22/2021  ? ?Assessment/Plan:  ?[name] is a 76 y.o. Black or African American [2] male with  has a past medical history of Acute hypoxemic respiratory failure due to COVID-19 (29/52/8413), Acute metabolic encephalopathy (24/40/1027), Chronic constipation, CKD (chronic kidney disease), Cognitive deficits following cerebrovascular disease (06/08/2020), Essential hypertension (02/17/2010), History of COVID-19 (11/19/2019), Hyperbilirubinemia, Hypotension (11/18/2019), Hypoxia (11/21/2019), Leukocytosis (10/10/2019), Major depressive disorder (12/30/2013), Personal history of colonic polyps (12/30/2013), PTSD (post-traumatic stress disorder), Thrombocytopenia (10/10/2019), Tubular adenoma of colon (2014), Visual hallucinations (03/2020), and Vitamin D deficiency (08/07/2019). ? ?Labile hypertension ?BP Readings from Last 3 Encounters:  ?08/03/21 112/68  ?07/31/21 (!) 160/98  ?06/29/21 132/72  ? ?Now overcontrolled it seems with symptoms of dizziness, low normal BP and AKI on lab per ED - to d/c losartan and had to explain wiffe several times he may not need further medication despite his BP higher at times, due to his symptoms on the low side requiring most recently even an ED visit ? ? ?AKI (acute kidney injury) ([place]) ?With mild acute on CKD, for d/c losartan as above, encouraged oral fluids, and recheck bmp ? ?Vitamin D deficiency ?Last vitamin D ?Lab Results  ?Component Value Date  ? VD25OH 59.08 06/22/2021  ? ?Stable, cont oral replacement ? ? ?Thrombocytopenia ?Lab Results  ?Component Value Date  ? PLT 125 (L) 07/31/2021  ? ?Chronic stable persistent, cont to follow ? ?Followup: No follow-ups on file. ? ?Cathlean Cower, MD 08/07/2021 12:53 PM ?[facility] ?[place] ?Internal Medicine ?

## 2021-08-04 ENCOUNTER — Telehealth: Payer: Self-pay | Admitting: Internal Medicine

## 2021-08-04 NOTE — Telephone Encounter (Signed)
BP control ok for now; ok for no change in tx at this time, thanks ?

## 2021-08-04 NOTE — Telephone Encounter (Signed)
Patient and patient's significant other notified ?

## 2021-08-04 NOTE — Telephone Encounter (Signed)
BP reading  ?9:00am 125/90   ?9:30am 126/91 140/89 ?

## 2021-08-04 NOTE — Telephone Encounter (Signed)
Pts friend states pt bp as of 9am is 147/98  ? ?Caller inquiring if pt should take hydralazine that was dc at yesterday's visit ? ?Please advise ?

## 2021-08-07 DIAGNOSIS — N179 Acute kidney failure, unspecified: Secondary | ICD-10-CM | POA: Insufficient documentation

## 2021-08-07 NOTE — Assessment & Plan Note (Signed)
With mild acute on CKD, for d/c losartan as above, encouraged oral fluids, and recheck bmp ?

## 2021-08-07 NOTE — Assessment & Plan Note (Signed)
BP Readings from Last 3 Encounters:  ?08/03/21 112/68  ?07/31/21 (!) 160/98  ?06/29/21 132/72  ? ?Now overcontrolled it seems with symptoms of dizziness, low normal BP and AKI on lab per ED - to d/c losartan and had to explain wiffe several times he may not need further medication despite his BP higher at times, due to his symptoms on the low side requiring most recently even an ED visit ? ?

## 2021-08-07 NOTE — Assessment & Plan Note (Signed)
Lab Results  ?Component Value Date  ? PLT 125 (L) 07/31/2021  ? ?Chronic stable persistent, cont to follow ?

## 2021-08-07 NOTE — Assessment & Plan Note (Signed)
Last vitamin D ?Lab Results  ?Component Value Date  ? VD25OH 59.08 06/22/2021  ? ?Stable, cont oral replacement ? ?

## 2021-09-01 ENCOUNTER — Encounter: Payer: Self-pay | Admitting: Internal Medicine

## 2021-09-01 ENCOUNTER — Ambulatory Visit (INDEPENDENT_AMBULATORY_CARE_PROVIDER_SITE_OTHER): Payer: BC Managed Care – PPO | Admitting: Internal Medicine

## 2021-09-01 VITALS — BP 140/80 | HR 61 | Temp 98.2°F | Ht 67.0 in | Wt 191.0 lb

## 2021-09-01 DIAGNOSIS — N1831 Chronic kidney disease, stage 3a: Secondary | ICD-10-CM

## 2021-09-01 DIAGNOSIS — R0989 Other specified symptoms and signs involving the circulatory and respiratory systems: Secondary | ICD-10-CM

## 2021-09-01 DIAGNOSIS — E559 Vitamin D deficiency, unspecified: Secondary | ICD-10-CM

## 2021-09-01 LAB — CBC WITH DIFFERENTIAL/PLATELET
Basophils Absolute: 0 10*3/uL (ref 0.0–0.1)
Basophils Relative: 0.4 % (ref 0.0–3.0)
Eosinophils Absolute: 0 10*3/uL (ref 0.0–0.7)
Eosinophils Relative: 0.4 % (ref 0.0–5.0)
HCT: 38 % — ABNORMAL LOW (ref 39.0–52.0)
Hemoglobin: 12.5 g/dL — ABNORMAL LOW (ref 13.0–17.0)
Lymphocytes Relative: 27 % (ref 12.0–46.0)
Lymphs Abs: 1.1 10*3/uL (ref 0.7–4.0)
MCHC: 32.8 g/dL (ref 30.0–36.0)
MCV: 89.1 fl (ref 78.0–100.0)
Monocytes Absolute: 0.4 10*3/uL (ref 0.1–1.0)
Monocytes Relative: 9 % (ref 3.0–12.0)
Neutro Abs: 2.6 10*3/uL (ref 1.4–7.7)
Neutrophils Relative %: 63.2 % (ref 43.0–77.0)
Platelets: 112 10*3/uL — ABNORMAL LOW (ref 150.0–400.0)
RBC: 4.26 Mil/uL (ref 4.22–5.81)
RDW: 14.2 % (ref 11.5–15.5)
WBC: 4.1 10*3/uL (ref 4.0–10.5)

## 2021-09-01 LAB — BASIC METABOLIC PANEL
BUN: 14 mg/dL (ref 6–23)
CO2: 31 mEq/L (ref 19–32)
Calcium: 9.6 mg/dL (ref 8.4–10.5)
Chloride: 104 mEq/L (ref 96–112)
Creatinine, Ser: 1.33 mg/dL (ref 0.40–1.50)
GFR: 51.84 mL/min — ABNORMAL LOW (ref >60.00)
Glucose, Bld: 86 mg/dL (ref 70–99)
Potassium: 4.4 mEq/L (ref 3.5–5.1)
Sodium: 140 mEq/L (ref 135–145)

## 2021-09-01 LAB — HEPATIC FUNCTION PANEL
ALT: 17 U/L (ref 0–53)
AST: 23 U/L (ref 0–37)
Albumin: 4.2 g/dL (ref 3.5–5.2)
Alkaline Phosphatase: 59 U/L (ref 39–117)
Bilirubin, Direct: 0.1 mg/dL (ref 0.0–0.3)
Total Bilirubin: 0.6 mg/dL (ref 0.2–1.2)
Total Protein: 7 g/dL (ref 6.0–8.3)

## 2021-09-01 LAB — VITAMIN D 25 HYDROXY (VIT D DEFICIENCY, FRACTURES): VITD: 52.61 ng/mL (ref 30.00–100.00)

## 2021-09-01 NOTE — Patient Instructions (Signed)
Despite the BP high at times at home, the low BP's mean that we should add more medication at this time for BP  Please continue all other medications as before, and refills have been done if requested.  Please have the pharmacy call with any other refills you may need.  Please continue your efforts at being more active, low cholesterol diet, and weight control.  You are otherwise up to date with prevention measures today.  Please keep your appointments with your specialists as you may have planned  Please go to the LAB at the blood drawing area for the tests to be done  You will be contacted by phone if any changes need to be made immediately.  Otherwise, you will receive a letter about your results with an explanation, but please check with MyChart first.  Please remember to sign up for MyChart if you have not done so, as this will be important to you in the future with finding out test results, communicating by private email, and scheduling acute appointments online when needed.  Please make an Appointment to return in 6 months, or sooner if needed

## 2021-09-01 NOTE — Progress Notes (Signed)
Patient ID: [name], male   DOB: [birth year], 76 y.o.   MRN: [id number]        Chief Complaint: follow up HTN, ckd, low vit d       HPI:  [name] is a 76 y.o. male here to f/u HTN and wife very good at documenting many BP in the past wk mostly 120 - 1302 but sometimes up to 180 for no apparent reason it seems.  Pt denies chest pain, increased sob or doe, wheezing, orthopnea, PND, increased LE swelling, palpitations, dizziness or syncope.  Pt had recent AKI and CKD with too low BP symptomatic requiring health care attention with losartan low dose.  Taking Vit d.         Wt Readings from Last 3 Encounters:  09/01/21 191 lb (86.6 kg)  08/03/21 189 lb (85.7 kg)  06/29/21 195 lb (88.5 kg)   BP Readings from Last 3 Encounters:  09/01/21 140/80  08/03/21 112/68  07/31/21 (!) 160/98         Past Medical History:  Diagnosis Date   Acute hypoxemic respiratory failure due to COVID-19 77/50/2774   Acute metabolic encephalopathy 12/87/8676   related to COVID-19 infection   Chronic constipation    CKD (chronic kidney disease)    stage III   Cognitive deficits following cerebrovascular disease 77/22/2022   Essential hypertension 77/06/2009   Previously on lisinopril-HCTZ but this was discontinued earlier this year due to angioedema. Currently on nifedipine 30 mg daily. Serial BPs: -02/03/13: 118/82.     History of COVID-19 77/07/2019   Hyperbilirubinemia    Hypotension 77/06/2019   Hypoxia 77/09/2019   related to COVID-19 infection   Leukocytosis 77/25/2021   Major depressive disorder 77/15/2015   Personal history of colonic polyps 77/15/2015   Per colonoscopy at [facility] per pt, about sept 2014   PTSD (post-traumatic stress disorder)    saw combat during [place] war   Thrombocytopenia 77/25/2021   Tubular adenoma of colon 77   Visual hallucinations 03/2020   Vitamin D deficiency 77/22/2021   Past Surgical History:  Procedure Laterality Date   COLONOSCOPY     MUSCLE  RECESSION AND RESECTION Left 12/25/2018   Procedure: INFERIOR OBLIQUE RECESSION LEFT EYE, SUPERIOR OBLIQUE TUCK LEFT EYE;  Surgeon: Gevena Cotton, MD;  Location: [facility];  Service: Ophthalmology;  Laterality: Left;   UPPER GASTROINTESTINAL ENDOSCOPY      reports that he has never smoked. He has never used smokeless tobacco. He reports that he does not drink alcohol and does not use drugs. family history includes Hypertension in an other family member. Allergies  Allergen Reactions   Lisinopril     Angioedema? Patient reports that his tongue and mouth became very swollen, was seen at [facility] for this in 2014.    Current Outpatient Medications on File Prior to Visit  Medication Sig Dispense Refill   acetaminophen (TYLENOL) 325 MG tablet Take 2 tablets (650 mg total) by mouth every 6 (six) hours as needed for mild pain or headache (fever >/= 101).     albuterol (VENTOLIN HFA) 108 (90 Base) MCG/ACT inhaler Inhale 1-2 puffs into the lungs every 4 (four) hours as needed for wheezing or shortness of breath.     Artificial Tear Ointment (DRY EYES OP) Place 1 drop into both eyes daily as needed (dry eyes).     Cholecalciferol (VITAMIN D3) 50 MCG (2000 UT) capsule Take 2,000 Units by mouth daily.     lactulose (CHRONULAC) 10 GM/15ML  solution Take 30 mLs (20 g total) by mouth 2 (two) times daily as needed for moderate constipation or severe constipation. 236 mL 0   meloxicam (MOBIC) 15 MG tablet Take 0.5 tablets (7.5 mg total) by mouth daily. 7 tablet 0   Multiple Vitamins-Minerals (ONE-A-DAY MENS 50+) TABS Take 1 tablet by mouth daily.     QUEtiapine (SEROQUEL) 50 MG tablet Take 1 tablet (50 mg total) by mouth at bedtime. 90 tablet 1   vitamin B-12 (CYANOCOBALAMIN) 1000 MCG tablet Take 1,000 mcg by mouth daily.     vitamin C (ASCORBIC ACID) 500 MG tablet Take 500 mg by mouth daily.     No current facility-administered medications on file prior to visit.        ROS:  All others reviewed  and negative.  Objective        PE:  BP 140/80 (BP Location: Right Arm, Patient Position: Sitting, Cuff Size: Large)   Pulse 61   Temp 98.2 F (36.8 C) (Oral)   Ht '5\' 7"'$  (1.702 m)   Wt 191 lb (86.6 kg)   SpO2 98%   BMI 29.91 kg/m                 Constitutional: Pt appears in NAD               HENT: Head: NCAT.                Right Ear: External ear normal.                 Left Ear: External ear normal.                Eyes: . Pupils are equal, round, and reactive to light. Conjunctivae and EOM are normal               Nose: without d/c or deformity               Neck: Neck supple. Gross normal ROM               Cardiovascular: Normal rate and regular rhythm.                 Pulmonary/Chest: Effort normal and breath sounds without rales or wheezing.                Abd:  Soft, NT, ND, + BS, no organomegaly               Neurological: Pt is alert. At baseline orientation, motor grossly intact               Skin: Skin is warm. No rashes, no other new lesions, LE edema - none               Psychiatric: Pt behavior is normal without agitation   Micro: none  Cardiac tracings I have personally interpreted today:  none  Pertinent Radiological findings (summarize): none   Lab Results  Component Value Date   WBC 4.1 09/01/2021   HGB 12.5 (L) 09/01/2021   HCT 38.0 (L) 09/01/2021   PLT 112.0 (L) 09/01/2021   GLUCOSE 86 09/01/2021   CHOL 150 06/22/2021   TRIG 86.0 06/22/2021   HDL 60.80 06/22/2021   LDLCALC 72 06/22/2021   ALT 17 09/01/2021   AST 23 09/01/2021   NA 140 09/01/2021   K 4.4 09/01/2021   CL 104 09/01/2021   CREATININE 1.33 09/01/2021   BUN 14 09/01/2021  CO2 31 09/01/2021   TSH 1.05 06/22/2021   PSA 0.68 06/22/2021   HGBA1C 5.8 06/22/2021   Assessment/Plan:  [name] is a 76 y.o. Black or African American [2] male with  has a past medical history of Acute hypoxemic respiratory failure due to COVID-19 (77/78/4696), Acute metabolic encephalopathy  (77/09/2019), Chronic constipation, CKD (chronic kidney disease), Cognitive deficits following cerebrovascular disease (06/08/2020), Essential hypertension (77/06/2009), History of COVID-19 (77/07/2019), Hyperbilirubinemia, Hypotension (11/18/2019), Hypoxia (11/21/2019), Leukocytosis (10/10/2019), Major depressive disorder (12/30/2013), Personal history of colonic polyps (12/30/2013), PTSD (post-traumatic stress disorder), Thrombocytopenia (10/10/2019), Tubular adenoma of colon (2014), Visual hallucinations (03/2020), and Vitamin D deficiency (08/07/2019).  Vitamin D deficiency Last vitamin D Lab Results  Component Value Date   VD25OH 59.08 06/22/2021   Stable, cont oral replacement   CKD (chronic kidney disease), stage III Lab Results  Component Value Date   CREATININE 1.33 09/01/2021   Stable overall back to baseline off losartan, cont to avoid nephrotoxins   Labile hypertension By documentation, BP quite low normal most of the time, with few BP even up to 180; dw wife and pt - we should avoid further attempted anti-HTN tx to avoid further episodes hypotension and AKI on CKD;    Followup: Return in about 6 months (around 03/04/2022).  Cathlean Cower, MD 09/04/2021 3:29 PM [facility]

## 2021-09-01 NOTE — Assessment & Plan Note (Signed)
Last vitamin D Lab Results  Component Value Date   VD25OH 59.08 06/22/2021   Stable, cont oral replacement

## 2021-09-02 ENCOUNTER — Telehealth: Payer: Self-pay | Admitting: Internal Medicine

## 2021-09-02 NOTE — Telephone Encounter (Signed)
We can hold off on that for now, but please remind me at your next visit to add to the lab testing orders to check on this

## 2021-09-02 NOTE — Telephone Encounter (Signed)
Patient would like to know if he should add iron to his regimen - please advise.

## 2021-09-04 ENCOUNTER — Encounter: Payer: Self-pay | Admitting: Internal Medicine

## 2021-09-04 NOTE — Assessment & Plan Note (Signed)
By documentation, BP quite low normal most of the time, with few BP even up to 180; dw wife and pt - we should avoid further attempted anti-HTN tx to avoid further episodes hypotension and AKI on CKD;

## 2021-09-04 NOTE — Assessment & Plan Note (Signed)
Lab Results  Component Value Date   CREATININE 1.33 09/01/2021   Stable overall back to baseline off losartan, cont to avoid nephrotoxins

## 2021-09-06 NOTE — Telephone Encounter (Signed)
Pt already came for ov MD inform about iron.Marland KitchenJohny Valdez

## 2021-10-21 ENCOUNTER — Other Ambulatory Visit (HOSPITAL_COMMUNITY): Payer: Self-pay

## 2021-10-25 ENCOUNTER — Telehealth: Payer: Self-pay

## 2021-10-25 NOTE — Telephone Encounter (Signed)
Patient Advocate Encounter  Prior Authorization for Trulance '3mg'$  has been approved.    Conni Slipper PA Case ID: [id number] Effective dates: 10-21-21 through 10-22-22  [place] Poot Patient Advocate

## 2021-12-20 ENCOUNTER — Other Ambulatory Visit: Payer: Self-pay | Admitting: Internal Medicine

## 2021-12-20 ENCOUNTER — Ambulatory Visit: Payer: BC Managed Care – PPO | Admitting: Internal Medicine

## 2021-12-20 VITALS — BP 162/96 | HR 55 | Temp 98.2°F | Ht 67.0 in | Wt 184.0 lb

## 2021-12-20 DIAGNOSIS — N393 Stress incontinence (female) (male): Secondary | ICD-10-CM

## 2021-12-20 DIAGNOSIS — N1831 Chronic kidney disease, stage 3a: Secondary | ICD-10-CM | POA: Diagnosis not present

## 2021-12-20 DIAGNOSIS — I10 Essential (primary) hypertension: Secondary | ICD-10-CM

## 2021-12-20 DIAGNOSIS — R0989 Other specified symptoms and signs involving the circulatory and respiratory systems: Secondary | ICD-10-CM

## 2021-12-20 DIAGNOSIS — R739 Hyperglycemia, unspecified: Secondary | ICD-10-CM

## 2021-12-20 DIAGNOSIS — E559 Vitamin D deficiency, unspecified: Secondary | ICD-10-CM

## 2021-12-20 DIAGNOSIS — N401 Enlarged prostate with lower urinary tract symptoms: Secondary | ICD-10-CM

## 2021-12-20 DIAGNOSIS — R3914 Feeling of incomplete bladder emptying: Secondary | ICD-10-CM

## 2021-12-20 LAB — URINALYSIS, ROUTINE W REFLEX MICROSCOPIC
Bilirubin Urine: NEGATIVE
Hgb urine dipstick: NEGATIVE
Ketones, ur: NEGATIVE
Leukocytes,Ua: NEGATIVE
Nitrite: NEGATIVE
RBC / HPF: NONE SEEN (ref 0–?)
Specific Gravity, Urine: 1.025 (ref 1.000–1.030)
Total Protein, Urine: NEGATIVE
Urine Glucose: NEGATIVE
Urobilinogen, UA: 0.2 (ref 0.0–1.0)
pH: 6 (ref 5.0–8.0)

## 2021-12-20 LAB — LIPID PANEL
Cholesterol: 152 mg/dL (ref 0–200)
HDL: 65.3 mg/dL (ref >39.00)
LDL Cholesterol: 75 mg/dL (ref 0–99)
NonHDL: 86.79
Total CHOL/HDL Ratio: 2
Triglycerides: 58 mg/dL (ref 0.0–149.0)
VLDL: 11.6 mg/dL (ref 0.0–40.0)

## 2021-12-20 LAB — HEPATIC FUNCTION PANEL
ALT: 17 U/L (ref 0–53)
AST: 19 U/L (ref 0–37)
Albumin: 4.1 g/dL (ref 3.5–5.2)
Alkaline Phosphatase: 55 U/L (ref 39–117)
Bilirubin, Direct: 0.1 mg/dL (ref 0.0–0.3)
Total Bilirubin: 0.6 mg/dL (ref 0.2–1.2)
Total Protein: 7.3 g/dL (ref 6.0–8.3)

## 2021-12-20 LAB — BASIC METABOLIC PANEL
BUN: 13 mg/dL (ref 6–23)
CO2: 30 mEq/L (ref 19–32)
Calcium: 9.5 mg/dL (ref 8.4–10.5)
Chloride: 104 mEq/L (ref 96–112)
Creatinine, Ser: 1.35 mg/dL (ref 0.40–1.50)
GFR: 50.81 mL/min — ABNORMAL LOW (ref >60.00)
Glucose, Bld: 83 mg/dL (ref 70–99)
Potassium: 4.3 mEq/L (ref 3.5–5.1)
Sodium: 142 mEq/L (ref 135–145)

## 2021-12-20 LAB — HEMOGLOBIN A1C: Hgb A1c MFr Bld: 5.5 % (ref 4.6–6.5)

## 2021-12-20 LAB — VITAMIN D 25 HYDROXY (VIT D DEFICIENCY, FRACTURES): VITD: 50.86 ng/mL (ref 30.00–100.00)

## 2021-12-20 MED ORDER — AMLODIPINE BESYLATE 5 MG PO TABS: 5.0000 mg | ORAL_TABLET | Freq: Every day | ORAL | 3 refills | Status: DC

## 2021-12-20 MED ORDER — TAMSULOSIN HCL 0.4 MG PO CAPS: 0.4000 mg | ORAL_CAPSULE | Freq: Every day | ORAL | 3 refills | Status: DC

## 2021-12-20 NOTE — Patient Instructions (Signed)
Please take all new medication as prescribed - the amlodipine 5 mg per day for BP, and the FLomax 1 per day for the prostate  Please call for Urology referral if your leakage and urine symptoms are not improved  Please continue all other medications as before, and refills have been done if requested.  Please have the pharmacy call with any other refills you may need.  Please continue your efforts at being more active, low cholesterol diet, and weight control.  Please keep your appointments with your specialists as you may have planned  Please go to the LAB at the blood drawing area for the tests to be done  You will be contacted by phone if any changes need to be made immediately.  Otherwise, you will receive a letter about your results with an explanation, but please check with MyChart first.  Please remember to sign up for MyChart if you have not done so, as this will be important to you in the future with finding out test results, communicating by private email, and scheduling acute appointments online when needed.  Please make an Appointment to return in 6 months, or sooner if needed, also with Lab Appointment for testing done 3-5 days before at the [facility] (so this is for TWO appointments - please see the scheduling desk as you leave)   .

## 2021-12-20 NOTE — Progress Notes (Signed)
Patient ID: [name], male   DOB: April 08, 1946, 76 y.o.   MRN: [id number]        Chief Complaint: follow up HTN, BPH, ckd       HPI:  [name] is a 76 y.o. male here with wife, overall doing ok.  Denies urinary symptoms such as dysuria, frequency, urgency, flank pain, hematuria or n/v, fever, chills, but does have several months worsening difficulty getting started urination and dribbling. BP has been mild elevated at home per wife.   Pt denies polydipsia, polyuria, or new focal neuro s/s.   Pt denies fever, wt loss, night sweats, loss of appetite, or other constitutional symptoms  No other new complaints       Wt Readings from Last 3 Encounters:  12/20/21 184 lb (83.5 kg)  09/01/21 191 lb (86.6 kg)  08/03/21 189 lb (85.7 kg)   BP Readings from Last 3 Encounters:  12/20/21 (!) 162/96  09/01/21 140/80  08/03/21 112/68         Past Medical History:  Diagnosis Date   Acute hypoxemic respiratory failure due to COVID-19 53/29/9242   Acute metabolic encephalopathy 68/34/1962   related to COVID-19 infection   Chronic constipation    CKD (chronic kidney disease)    stage III   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 Currently on nifedipine 30 mg daily. Serial BPs: -02/03/13: 118/82.     History of COVID-19 11/19/2019   Hyperbilirubinemia    Hypotension 11/18/2019   Hypoxia 11/21/2019   related to COVID-19 infection   Leukocytosis 10/10/2019   Major depressive disorder 12/30/2013   Personal history of colonic polyps 12/30/2013   Per colonoscopy at [facility] per pt, about sept 2014   PTSD (post-traumatic stress disorder)    saw combat during [place] war   Thrombocytopenia 10/10/2019   Tubular adenoma of colon 2014   Visual hallucinations 03/2020   Vitamin D deficiency 08/07/2019   Past Surgical History:  Procedure  Laterality Date   COLONOSCOPY     MUSCLE RECESSION AND RESECTION Left 12/25/2018   Procedure: INFERIOR OBLIQUE RECESSION LEFT EYE, SUPERIOR OBLIQUE TUCK LEFT EYE;  Surgeon: Gevena Cotton, MD;  Location: [facility];  Service: Ophthalmology;  Laterality: Left;   UPPER GASTROINTESTINAL ENDOSCOPY      reports that he has never smoked. He has never used smokeless tobacco. He reports that he does not drink alcohol and does not use drugs. family history includes Hypertension in an other family member. Allergies  Allergen Reactions   Lisinopril     Angioedema? Patient reports that his tongue and mouth became very swollen, was seen at [facility] for this in 2014.    Current Outpatient Medications on File Prior to Visit  Medication Sig Dispense Refill   acetaminophen (TYLENOL) 325 MG tablet Take 2 tablets (650 mg total) by mouth every 6 (six) hours as needed for mild pain or headache (fever >/= 101).     albuterol (VENTOLIN HFA) 108 (90 Base) MCG/ACT inhaler Inhale 1-2 puffs into the lungs every 4 (four) hours as needed for wheezing or shortness of breath.     Artificial Tear Ointment (DRY EYES OP) Place 1 drop into both eyes daily as needed (dry eyes).     Cholecalciferol (VITAMIN D3) 50 MCG (2000 UT) capsule Take 2,000 Units by mouth daily.     lactulose (CHRONULAC) 10 GM/15ML solution Take 30  mLs (20 g total) by mouth 2 (two) times daily as needed for moderate constipation or severe constipation. 236 mL 0   meloxicam (MOBIC) 15 MG tablet Take 0.5 tablets (7.5 mg total) by mouth daily. 7 tablet 0   Multiple Vitamins-Minerals (ONE-A-DAY MENS 50+) TABS Take 1 tablet by mouth daily.     QUEtiapine (SEROQUEL) 50 MG tablet Take 1 tablet (50 mg total) by mouth at bedtime. 90 tablet 1   vitamin B-12 (CYANOCOBALAMIN) 1000 MCG tablet Take 1,000 mcg by mouth daily.     vitamin C (ASCORBIC ACID) 500 MG tablet Take 500 mg by mouth daily.     No current facility-administered medications on file prior  to visit.        ROS:  All others reviewed and negative.  Objective        PE:  BP (!) 162/96 (BP Location: Left Arm, Patient Position: Sitting, Cuff Size: Large)   Pulse (!) 55   Temp 98.2 F (36.8 C) (Oral)   Ht '5\' 7"'$  (1.702 m)   Wt 184 lb (83.5 kg)   SpO2 98%   BMI 28.82 kg/m                 Constitutional: Pt appears in NAD               HENT: Head: NCAT.                Right Ear: External ear normal.                 Left Ear: External ear normal.                Eyes: . Pupils are equal, round, and reactive to light. Conjunctivae and EOM are normal               Nose: without d/c or deformity               Neck: Neck supple. Gross normal ROM               Cardiovascular: Normal rate and regular rhythm.                 Pulmonary/Chest: Effort normal and breath sounds without rales or wheezing.                Abd:  Soft, NT, ND, + BS, no organomegaly               Neurological: Pt is alert. At baseline orientation, motor grossly intact               Skin: Skin is warm. No rashes, no other new lesions, LE edema - none               Psychiatric: Pt behavior is normal without agitation   Micro: none  Cardiac tracings I have personally interpreted today:  none  Pertinent Radiological findings (summarize): none   Lab Results  Component Value Date   WBC 4.1 09/01/2021   HGB 12.5 (L) 09/01/2021   HCT 38.0 (L) 09/01/2021   PLT 112.0 (L) 09/01/2021   GLUCOSE 83 12/20/2021   CHOL 152 12/20/2021   TRIG 58.0 12/20/2021   HDL 65.30 12/20/2021   LDLCALC 75 12/20/2021   ALT 17 12/20/2021   AST 19 12/20/2021   NA 142 12/20/2021   K 4.3 12/20/2021   CL 104 12/20/2021   CREATININE 1.35 12/20/2021   BUN 13 12/20/2021   CO2  30 12/20/2021   TSH 1.05 06/22/2021   PSA 0.68 06/22/2021   HGBA1C 5.5 12/20/2021   Assessment/Plan:  [name] is a 76 y.o. Black or African American [2] male with  has a past medical history of Acute hypoxemic respiratory failure due to COVID-19  (80/32/1224), Acute metabolic encephalopathy (82/50/0370), Chronic constipation, CKD (chronic kidney disease), Cognitive deficits following cerebrovascular disease (06/08/2020), Essential hypertension (02/17/2010), History of COVID-19 (11/19/2019), Hyperbilirubinemia, Hypotension (11/18/2019), Hypoxia (11/21/2019), Leukocytosis (10/10/2019), Major depressive disorder (12/30/2013), Personal history of colonic polyps (12/30/2013), PTSD (post-traumatic stress disorder), Thrombocytopenia (10/10/2019), Tubular adenoma of colon (2014), Visual hallucinations (03/2020), and Vitamin D deficiency (08/07/2019).  CKD (chronic kidney disease), stage III Lab Results  Component Value Date   CREATININE 1.35 12/20/2021   Stable overall, cont to avoid nephrotoxins   Labile hypertension Persistently mild elevated , for start amldoipine 5 mg qd, cont to f/u BP at home and next visit  Vitamin D deficiency Last vitamin D Lab Results  Component Value Date   VD25OH 50.86 12/20/2021   Stable, cont oral replacement    BPH (benign prostatic hyperplasia) Worsening with mild symptoms, for start flomax 0.4 mg qd,  to f/u any worsening symptoms or concerns  Urinary incontinence, male, stress Minor, but cant r/o infection - for urine cx as well  Followup: Return in about 6 months (around 06/20/2022).  Cathlean Cower, MD 12/22/2021 9:29 PM [facility]

## 2021-12-21 ENCOUNTER — Telehealth: Payer: Self-pay | Admitting: Internal Medicine

## 2021-12-21 LAB — URINE CULTURE: Result:: NO GROWTH

## 2021-12-21 NOTE — Telephone Encounter (Signed)
Patient would like to have someone call him about his blood work from yesterday - he is questioning if a platelet count was done.  Please advise.

## 2021-12-22 ENCOUNTER — Encounter: Payer: Self-pay | Admitting: Internal Medicine

## 2021-12-22 ENCOUNTER — Telehealth: Payer: Self-pay

## 2021-12-22 DIAGNOSIS — N393 Stress incontinence (female) (male): Secondary | ICD-10-CM | POA: Insufficient documentation

## 2021-12-22 DIAGNOSIS — N400 Enlarged prostate without lower urinary tract symptoms: Secondary | ICD-10-CM | POA: Insufficient documentation

## 2021-12-22 NOTE — Assessment & Plan Note (Signed)
Last vitamin D Lab Results  Component Value Date   VD25OH 50.86 12/20/2021   Stable, cont oral replacement  

## 2021-12-22 NOTE — Assessment & Plan Note (Signed)
Minor, but cant r/o infection - for urine cx as well

## 2021-12-22 NOTE — Telephone Encounter (Signed)
I didn't see the reason to check this time, since it has been stable for over 2 yrs.  I dont have an explanation except this is very common, often long term and usually does not require further evaluation or treatment, or even regular blood testing for this.   thanks

## 2021-12-22 NOTE — Telephone Encounter (Signed)
Please advise as patient has seen results and would like to know if a platelet count was done.

## 2021-12-22 NOTE — Assessment & Plan Note (Signed)
Persistently mild elevated , for start amldoipine 5 mg qd, cont to f/u BP at home and next visit

## 2021-12-22 NOTE — Assessment & Plan Note (Signed)
Lab Results  Component Value Date   CREATININE 1.35 12/20/2021   Stable overall, cont to avoid nephrotoxins

## 2021-12-22 NOTE — Telephone Encounter (Signed)
Pt wanted to know the reason why his lab from this week did not have platelets, and why it is not being checked, I let pt know about that is was done in may 09/01/21. Pt wanted to know why is was on that lab, I let pt know the notes Dr. Jenny Reichmann has put in about his lab results being stable and that his low platelets can be from numerous reasons to it. Please advise more if there is a reason for his low platelets

## 2021-12-22 NOTE — Assessment & Plan Note (Signed)
Worsening with mild symptoms, for start flomax 0.4 mg qd,  to f/u any worsening symptoms or concerns

## 2021-12-22 NOTE — Telephone Encounter (Signed)
Not it was not done, and I dont think it needs to be repeated at this time

## 2021-12-26 NOTE — Telephone Encounter (Signed)
Patient message sent °

## 2022-02-06 ENCOUNTER — Ambulatory Visit: Payer: BC Managed Care – PPO | Admitting: Internal Medicine

## 2022-02-09 ENCOUNTER — Telehealth: Payer: Self-pay | Admitting: Internal Medicine

## 2022-02-09 NOTE — Telephone Encounter (Signed)
Patient does not feel like the amlodopine is working for him - his bp is fluctuating too much between low and high - During the day and in the evenings it is running high.  Can this be increased or is there something else that he can take that will not effect his kidneys - please advise.

## 2022-02-13 NOTE — Telephone Encounter (Signed)
Left message for a call back.

## 2022-02-27 ENCOUNTER — Encounter (HOSPITAL_COMMUNITY): Payer: Self-pay

## 2022-02-27 ENCOUNTER — Emergency Department (HOSPITAL_COMMUNITY)
Admission: EM | Admit: 2022-02-27 | Discharge: 2022-02-27 | Disposition: A | Discharge status: Home / Self Care | Payer: BC Managed Care – PPO | Attending: Emergency Medicine | Admitting: Emergency Medicine

## 2022-02-27 ENCOUNTER — Other Ambulatory Visit: Payer: Self-pay

## 2022-02-27 DIAGNOSIS — Z8673 Personal history of transient ischemic attack (TIA), and cerebral infarction without residual deficits: Secondary | ICD-10-CM | POA: Insufficient documentation

## 2022-02-27 DIAGNOSIS — Z79899 Other long term (current) drug therapy: Secondary | ICD-10-CM | POA: Insufficient documentation

## 2022-02-27 DIAGNOSIS — N189 Chronic kidney disease, unspecified: Secondary | ICD-10-CM | POA: Insufficient documentation

## 2022-02-27 DIAGNOSIS — R7401 Elevation of levels of liver transaminase levels: Secondary | ICD-10-CM | POA: Diagnosis not present

## 2022-02-27 DIAGNOSIS — I151 Hypertension secondary to other renal disorders: Secondary | ICD-10-CM | POA: Diagnosis not present

## 2022-02-27 DIAGNOSIS — R42 Dizziness and giddiness: Secondary | ICD-10-CM | POA: Diagnosis present

## 2022-02-27 DIAGNOSIS — I159 Secondary hypertension, unspecified: Secondary | ICD-10-CM

## 2022-02-27 LAB — CBC WITH DIFFERENTIAL/PLATELET
Abs Immature Granulocytes: 0.01 10*3/uL (ref 0.00–0.07)
Basophils Absolute: 0 10*3/uL (ref 0.0–0.1)
Basophils Relative: 1 %
Eosinophils Absolute: 0 10*3/uL (ref 0.0–0.5)
Eosinophils Relative: 1 %
HCT: 44.2 % (ref 39.0–52.0)
Hemoglobin: 14.1 g/dL (ref 13.0–17.0)
Immature Granulocytes: 0 %
Lymphocytes Relative: 35 %
Lymphs Abs: 1 10*3/uL (ref 0.7–4.0)
MCH: 29.6 pg (ref 26.0–34.0)
MCHC: 31.9 g/dL (ref 30.0–36.0)
MCV: 92.9 fL (ref 80.0–100.0)
Monocytes Absolute: 0.3 10*3/uL (ref 0.1–1.0)
Monocytes Relative: 9 %
Neutro Abs: 1.6 10*3/uL — ABNORMAL LOW (ref 1.7–7.7)
Neutrophils Relative %: 54 %
Platelets: 111 10*3/uL — ABNORMAL LOW (ref 150–400)
RBC: 4.76 MIL/uL (ref 4.22–5.81)
RDW: 13.7 % (ref 11.5–15.5)
WBC: 2.9 10*3/uL — ABNORMAL LOW (ref 4.0–10.5)
nRBC: 0 % (ref 0.0–0.2)

## 2022-02-27 LAB — COMPREHENSIVE METABOLIC PANEL
ALT: 16 U/L (ref 0–44)
AST: 66 U/L — ABNORMAL HIGH (ref 15–41)
Albumin: 4.3 g/dL (ref 3.5–5.0)
Alkaline Phosphatase: 60 U/L (ref 38–126)
Anion gap: 12 (ref 5–15)
BUN: 9 mg/dL (ref 8–23)
CO2: 26 mmol/L (ref 22–32)
Calcium: 9.5 mg/dL (ref 8.9–10.3)
Chloride: 103 mmol/L (ref 98–111)
Creatinine, Ser: 1.2 mg/dL (ref 0.61–1.24)
GFR, Estimated: >60 mL/min (ref >60)
Glucose, Bld: 90 mg/dL (ref 70–99)
Potassium: 5.1 mmol/L (ref 3.5–5.1)
Sodium: 141 mmol/L (ref 135–145)
Total Bilirubin: 1.5 mg/dL — ABNORMAL HIGH (ref 0.3–1.2)
Total Protein: 7.5 g/dL (ref 6.5–8.1)

## 2022-02-27 MED ORDER — LACTATED RINGERS IV BOLUS
1000.0000 mL | Freq: Once | INTRAVENOUS | Status: AC
  Administered 2022-02-27: 1000 mL via INTRAVENOUS

## 2022-02-27 MED ORDER — HYDRALAZINE HCL 20 MG/ML IJ SOLN
10.0000 mg | Freq: Once | INTRAMUSCULAR | Status: AC
  Administered 2022-02-27: 10 mg via INTRAVENOUS
  Filled 2022-02-27: qty 1

## 2022-02-27 MED ORDER — AMLODIPINE BESYLATE 5 MG PO TABS: 10.0000 mg | ORAL_TABLET | Freq: Every day | ORAL | 0 refills | Status: DC

## 2022-02-27 NOTE — ED Provider Notes (Signed)
[facility] EMERGENCY DEPARTMENT Provider Note   CSN: [id number] Arrival date & time: 02/27/22  1609     History {Add pertinent medical, surgical, social history, OB history to HPI:1} No chief complaint on file.   [name] is a 76 y.o. male with history of hypertension, CKD, BPH, CVA, PTSD who presents to the emergency department for elevated blood pressure.  The patient states that he was at the dentist office earlier today, and after receiving articaine and nitrous oxide his blood pressure was noted to rise to 217/139, and they wanted him to come to the emergency department to be checked out.  He states that he tried to walk right after, and did feel lightheaded, but this improved after sitting down.  He now states that he feels back to his normal self, would not be here otherwise except for his high blood pressure.  He states that he does check his blood pressure at home, and that it goes up and down periodically, he thinks that yesterday his systolic was 412.  He was previously on medication but has been taken off of antihypertensives due to some intolerance and episodes of hypotension in the past.  The history is provided by the patient, the EMS personnel and medical records.        Home Medications Prior to Admission medications   Medication Sig Start Date End Date Taking? Authorizing Provider  acetaminophen (TYLENOL) 325 MG tablet Take 2 tablets (650 mg total) by mouth every 6 (six) hours as needed for mild pain or headache (fever >/= 101). 12/09/19   Guilford Shi, MD  albuterol (VENTOLIN HFA) 108 (90 Base) MCG/ACT inhaler Inhale 1-2 puffs into the lungs every 4 (four) hours as needed for wheezing or shortness of breath. 12/09/19   Guilford Shi, MD  amLODipine (NORVASC) 5 MG tablet Take 1 tablet (5 mg total) by mouth daily. 12/20/21 12/20/22  Biagio Borg, MD  Artificial Tear Ointment (DRY EYES OP) Place 1 drop into both eyes daily as needed (dry  eyes).    [provider]  Cholecalciferol (VITAMIN D3) 50 MCG (2000 UT) capsule Take 2,000 Units by mouth daily.    [provider]  lactulose (CHRONULAC) 10 GM/15ML solution Take 30 mLs (20 g total) by mouth 2 (two) times daily as needed for moderate constipation or severe constipation. 12/09/19   Guilford Shi, MD  meloxicam (MOBIC) 15 MG tablet Take 0.5 tablets (7.5 mg total) by mouth daily. 06/29/21   Glennon Mac, DO  Multiple Vitamins-Minerals (ONE-A-DAY MENS 50+) TABS Take 1 tablet by mouth daily.    [provider]  QUEtiapine (SEROQUEL) 50 MG tablet Take 1 tablet (50 mg total) by mouth at bedtime. 06/22/21   Biagio Borg, MD  tamsulosin (FLOMAX) 0.4 MG CAPS capsule Take 1 capsule (0.4 mg total) by mouth daily. 12/20/21   Biagio Borg, MD  vitamin B-12 (CYANOCOBALAMIN) 1000 MCG tablet Take 1,000 mcg by mouth daily.    [provider]  vitamin C (ASCORBIC ACID) 500 MG tablet Take 500 mg by mouth daily.    [provider]      Allergies    Lisinopril    Review of Systems   Review of Systems See HPI.   Physical Exam Updated Vital Signs There were no vitals taken for this visit.  Physical Exam Vitals reviewed.  HENT:     Head: Normocephalic and atraumatic.  Neurological:     Mental Status: He is alert.  Physical Exam:  General: No acute distress.  Eyes: Pupils equally round, reactive to light.  Cardiovascular: Regular rate and rhythm per auscultation.  Respiratory: Normal respirations. Lungs clear to auscultation bilaterally.  GI: Soft, nontender.  Skin: well perfused per inspection, no edema.   Mental Status Exam:  Orientation: Alert and oriented to person, place and month, states year is 2032.  Memory: Cooperative, follows commands well. Recent and remote memory normal.  Attention, concentration: Attention span and concentration are normal.  Language: Speech is slow but clear and language is normal.   Cranial  Nerves:   CN 2 (Optic): Visual fields intact to confrontation. CN 3,4,6 (EOM): Pupils equal and reactive to light. Full extraocular eye movement without nystagmus.  CN 5 (Trigeminal): Facial sensation is normal, no weakness of masticatory muscles.  CN 7 (Facial): No facial weakness or asymmetry.  CN 8 (Auditory): Auditory acuity grossly normal.  CN 9,10 (Glossophar): The uvula is midline, the palate elevates symmetrically.  CN 11 (spinal access): Normal sternocleidomastoid and trapezius strength.  CN 12 (Hypoglossal): The tongue is midline. No atrophy or fasciculations.  Motor:  Strength: 5/5 and symmetric in the upper and lower extremities, no pronation or drift. Tone: Tone and muscle bulk are normal in the upper and lower extremities.   Coordination: Slow but intact finger-to-nose bilaterally with slight tremor.  Sensation: Intact to light touch throughout.   ED Results / Procedures / Treatments   Labs (all labs ordered are listed, but only abnormal results are displayed) Labs Reviewed - No data to display  EKG None  Radiology No results found.  Procedures Procedures  {Document cardiac monitor, telemetry assessment procedure when appropriate:1}  Medications Ordered in ED Medications - No data to display  ED Course/ Medical Decision Making/ A&P                           Medical Decision Making Amount and/or Complexity of Data Reviewed Labs: ordered.  Risk Prescription drug management.   MDM [name] is a 76 y.o. male who presents with elevated blood pressure as per above, found to have initial BP of 179/97 here.  Upon initial evaluation physical exam reveals no focal neurologic deficits, patient states that he feels at his baseline at this time.  He states that he would not have come here for his brief episode of lightheadedness that improved with sitting down, he only came here because the dentist was worried about his high blood pressure.  Per chart  review, patient was previously on lisinopril-hydrochlorothiazide but was discontinued due to angioedema, and alternate antihypertensive was stopped in May for some episodes of hypotension and AKI on CKD.  DDX considered included (but not limited to): Hypertensive emergency, hypertensive urgency, asymptomatic hypertension.  Suspect hypertension is secondary to stress related to dental visit this patient does report some nervousness with that, as well as him no longer being on antihypertensive medication. Patient denies any chest pain or shortness of breath, has no hypoxia or tachypnea, no crackles on lung auscultation, do not suspect acute pulmonary edema.  No evidence of ACS in the setting of lack of chest pain or shortness of breath.  No pulse or neurologic deficits on exam or chest or abdominal pain pain to suggest aortic dissection or ruptured AAA and patient is overall well appearing.  Plan: Will obtain EKG, labs to evaluate for end-organ damage though feel unlikely. Will administer IVFs given what sounds like volume depletion orthostasis at dentist and  slightly delayed capillary refill on exam.  Will perform serial reassessment, suspect patient will be safe for discharge with close outpatient follow-up.  Testing Results: EKG on my independent review demonstrates normal sinus rhythm, 68 bpm.  No ST elevations or depressions, no T wave inversions to suggest acute ischemia.  The PR, QRS, and QTc are appropriate.  No evidence of high-grade conduction block or arrhythmia.  Overall reassuring EKG.  Lab results: ***.   Laboratory studies demonstrate ***no evidence of target organ damage (no elevation in creatinine to suggest AKI, no ischemic EKG changes to suggest type II MI, no altered mental status or neurologic deficits to suggest hypertensive encephalopathy, no hypoxia or shortness of breath to suggest pulmonary edema, and patient feels at baseline.   ED Course: Patient re-evaluated ***. Vital signs  demonstrate BP now ***. Patient is ***.  Patient's close family friend at bedside states that he takes amlodipine 5 mg at home currently.  Increased to 10   ***I believe that the patient is safe for discharge home with outpatient followup. Patient was informed of all pertinent physical exam, laboratory, and imaging findings. Patient's suspected etiology of their symptom presentation was discussed with the patient and all questions were answered.  Patient sent home with a prescription for *** and instructed on its proper use. We discussed the importance of following up with their PCP within the next 2-3 days for repeat examination and further management of high blood pressure. I provided thorough ED return precautions. Patient understands and agrees with the plan. Patient discharged home in stable condition.   The plan for this patient was discussed with my attending physician, who voiced agreement and who oversaw evaluation and treatment of this patient.    Note: Estate manager/land agent was used in the creation of this note. Grammatical errors may be present.    {Document critical care time when appropriate:1} {Document review of labs and clinical decision tools ie heart score, Chads2Vasc2 etc:1}  {Document your independent review of radiology images, and any outside records:1} {Document your discussion with family members, caretakers, and with consultants:1} {Document social determinants of health affecting pt's care:1} {Document your decision making why or why not admission, treatments were needed:1} Final Clinical Impression(s) / ED Diagnoses Final diagnoses:  None    Rx / DC Orders ED Discharge Orders     None

## 2022-02-27 NOTE — Discharge Instructions (Addendum)
You were seen in the emergency department for high blood pressure.  While you are here, we performed a physical exam, checked an EKG, and checked labs.  We also treated your high blood pressure.  We saw no signs of a high blood pressure emergency today.  Your blood pressure was high here, so we are sending you home with a prescription for an increased dose of your amlodipine.  Please take 10 mg daily instead of the 5 mg until you see your primary care doctor, unless your blood pressure is lower than 130/80.   Please make an appointment to see your primary doctor soon as possible to discuss your blood pressure regimen.  Please return to emergency department if you have new chest pain, difficulty breathing, passing out, slurred speech, weakness on 1 side your body, or other reasons to think you need emergency care.  We hope you feel better soon.

## 2022-02-27 NOTE — ED Triage Notes (Addendum)
Patient arrived by [facility] From dentist office following simple dental procedure. Patient here due to increasing HTN. Patient took his daily BP meds this am and the dentist office noted his BP climbing following Articaine and nitrous oxide.  BP in office rose to 217/139, blood sugar normal. Patient alert, denies pain. Ems stroke screen negative. Patient requested to go home but office didn't want him driving with BP increasing

## 2022-02-28 ENCOUNTER — Telehealth: Payer: Self-pay

## 2022-02-28 NOTE — Telephone Encounter (Signed)
Transition Care Management Unsuccessful Follow-up Telephone Call  Date of discharge and from where:  02/27/22-[facility]  Attempts:  1st Attempt  Reason for unsuccessful TCM follow-up call:  Unable to leave message

## 2022-03-02 ENCOUNTER — Ambulatory Visit: Payer: BC Managed Care – PPO | Admitting: Internal Medicine

## 2022-03-02 ENCOUNTER — Encounter: Payer: Self-pay | Admitting: Internal Medicine

## 2022-03-02 VITALS — BP 200/100 | HR 59 | Temp 97.6°F | Ht 67.0 in | Wt 185.0 lb

## 2022-03-02 DIAGNOSIS — R001 Bradycardia, unspecified: Secondary | ICD-10-CM

## 2022-03-02 DIAGNOSIS — E559 Vitamin D deficiency, unspecified: Secondary | ICD-10-CM | POA: Diagnosis not present

## 2022-03-02 DIAGNOSIS — R0989 Other specified symptoms and signs involving the circulatory and respiratory systems: Secondary | ICD-10-CM

## 2022-03-02 MED ORDER — HYDRALAZINE HCL 10 MG PO TABS: 10.0000 mg | ORAL_TABLET | Freq: Three times a day (TID) | ORAL | 3 refills | Status: DC

## 2022-03-02 NOTE — Assessment & Plan Note (Signed)
Lability remains an issue with occasional low BP that seems responsive at home to increased oral fluids per wife; pt to continue the amlodipine 10 mg as started at ED, also hydralazine 10 mg tid, f/u BP at home and next visit

## 2022-03-02 NOTE — Progress Notes (Signed)
Patient ID: [name], male   DOB: 14-Feb-1946, 76 y.o.   MRN: [id number]        Chief Complaint: follow up HTN, bradycardia, low vit d       HPI:  [name] is a 76 y.o. male here with wife, recently has had more severe BPs maybe related to increased oral fluids she has been trying to get him to take; Pt denies chest pain, increased sob or doe, wheezing, orthopnea, PND, increased LE swelling, palpitations, dizziness or syncope.  Tolerating 10 mg amlodipine and BP was better yesterday but still mild high per wife.           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 Currently on nifedipine 30 mg daily. Serial BPs: -02/03/13: 118/82.     History of COVID-19 11/19/2019   Hyperbilirubinemia    Hypotension 11/18/2019   Hypoxia 11/21/2019   related to COVID-19 infection   Leukocytosis 10/10/2019   Major depressive disorder 12/30/2013   Personal history of colonic polyps 12/30/2013   Per colonoscopy at [facility] per pt, about sept 2014   PTSD (post-traumatic stress disorder)    saw combat during [place] war   Thrombocytopenia 10/10/2019   Tubular adenoma of colon 2014   Visual hallucinations 03/2020   Vitamin D deficiency 08/07/2019   Past Surgical History:  Procedure Laterality Date   COLONOSCOPY     MUSCLE RECESSION AND RESECTION Left 12/25/2018    Procedure: INFERIOR OBLIQUE RECESSION LEFT EYE, SUPERIOR OBLIQUE TUCK LEFT EYE;  Surgeon: Gevena Cotton, MD;  Location: [facility];  Service: Ophthalmology;  Laterality: Left;   UPPER GASTROINTESTINAL ENDOSCOPY      reports that he has never smoked. He has never used smokeless tobacco. He reports that he does not drink alcohol and does not use drugs. family history includes Hypertension in an other family member. Allergies  Allergen Reactions   Lisinopril     Angioedema? Patient reports that his tongue and mouth became very swollen, was seen at [facility] for this in 2014.    Current Outpatient Medications on File Prior to Visit  Medication Sig Dispense Refill   acetaminophen (TYLENOL) 325 MG tablet Take 2 tablets (650 mg total) by mouth every 6 (six) hours as needed for mild pain or headache (fever >/= 101).     albuterol (VENTOLIN HFA) 108 (90 Base) MCG/ACT inhaler Inhale 1-2 puffs into the lungs every 4 (four) hours as needed for wheezing or shortness of breath.     amLODipine (NORVASC) 5 MG tablet Take 2 tablets (10 mg total) by mouth daily. 90 tablet 0   Artificial Tear Ointment (DRY EYES OP) Place 1 drop into both eyes daily as needed (dry eyes).     Cholecalciferol (VITAMIN D3) 50 MCG (2000 UT) capsule Take 2,000 Units by mouth daily.  lactulose (CHRONULAC) 10 GM/15ML solution Take 30 mLs (20 g total) by mouth 2 (two) times daily as needed for moderate constipation or severe constipation. 236 mL 0   meloxicam (MOBIC) 15 MG tablet Take 0.5 tablets (7.5 mg total) by mouth daily. 7 tablet 0   Multiple Vitamins-Minerals (ONE-A-DAY MENS 50+) TABS Take 1 tablet by mouth daily.     QUEtiapine (SEROQUEL) 50 MG tablet Take 1 tablet (50 mg total) by mouth at bedtime. 90 tablet 1   tamsulosin (FLOMAX) 0.4 MG CAPS capsule Take 1 capsule (0.4 mg total) by mouth daily. 90 capsule 3   vitamin B-12 (CYANOCOBALAMIN) 1000 MCG tablet Take 1,000 mcg by mouth daily.     vitamin C (ASCORBIC  ACID) 500 MG tablet Take 500 mg by mouth daily.     No current facility-administered medications on file prior to visit.        ROS:  All others reviewed and negative.  Objective        PE:  BP (!) 200/100 (BP Location: Right Arm, Patient Position: Sitting, Cuff Size: Large)   Pulse (!) 59   Temp 97.6 F (36.4 C) (Oral)   Ht '5\' 7"'$  (1.702 m)   Wt 185 lb (83.9 kg)   SpO2 97%   BMI 28.98 kg/m                 Constitutional: Pt appears in NAD               HENT: Head: NCAT.                Right Ear: External ear normal.                 Left Ear: External ear normal.                Eyes: . Pupils are equal, round, and reactive to light. Conjunctivae and EOM are normal               Nose: without d/c or deformity               Neck: Neck supple. Gross normal ROM               Cardiovascular: Normal rate and regular rhythm.                 Pulmonary/Chest: Effort normal and breath sounds without rales or wheezing.                Abd:  Soft, NT, ND, + BS, no organomegaly               Neurological: Pt is alert. At baseline orientation, motor grossly intact               Skin: Skin is warm. No rashes, no other new lesions, LE edema - none               Psychiatric: Pt behavior is normal without agitation   Micro: none  Cardiac tracings I have personally interpreted today:  none  Pertinent Radiological findings (summarize): none   Lab Results  Component Value Date   WBC 2.9 (L) 02/27/2022   HGB 14.1 02/27/2022   HCT 44.2 02/27/2022   PLT 111 (L) 02/27/2022   GLUCOSE 90 02/27/2022   CHOL 152 12/20/2021   TRIG 58.0 12/20/2021   HDL 65.30 12/20/2021   LDLCALC 75 12/20/2021   ALT 16 02/27/2022   AST 66 (H) 02/27/2022  NA 141 02/27/2022   K 5.1 02/27/2022   CL 103 02/27/2022   CREATININE 1.20 02/27/2022   BUN 9 02/27/2022   CO2 26 02/27/2022   TSH 1.05 06/22/2021   PSA 0.68 06/22/2021   HGBA1C 5.5 12/20/2021   Assessment/Plan:  [name] is a 76 y.o. Black or  African American [2] male with  has a past medical history of Acute hypoxemic respiratory failure due to COVID-19 (62/83/1517), Acute metabolic encephalopathy (61/60/7371), Chronic constipation, CKD (chronic kidney disease), Cognitive deficits following cerebrovascular disease (06/08/2020), Essential hypertension (02/17/2010), History of COVID-19 (11/19/2019), Hyperbilirubinemia, Hypotension (11/18/2019), Hypoxia (11/21/2019), Leukocytosis (10/10/2019), Major depressive disorder (12/30/2013), Personal history of colonic polyps (12/30/2013), PTSD (post-traumatic stress disorder), Thrombocytopenia (10/10/2019), Tubular adenoma of colon (2014), Visual hallucinations (03/2020), and Vitamin D deficiency (08/07/2019).  Bradycardia Chronic stable, asympt, to follow but avoid BB treatment  Labile hypertension Lability remains an issue with occasional low BP that seems responsive at home to increased oral fluids per wife; pt to continue the amlodipine 10 mg as started at ED, also hydralazine 10 mg tid, f/u BP at home and next visit  Vitamin D deficiency Last vitamin D Lab Results  Component Value Date   VD25OH 50.86 12/20/2021   Stable, cont oral replacement  Followup: Return in about 3 months (around 06/02/2022).  Cathlean Cower, MD 03/02/2022 1:09 PM [facility]

## 2022-03-02 NOTE — Assessment & Plan Note (Signed)
Last vitamin D Lab Results  Component Value Date   VD25OH 50.86 12/20/2021   Stable, cont oral replacement

## 2022-03-02 NOTE — Patient Instructions (Addendum)
Please have your Shingrix (shingles) shots done at your local pharmacy, as well as the flu shot if you change your mind  Please take all new medication as prescribed - the hydralazine 10 mg three times per day, but ok to hold for SBP less than 130  Please continue all other medications as before, and refills have been done if requested.  Please have the pharmacy call with any other refills you may need.  Please continue your efforts at being more active, low cholesterol diet, and weight control.  You are otherwise up to date with prevention measures today.  Please keep your appointments with your specialists as you may have planned  Please make an Appointment to return in 3 months, or sooner if needed

## 2022-03-02 NOTE — Assessment & Plan Note (Signed)
Chronic stable, asympt, to follow but avoid BB treatment

## 2022-03-27 ENCOUNTER — Telehealth: Payer: Self-pay | Admitting: Internal Medicine

## 2022-03-27 NOTE — Telephone Encounter (Signed)
Patient does not feel like his amlodopine and hydralazine are working.  He is having blood pressure issues - patient thinks these need to be increased.

## 2022-03-27 NOTE — Telephone Encounter (Signed)
BP Readings from Last 3 Encounters:  03/02/22 (!) 200/100  02/27/22 (!) 176/101  12/20/21 (!) 162/96   Please clarify with wife if pt is taking all medications or has he (or she) caused him to miss medication dosing for some reason?  If I recall, I think wife has sometimes had him hold medications for her perceived reasons  Pt has cognitive difficulty, and comprehension and compliance is somewhat may be at play in this situation;  please ask how his BP is doing at home to think that it is high - what are numbers if he has them

## 2022-03-27 NOTE — Telephone Encounter (Signed)
Patient states that the Amlodipine and Hydralazine are not effective in lowering his blood pressure and would like to either increase the dosage or send in an alternative. Please advise.

## 2022-03-28 MED ORDER — HYDRALAZINE HCL 25 MG PO TABS: 25.0000 mg | ORAL_TABLET | Freq: Three times a day (TID) | ORAL | 11 refills | Status: DC

## 2022-03-28 NOTE — Telephone Encounter (Signed)
Patients wife called back. She said he hasn't missed any doses, she said she is making sure he is taking it. She said the Hydralazine works sometimes but not all the time.  This morning was 145/95 first thing and then 143-90 and then 141/96 and then 171/105   Just now: 161/101

## 2022-03-28 NOTE — Telephone Encounter (Signed)
At least the BP is improved  Ok to increase the hydralazine to 25 mg three times per day (I will send new prescription)

## 2022-03-28 NOTE — Telephone Encounter (Signed)
Patient informed of dosage increase sent to pharmacy

## 2022-04-18 NOTE — Telephone Encounter (Signed)
Patient is unable to tell me his low bp readings but states he is taking the '10mg'$  tabs in which he thinks it works off and on

## 2022-04-18 NOTE — Telephone Encounter (Signed)
Please advise 

## 2022-04-18 NOTE — Telephone Encounter (Signed)
Please only need to check BP 1-2 times daily  He may be overtreated if the BP is occasions too low  Please clarify what they call low BP; and is he actually taking the 10 mg amlodipine (2 of the 5 mg tabs)???

## 2022-04-18 NOTE — Telephone Encounter (Signed)
PT and PT's emergency contact call today following up on issues with this medication. PT is still experiencing sporadic BP readings. PT's friend states PT is still taking medications as advised by Dr.John. States that the hydrALAZINE (APRESOLINE) 25 MG tablet when it works sometimes takes a lot longer to take effect. She also states that the amLODipine (NORVASC) 5 MG tablet is not working at all.  Both of them have expressed that they are having to monitor these readings hour to hour. They stated that levels could be fine for 4-5 hours with no real change to what PT is doing and then upon the 6th hour the BP could be extremely low.   CB: [phone]

## 2022-04-18 NOTE — Telephone Encounter (Signed)
Ok to continue medications as is for now, but let us know if has further low Bps.  We cannot give more medication to help the higher Bps as this would make the lower BP even lower

## 2022-04-24 ENCOUNTER — Telehealth: Payer: Self-pay | Admitting: Internal Medicine

## 2022-04-24 NOTE — Telephone Encounter (Signed)
Patient called stating that they would like a callback from Dr. Gwynn Burly nurse today. Patient is stating that the blood pressure medication is not working and would like to talk about alternative medications. Best callback number is [phone].

## 2022-04-25 NOTE — Telephone Encounter (Signed)
Left message for a call back to schedule ROV regarding bp concerns

## 2022-04-25 NOTE — Telephone Encounter (Signed)
Please call patient back.  - Please call [phone]

## 2022-04-25 NOTE — Telephone Encounter (Signed)
Spoke with patients spouse who states that the Hydralazine is not bringing bp down to normal and states that he is not taking it 3 times a day as directed because it causes his bp to be too low. He has a normal reading once every 3 days.

## 2022-04-25 NOTE — Telephone Encounter (Signed)
Needs ROV 

## 2022-04-25 NOTE — Telephone Encounter (Signed)
Left message for a call back regarding bp issues

## 2022-05-04 ENCOUNTER — Ambulatory Visit: Payer: BC Managed Care – PPO | Admitting: Internal Medicine

## 2022-05-04 VITALS — BP 162/90 | HR 60 | Temp 98.2°F | Ht 67.0 in | Wt 186.0 lb

## 2022-05-04 DIAGNOSIS — R0989 Other specified symptoms and signs involving the circulatory and respiratory systems: Secondary | ICD-10-CM | POA: Diagnosis not present

## 2022-05-04 DIAGNOSIS — E559 Vitamin D deficiency, unspecified: Secondary | ICD-10-CM | POA: Diagnosis not present

## 2022-05-04 DIAGNOSIS — R739 Hyperglycemia, unspecified: Secondary | ICD-10-CM | POA: Diagnosis not present

## 2022-05-04 DIAGNOSIS — N1831 Chronic kidney disease, stage 3a: Secondary | ICD-10-CM | POA: Diagnosis not present

## 2022-05-04 DIAGNOSIS — E538 Deficiency of other specified B group vitamins: Secondary | ICD-10-CM | POA: Diagnosis not present

## 2022-05-04 DIAGNOSIS — D696 Thrombocytopenia, unspecified: Secondary | ICD-10-CM | POA: Diagnosis not present

## 2022-05-04 LAB — URINALYSIS, ROUTINE W REFLEX MICROSCOPIC
Bilirubin Urine: NEGATIVE
Hgb urine dipstick: NEGATIVE
Ketones, ur: NEGATIVE
Leukocytes,Ua: NEGATIVE
Nitrite: NEGATIVE
RBC / HPF: NONE SEEN (ref 0–?)
Specific Gravity, Urine: <=1.005 — AB (ref 1.000–1.030)
Total Protein, Urine: NEGATIVE
Urine Glucose: NEGATIVE
Urobilinogen, UA: 0.2 (ref 0.0–1.0)
WBC, UA: NONE SEEN (ref 0–?)
pH: 6 (ref 5.0–8.0)

## 2022-05-04 LAB — BASIC METABOLIC PANEL
BUN: 12 mg/dL (ref 6–23)
CO2: 30 mEq/L (ref 19–32)
Calcium: 9.6 mg/dL (ref 8.4–10.5)
Chloride: 103 mEq/L (ref 96–112)
Creatinine, Ser: 1.26 mg/dL (ref 0.40–1.50)
GFR: 55.05 mL/min — ABNORMAL LOW (ref >60.00)
Glucose, Bld: 91 mg/dL (ref 70–99)
Potassium: 4.2 mEq/L (ref 3.5–5.1)
Sodium: 141 mEq/L (ref 135–145)

## 2022-05-04 LAB — LIPID PANEL
Cholesterol: 177 mg/dL (ref 0–200)
HDL: 67.9 mg/dL (ref >39.00)
LDL Cholesterol: 95 mg/dL (ref 0–99)
NonHDL: 109.15
Total CHOL/HDL Ratio: 3
Triglycerides: 72 mg/dL (ref 0.0–149.0)
VLDL: 14.4 mg/dL (ref 0.0–40.0)

## 2022-05-04 LAB — HEPATIC FUNCTION PANEL
ALT: 15 U/L (ref 0–53)
AST: 20 U/L (ref 0–37)
Albumin: 4.3 g/dL (ref 3.5–5.2)
Alkaline Phosphatase: 61 U/L (ref 39–117)
Bilirubin, Direct: 0.1 mg/dL (ref 0.0–0.3)
Total Bilirubin: 0.5 mg/dL (ref 0.2–1.2)
Total Protein: 7.3 g/dL (ref 6.0–8.3)

## 2022-05-04 LAB — CBC WITH DIFFERENTIAL/PLATELET
Basophils Absolute: 0 10*3/uL (ref 0.0–0.1)
Basophils Relative: 0.8 % (ref 0.0–3.0)
Eosinophils Absolute: 0 10*3/uL (ref 0.0–0.7)
Eosinophils Relative: 0.4 % (ref 0.0–5.0)
HCT: 39.3 % (ref 39.0–52.0)
Hemoglobin: 13 g/dL (ref 13.0–17.0)
Lymphocytes Relative: 37 % (ref 12.0–46.0)
Lymphs Abs: 1.3 10*3/uL (ref 0.7–4.0)
MCHC: 33 g/dL (ref 30.0–36.0)
MCV: 89.5 fl (ref 78.0–100.0)
Monocytes Absolute: 0.4 10*3/uL (ref 0.1–1.0)
Monocytes Relative: 10.2 % (ref 3.0–12.0)
Neutro Abs: 1.8 10*3/uL (ref 1.4–7.7)
Neutrophils Relative %: 51.6 % (ref 43.0–77.0)
Platelets: 118 10*3/uL — ABNORMAL LOW (ref 150.0–400.0)
RBC: 4.4 Mil/uL (ref 4.22–5.81)
RDW: 14.2 % (ref 11.5–15.5)
WBC: 3.5 10*3/uL — ABNORMAL LOW (ref 4.0–10.5)

## 2022-05-04 LAB — VITAMIN D 25 HYDROXY (VIT D DEFICIENCY, FRACTURES): VITD: 37.89 ng/mL (ref 30.00–100.00)

## 2022-05-04 LAB — VITAMIN B12: Vitamin B-12: 599 pg/mL (ref 211–911)

## 2022-05-04 LAB — HEMOGLOBIN A1C: Hgb A1c MFr Bld: 5.4 % (ref 4.6–6.5)

## 2022-05-04 LAB — TSH: TSH: 0.56 u[IU]/mL (ref 0.35–5.50)

## 2022-05-04 MED ORDER — AMLODIPINE BESYLATE 10 MG PO TABS: 10.0000 mg | ORAL_TABLET | Freq: Every day | ORAL | 3 refills | Status: DC

## 2022-05-04 MED ORDER — HYDROCHLOROTHIAZIDE 12.5 MG PO CAPS: 12.5000 mg | ORAL_CAPSULE | Freq: Every day | ORAL | 3 refills | Status: DC

## 2022-05-04 NOTE — Progress Notes (Signed)
Patient ID: [name], male   DOB: 1945/08/21, 77 y.o.   MRN: [id number]        Chief Complaint: follow up HTN, low platelets, ckd3a, low vit d       HPI:  [name] is a 77 y.o. male here with wife, overall doing ok;  Pt denies chest pain, increased sob or doe, wheezing, orthopnea, PND, increased LE swelling, palpitations, dizziness or syncope.   Pt denies polydipsia, polyuria, or new focal neuro s/s.    Pt denies fever, wt loss, night sweats, loss of appetite, or other constitutional symptoms  BP has been mildly increased at home on regular basis last month despite good med compliance..  No overt bleeding or bruising.     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 Currently on nifedipine 30 mg daily. Serial BPs: -02/03/13: 118/82.     History of COVID-19 11/19/2019   Hyperbilirubinemia    Hypotension 11/18/2019   Hypoxia 11/21/2019   related to COVID-19 infection   Leukocytosis 10/10/2019   Major depressive disorder 12/30/2013   Personal history of colonic polyps 12/30/2013   Per colonoscopy at [facility] per pt, about sept 2014   PTSD (post-traumatic stress disorder)    saw combat during [place] war   Thrombocytopenia 10/10/2019   Tubular adenoma of colon 2014   Visual hallucinations 03/2020   Vitamin D deficiency 08/07/2019   Past Surgical  History:  Procedure Laterality Date   COLONOSCOPY     MUSCLE RECESSION AND RESECTION Left 12/25/2018   Procedure: INFERIOR OBLIQUE RECESSION LEFT EYE, SUPERIOR OBLIQUE TUCK LEFT EYE;  Surgeon: Gevena Cotton, MD;  Location: [facility];  Service: Ophthalmology;  Laterality: Left;   UPPER GASTROINTESTINAL ENDOSCOPY      reports that he has never smoked. He has never used smokeless tobacco. He reports that he does not drink alcohol and does not use drugs. family history includes Hypertension in an other family member. Allergies  Allergen Reactions   Lisinopril     Angioedema? Patient reports that his tongue and mouth became very swollen, was seen at [facility] for this in 2014.    Current Outpatient Medications on File Prior to Visit  Medication Sig Dispense Refill   acetaminophen (TYLENOL) 325 MG tablet Take 2 tablets (650 mg total) by mouth every 6 (six) hours as needed for mild pain or headache (fever >/= 101).     albuterol (VENTOLIN HFA) 108 (90 Base) MCG/ACT inhaler Inhale 1-2 puffs into the lungs every 4 (four) hours as needed for wheezing or shortness of breath.     Artificial Tear Ointment (DRY EYES OP) Place 1 drop into both eyes daily as needed (dry eyes).     Cholecalciferol (VITAMIN D3) 50 MCG (2000 UT) capsule Take 2,000 Units by mouth daily.     hydrALAZINE (APRESOLINE)  25 MG tablet Take 1 tablet (25 mg total) by mouth 3 (three) times daily. 90 tablet 11   lactulose (CHRONULAC) 10 GM/15ML solution Take 30 mLs (20 g total) by mouth 2 (two) times daily as needed for moderate constipation or severe constipation. 236 mL 0   meloxicam (MOBIC) 15 MG tablet Take 0.5 tablets (7.5 mg total) by mouth daily. 7 tablet 0   Multiple Vitamins-Minerals (ONE-A-DAY MENS 50+) TABS Take 1 tablet by mouth daily.     QUEtiapine (SEROQUEL) 50 MG tablet Take 1 tablet (50 mg total) by mouth at bedtime. 90 tablet 1   tamsulosin (FLOMAX) 0.4 MG CAPS capsule Take 1 capsule (0.4 mg total) by mouth  daily. 90 capsule 3   vitamin B-12 (CYANOCOBALAMIN) 1000 MCG tablet Take 1,000 mcg by mouth daily.     vitamin C (ASCORBIC ACID) 500 MG tablet Take 500 mg by mouth daily.     No current facility-administered medications on file prior to visit.        ROS:  All others reviewed and negative.  Objective        PE:  BP (!) 162/90 (BP Location: Left Arm, Patient Position: Sitting, Cuff Size: Large)   Pulse 60   Temp 98.2 F (36.8 C) (Oral)   Ht '5\' 7"'$  (1.702 m)   Wt 186 lb (84.4 kg)   SpO2 94%   BMI 29.13 kg/m                 Constitutional: Pt appears in NAD               HENT: Head: NCAT.                Right Ear: External ear normal.                 Left Ear: External ear normal.                Eyes: . Pupils are equal, round, and reactive to light. Conjunctivae and EOM are normal               Nose: without d/c or deformity               Neck: Neck supple. Gross normal ROM               Cardiovascular: Normal rate and regular rhythm.                 Pulmonary/Chest: Effort normal and breath sounds without rales or wheezing.                Abd:  Soft, NT, ND, + BS, no organomegaly               Neurological: Pt is alert. At baseline orientation, motor grossly intact               Skin: Skin is warm. No rashes, no other new lesions, LE edema - none               Psychiatric: Pt behavior is normal without agitation   Micro: none  Cardiac tracings I have personally interpreted today:  none  Pertinent Radiological findings (summarize): none   Lab Results  Component Value Date   WBC 3.5 (L) 05/04/2022   HGB 13.0 05/04/2022   HCT 39.3 05/04/2022   PLT 118.0 (L) 05/04/2022   GLUCOSE 91 05/04/2022   CHOL 177 05/04/2022   TRIG 72.0 05/04/2022   HDL  67.90 05/04/2022   LDLCALC 95 05/04/2022   ALT 15 05/04/2022   AST 20 05/04/2022   NA 141 05/04/2022   K 4.2 05/04/2022   CL 103 05/04/2022   CREATININE 1.26 05/04/2022   BUN 12 05/04/2022   CO2 30 05/04/2022   TSH 0.56  05/04/2022   PSA 0.68 06/22/2021   HGBA1C 5.4 05/04/2022   Assessment/Plan:  [name] is a 77 y.o. Black or African American [2] male with  has a past medical history of Acute hypoxemic respiratory failure due to COVID-19 (08/25/209), Acute metabolic encephalopathy (17/35/6701), Chronic constipation, CKD (chronic kidney disease), Cognitive deficits following cerebrovascular disease (06/08/2020), Essential hypertension (02/17/2010), History of COVID-19 (11/19/2019), Hyperbilirubinemia, Hypotension (11/18/2019), Hypoxia (11/21/2019), Leukocytosis (10/10/2019), Major depressive disorder (12/30/2013), Personal history of colonic polyps (12/30/2013), PTSD (post-traumatic stress disorder), Thrombocytopenia (10/10/2019), Tubular adenoma of colon (2014), Visual hallucinations (03/2020), and Vitamin D deficiency (08/07/2019).  CKD (chronic kidney disease), stage III Lab Results  Component Value Date   CREATININE 1.26 05/04/2022   Stable overall, cont to avoid nephrotoxins   Labile hypertension BP Readings from Last 3 Encounters:  05/04/22 (!) 162/90  03/02/22 (!) 200/100  02/27/22 (!) 176/101   uncontrolled, pt to continue medical treatment norvasc 10 qd, hydralazine 25 tid, and add hct 12.5 qd as wife is wary of med changes for some reason and somewhat resistant , for some reason believes hydral 50 tid would be too much   Thrombocytopenia Lab Results  Component Value Date   PLT 118.0 (L) 05/04/2022  ,  Stable overall, cont to follow  Vitamin D deficiency Last vitamin D Lab Results  Component Value Date   VD25OH 37.89 05/04/2022   Low, to start oral replacement  Followup: Return in about 26 days (around 05/30/2022).  Cathlean Cower, MD 05/06/2022 4:11 PM [facility]

## 2022-05-04 NOTE — Patient Instructions (Addendum)
Please take all new medication as prescribed  - the HCT 12.5 mg per day for better BP   Please continue all other medications as before, including the amlodipine 10 mg, and hydralazine 25 mg three times per day  Please have the pharmacy call with any other refills you may need.  Please continue your efforts at being more active, low cholesterol diet, and weight control.  Please keep your appointments with your specialists as you may have planned  Please go to the LAB at the blood drawing area for the tests to be done  You will be contacted by phone if any changes need to be made immediately.  Otherwise, you will receive a letter about your results with an explanation, but please check with MyChart first.  Please remember to sign up for MyChart if you have not done so, as this will be important to you in the future with finding out test results, communicating by private email, and scheduling acute appointments online when needed.  Please make an Appointment to return in Feb 13, or sooner if needed

## 2022-05-06 ENCOUNTER — Encounter: Payer: Self-pay | Admitting: Internal Medicine

## 2022-05-06 NOTE — Assessment & Plan Note (Signed)
Lab Results  Component Value Date   PLT 118.0 (L) 05/04/2022  ,  Stable overall, cont to follow

## 2022-05-06 NOTE — Assessment & Plan Note (Signed)
Last vitamin D Lab Results  Component Value Date   VD25OH 37.89 05/04/2022   Low, to start oral replacement

## 2022-05-06 NOTE — Assessment & Plan Note (Signed)
Lab Results  Component Value Date   CREATININE 1.26 05/04/2022   Stable overall, cont to avoid nephrotoxins 

## 2022-05-06 NOTE — Assessment & Plan Note (Signed)
BP Readings from Last 3 Encounters:  05/04/22 (!) 162/90  03/02/22 (!) 200/100  02/27/22 (!) 176/101   uncontrolled, pt to continue medical treatment norvasc 10 qd, hydralazine 25 tid, and add hct 12.5 qd as wife is wary of med changes for some reason and somewhat resistant , for some reason believes hydral 50 tid would be too much

## 2022-05-30 ENCOUNTER — Other Ambulatory Visit: Payer: Self-pay | Admitting: Internal Medicine

## 2022-05-30 ENCOUNTER — Ambulatory Visit: Payer: BC Managed Care – PPO | Admitting: Internal Medicine

## 2022-05-30 VITALS — BP 132/78 | HR 62 | Temp 97.7°F | Ht 67.0 in | Wt 174.0 lb

## 2022-05-30 DIAGNOSIS — R0989 Other specified symptoms and signs involving the circulatory and respiratory systems: Secondary | ICD-10-CM

## 2022-05-30 DIAGNOSIS — N1831 Chronic kidney disease, stage 3a: Secondary | ICD-10-CM

## 2022-05-30 DIAGNOSIS — N401 Enlarged prostate with lower urinary tract symptoms: Secondary | ICD-10-CM | POA: Diagnosis not present

## 2022-05-30 DIAGNOSIS — R350 Frequency of micturition: Secondary | ICD-10-CM | POA: Diagnosis not present

## 2022-05-30 LAB — URINALYSIS, ROUTINE W REFLEX MICROSCOPIC
Bilirubin Urine: NEGATIVE
Hgb urine dipstick: NEGATIVE
Ketones, ur: NEGATIVE
Nitrite: NEGATIVE
RBC / HPF: NONE SEEN (ref 0–?)
Specific Gravity, Urine: 1.01 (ref 1.000–1.030)
Total Protein, Urine: NEGATIVE
Urine Glucose: NEGATIVE
Urobilinogen, UA: 0.2 (ref 0.0–1.0)
pH: 6 (ref 5.0–8.0)

## 2022-05-30 MED ORDER — CIPROFLOXACIN HCL 500 MG PO TABS: 500.0000 mg | ORAL_TABLET | Freq: Two times a day (BID) | ORAL | 0 refills | Status: AC

## 2022-05-30 MED ORDER — TRIAMTERENE-HCTZ 37.5-25 MG PO CAPS: 1.0000 | ORAL_CAPSULE | Freq: Every day | ORAL | 3 refills | Status: DC

## 2022-05-30 NOTE — Patient Instructions (Addendum)
Ok to stop the hct, hydralazine and amlodipine as you have  Please take all new medication as prescribed - the higher strength fluid pill  Please continue all other medications as before, and refills have been done if requested.  Please have the pharmacy call with any other refills you may need.  Please continue your efforts at being more active, low cholesterol diet, and weight control.  Please keep your appointments with your specialists as you may have planned  You will be contacted regarding the referral for: urology  Please go to the LAB at the blood drawing area for the tests to be done - just the urine testing today  Please make an Appointment to return in Jun 20 2022, or sooner if needed

## 2022-05-30 NOTE — Progress Notes (Unsigned)
Patient ID: [name], male   DOB: [birth year], 77 y.o.   MRN: [id number]        Chief Complaint: follow up htn, urinary frequency, ckd 3a, bph       HPI:  [name] is a 77 y.o. male here with wife with 3 -4 days onset increased urinary frequency - Denies urinary symptoms such as dysuria, urgency, flank pain, hematuria or n/v, fever, chills.  Pt denies chest pain, increased sob or doe, wheezing, orthopnea, PND, increased LE swelling, palpitations, dizziness or syncope.   Pt denies polydipsia, polyuria, or new focal neuro s/s.    Pt denies fever, wt loss, night sweats, loss of appetite, or other constitutional symptoms   Wife controls his med use and will freuently not give medication as prescribed, and as most recently stopped his amlodipine 10 mg, and only gives him occasional hydralazine, checking his BP at home multiple times per day for many months.   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 Currently on nifedipine 30 mg daily. Serial BPs: -02/03/13: 118/82.     History of COVID-19 11/19/2019   Hyperbilirubinemia    Hypotension 11/18/2019   Hypoxia 11/21/2019   related to COVID-19 infection   Leukocytosis 10/10/2019   Major depressive disorder 12/30/2013   Personal history of colonic polyps 12/30/2013   Per colonoscopy at [facility] per pt, about sept 2014    PTSD (post-traumatic stress disorder)    saw combat during [place] war   Thrombocytopenia 10/10/2019   Tubular adenoma of colon 2014   Visual hallucinations 03/2020   Vitamin D deficiency 08/07/2019   Past Surgical History:  Procedure Laterality Date   COLONOSCOPY     MUSCLE RECESSION AND RESECTION Left 12/25/2018   Procedure: INFERIOR OBLIQUE RECESSION LEFT EYE, SUPERIOR OBLIQUE TUCK LEFT EYE;  Surgeon: Gevena Cotton, MD;  Location: [facility];  Service: Ophthalmology;  Laterality: Left;   UPPER GASTROINTESTINAL ENDOSCOPY      reports that he has never smoked. He has never used smokeless tobacco. He reports that he does not drink alcohol and does not use drugs. family history includes Hypertension in an other family member. Allergies  Allergen Reactions   Lisinopril     Angioedema? Patient reports that his tongue and mouth became very swollen, was seen at [facility] for this in 2014.    Current Outpatient Medications on File Prior to Visit  Medication Sig Dispense Refill   acetaminophen (TYLENOL) 325 MG tablet Take 2 tablets (650 mg total) by mouth every 6 (six) hours as needed for mild pain or headache (fever >/= 101).     albuterol (VENTOLIN HFA) 108 (90 Base) MCG/ACT inhaler Inhale 1-2 puffs into the lungs every 4 (four) hours as needed for wheezing or shortness of breath.     Artificial Tear Ointment (DRY EYES OP)  Place 1 drop into both eyes daily as needed (dry eyes).     Cholecalciferol (VITAMIN D3) 50 MCG (2000 UT) capsule Take 2,000 Units by mouth daily.     lactulose (CHRONULAC) 10 GM/15ML solution Take 30 mLs (20 g total) by mouth 2 (two) times daily as needed for moderate constipation or severe constipation. 236 mL 0   meloxicam (MOBIC) 15 MG tablet Take 0.5 tablets (7.5 mg total) by mouth daily. 7 tablet 0   Multiple Vitamins-Minerals (ONE-A-DAY MENS 50+) TABS Take 1 tablet by mouth daily.     QUEtiapine (SEROQUEL) 50 MG tablet Take 1 tablet (50 mg total) by  mouth at bedtime. 90 tablet 1   tamsulosin (FLOMAX) 0.4 MG CAPS capsule Take 1 capsule (0.4 mg total) by mouth daily. 90 capsule 3   vitamin B-12 (CYANOCOBALAMIN) 1000 MCG tablet Take 1,000 mcg by mouth daily.     vitamin C (ASCORBIC ACID) 500 MG tablet Take 500 mg by mouth daily.     No current facility-administered medications on file prior to visit.        ROS:  All others reviewed and negative.  Objective        PE:  BP 132/78 (BP Location: Left Arm, Patient Position: Sitting, Cuff Size: Large)   Pulse 62   Temp 97.7 F (36.5 C) (Oral)   Ht 5' 7"$  (1.702 m)   Wt 174 lb (78.9 kg)   SpO2 94%   BMI 27.25 kg/m                 Constitutional: Pt appears in NAD               HENT: Head: NCAT.                Right Ear: External ear normal.                 Left Ear: External ear normal.                Eyes: . Pupils are equal, round, and reactive to light. Conjunctivae and EOM are normal               Nose: without d/c or deformity               Neck: Neck supple. Gross normal ROM               Cardiovascular: Normal rate and regular rhythm.                 Pulmonary/Chest: Effort normal and breath sounds without rales or wheezing.                Abd:  Soft, NT, ND, + BS, no organomegaly               Neurological: Pt is alert. At baseline orientation, motor grossly intact               Skin: Skin is warm. No rashes, no other new lesions, LE edema - none               Psychiatric: Pt behavior is normal without agitation   Micro: none  Cardiac tracings I have personally interpreted today:  none  Pertinent Radiological findings (summarize): none   Lab Results  Component Value Date   WBC 3.5 (L) 05/04/2022   HGB 13.0 05/04/2022   HCT 39.3 05/04/2022   PLT 118.0 (L) 05/04/2022   GLUCOSE 91 05/04/2022  CHOL 177 05/04/2022   TRIG 72.0 05/04/2022   HDL 67.90 05/04/2022   LDLCALC 95 05/04/2022   ALT 15 05/04/2022   AST 20 05/04/2022   NA 141 05/04/2022   K 4.2 05/04/2022    CL 103 05/04/2022   CREATININE 1.26 05/04/2022   BUN 12 05/04/2022   CO2 30 05/04/2022   TSH 0.56 05/04/2022   PSA 0.68 06/22/2021   HGBA1C 5.4 05/04/2022   Assessment/Plan:  [name] is a 77 y.o. Black or African American [2] male with  has a past medical history of Acute hypoxemic respiratory failure due to COVID-19 (123456), Acute metabolic encephalopathy (A999333), Chronic constipation, CKD (chronic kidney disease), Cognitive deficits following cerebrovascular disease (06/08/2020), Essential hypertension (02/17/2010), History of COVID-19 (11/19/2019), Hyperbilirubinemia, Hypotension (11/18/2019), Hypoxia (11/21/2019), Leukocytosis (10/10/2019), Major depressive disorder (12/30/2013), Personal history of colonic polyps (12/30/2013), PTSD (post-traumatic stress disorder), Thrombocytopenia (10/10/2019), Tubular adenoma of colon (2014), Visual hallucinations (03/2020), and Vitamin D deficiency (08/07/2019).  BPH (benign prostatic hyperplasia) Also for urology referral  CKD (chronic kidney disease), stage III Lab Results  Component Value Date   CREATININE 1.26 05/04/2022   Stable overall, cont to avoid nephrotoxins  Labile hypertension Wife not giving his meds as prescribed and appears to be impeding his care due to her own misgivings about his tx; will d/c amlodipine and hydralazine as she clearly will no longer approve of this, and try change hct to dyazide - 1 qd.  Encouraged wife to NOT check BP so oftern as home, once per day is adequate  Urinary frequency Can't r/o uti - for urine culture, cipro 500 bid x 10 days, f/u urology referral  Followup: Return in about 3 weeks (around 06/20/2022).  Cathlean Cower, MD 05/31/2022 7:54 PM [facility]

## 2022-05-31 ENCOUNTER — Encounter: Payer: Self-pay | Admitting: Internal Medicine

## 2022-05-31 ENCOUNTER — Telehealth: Payer: Self-pay | Admitting: Internal Medicine

## 2022-05-31 DIAGNOSIS — R350 Frequency of micturition: Secondary | ICD-10-CM | POA: Insufficient documentation

## 2022-05-31 NOTE — Telephone Encounter (Signed)
Patient wants you to call his wife with test results - Please call [phone]

## 2022-05-31 NOTE — Assessment & Plan Note (Signed)
Can't r/o uti - for urine culture, cipro 500 bid x 10 days, f/u urology referral

## 2022-05-31 NOTE — Telephone Encounter (Signed)
Documentation in result note from provider

## 2022-05-31 NOTE — Assessment & Plan Note (Signed)
Wife not giving his meds as prescribed and appears to be impeding his care due to her own misgivings about his tx; will d/c amlodipine and hydralazine as she clearly will no longer approve of this, and try change hct to dyazide - 1 qd.  Encouraged wife to NOT check BP so oftern as home, once per day is adequate

## 2022-05-31 NOTE — Assessment & Plan Note (Signed)
Also for urology referral 

## 2022-05-31 NOTE — Assessment & Plan Note (Signed)
Lab Results  Component Value Date   CREATININE 1.26 05/04/2022   Stable overall, cont to avoid nephrotoxins

## 2022-06-02 LAB — URINE CULTURE

## 2022-06-15 ENCOUNTER — Other Ambulatory Visit: Payer: Self-pay

## 2022-06-19 ENCOUNTER — Other Ambulatory Visit: Payer: Self-pay | Admitting: *Deleted

## 2022-06-20 ENCOUNTER — Other Ambulatory Visit (HOSPITAL_COMMUNITY): Payer: Self-pay | Admitting: Pain Medicine

## 2022-06-20 ENCOUNTER — Ambulatory Visit: Payer: BC Managed Care – PPO | Admitting: Internal Medicine

## 2022-06-20 DIAGNOSIS — I739 Peripheral vascular disease, unspecified: Secondary | ICD-10-CM

## 2022-06-21 ENCOUNTER — Ambulatory Visit (HOSPITAL_COMMUNITY)
Admission: RE | Admit: 2022-06-21 | Discharge: 2022-06-21 | Disposition: A | Discharge status: Home / Self Care | Payer: BC Managed Care – PPO | Source: Ambulatory Visit | Attending: Vascular Surgery | Admitting: Vascular Surgery

## 2022-06-21 DIAGNOSIS — I739 Peripheral vascular disease, unspecified: Secondary | ICD-10-CM | POA: Insufficient documentation

## 2022-06-21 LAB — VAS US ABI WITH/WO TBI
Left ABI: 1.18
Right ABI: 1.18

## 2022-06-23 ENCOUNTER — Encounter (HOSPITAL_COMMUNITY): Payer: BC Managed Care – PPO

## 2022-06-29 ENCOUNTER — Encounter: Payer: Self-pay | Admitting: Physician Assistant

## 2022-06-29 ENCOUNTER — Other Ambulatory Visit (INDEPENDENT_AMBULATORY_CARE_PROVIDER_SITE_OTHER): Payer: BC Managed Care – PPO

## 2022-06-29 ENCOUNTER — Ambulatory Visit: Payer: BC Managed Care – PPO | Admitting: Physician Assistant

## 2022-06-29 VITALS — BP 130/70 | HR 74 | Resp 18 | Ht 67.0 in | Wt 178.0 lb

## 2022-06-29 DIAGNOSIS — R202 Paresthesia of skin: Secondary | ICD-10-CM | POA: Diagnosis not present

## 2022-06-29 LAB — C-REACTIVE PROTEIN: CRP: <1 mg/dL (ref 0.5–20.0)

## 2022-06-29 LAB — VITAMIN B12: Vitamin B-12: 651 pg/mL (ref 211–911)

## 2022-06-29 LAB — TSH: TSH: 1.55 u[IU]/mL (ref 0.35–5.50)

## 2022-06-29 LAB — SEDIMENTATION RATE: Sed Rate: 24 mm/hr — ABNORMAL HIGH (ref 0–20)

## 2022-06-29 NOTE — Progress Notes (Signed)
Neurology clinic note  SERVICE DATE: 07/30/2022  Reason for Evaluation: Left leg Paresthesia    ASSESSMENT AND PLAN   77 y.o. male who presents for evaluation of paresthesias of the left lower extremity. He has a relevant medical history of labile hypertension, BPH, stage III CKD, cognitive deficits following cerebrovascular disease (2022), PTSD, vitamin D deficiency, thrombocytopenia, hyperbilirubinemia, and chronic back pain (L3 S1 ) and hip pain treated at a pain management center and injections.  Available MRI of the L-spine results showed moderate degenerative disc disease at the L5-S1, mild to moderate facet joint arthritis, without herniated disc, or spinal stenosis or foraminal stenosis  His neurological examination is pertinent for decreased L toes sensation especially great toe and 2nd toe, decreased vibration same area and decreased temperature sensation on all L toes of unclear etiology. Also noted, decreased abduction when compared to R toes otherwise normal strength. No pain on palpation at the L5-S1 area. Able to walk, slightly wide based. DTRs normal. ABI's were normal . Patient reports that his symptoms have improved since making this appointment a few weeks back with "more water and controlling my blood pressure"..  .    -Return to clinic pending on the results of the EMG /NCS  TSH, B12, B1, B6 ESR, CRP, copper, SPEP, IFE, folate, ANA      HPI: This is Mr. [name], a 77 y.o. R-handed male   who presents to neurology clinic with the chief complaint of L leg paresthesia. The patient is accompanied by his girlfriend who supplements the history.  Patient reports a history of chronic pain of the left back and hip starting around July 2023.  He receives injections at the L3 S1 facet MBB's, but had a negative response to the last treatment.  He complains of numbness of the left lower extremity with walking more than 2 blocks. He works as a Quarry manager.   He denies any changes  in temperature, but he feels like it could give way if he walks further beyond numbness.  He denies saddle numbness.  He had PT in the past, and was instructed to a home program that he has been doing, but the pain has not resolved.  He takes Tylenol as needed but without relief.  He had an MRI of the L-spine on 03/21/2022 remarkable for no compression fracture, mild retrolisthesis in the L5 on S1, moderate DDD on the L5-S1 without spinal or foraminal stenosis, mild DDD in the L4-L5 without spinal or foraminal stenosis, moderate joint arthritis in the L3-L4 without spinal or foraminal stenosis, L2-L3, and unremarkable L1-L2.   Pain clinic suspects that these L LE with prolonged walking or exercise is likely related to claudication, perhaps exercise-induced, but Dopplers were performed, and ABIs were normal. He reports that the symptoms are improving lately "after drinking more water and controlling blood pressure"   Muscle bulk loss?  No Muscle pain?   No Cramps/Twitching?   No Fatigable weakness?  No  Clumsiness/dropping grasped objects? No Can you arise from squatted position easily? Yes   Able to get out of chair without using arms?  Yes   Able to walk up steps easily?Yes  Use an assistive device to walk? No   Significant imbalance with walking? No  Falls? No Any change in urine color, especially after exertion/physical activity? No   The patient denies   diplopia, ptosis, dysphagia, poor saliva control, dysarthria/dysphonia, impaired mastication, facial weakness/droop. Denies respiratory weakness symptoms, particularly orthopnea>dyspnea.   "I don't  like cold  weather because my fingers get cold",  denies  excessive mucosal dryness, gastroparetic early satiety, postprandial abdominal bloating, Reports +constipation, bowel incontinence or constipation, + nocturia, and mild incontinence during the day.  Denies syncope/presyncope. Had orthostatic intolerance but improved after improved  hydration. ETOH use: No  Restrictive diet? "Trying to eat better" Family history of neuropathy/myopathy/NM disease?NO    MEDICATIONS:  Outpatient Encounter Medications as of 06/29/2022  Medication Sig   Cholecalciferol (VITAMIN D3) 50 MCG (2000 UT) capsule Take 2,000 Units by mouth daily.   QUEtiapine (SEROQUEL) 50 MG tablet Take 1 tablet (50 mg total) by mouth at bedtime.   vitamin B-12 (CYANOCOBALAMIN) 1000 MCG tablet Take 1,000 mcg by mouth daily.   vitamin C (ASCORBIC ACID) 500 MG tablet Take 500 mg by mouth daily.   acetaminophen (TYLENOL) 325 MG tablet Take 2 tablets (650 mg total) by mouth every 6 (six) hours as needed for mild pain or headache (fever >/= 101). (Patient not taking: Reported on 06/29/2022)   albuterol (VENTOLIN HFA) 108 (90 Base) MCG/ACT inhaler Inhale 1-2 puffs into the lungs every 4 (four) hours as needed for wheezing or shortness of breath. (Patient not taking: Reported on 06/29/2022)   Artificial Tear Ointment (DRY EYES OP) Place 1 drop into both eyes daily as needed (dry eyes). (Patient not taking: Reported on 06/29/2022)   lactulose (CHRONULAC) 10 GM/15ML solution Take 30 mLs (20 g total) by mouth 2 (two) times daily as needed for moderate constipation or severe constipation. (Patient not taking: Reported on 06/29/2022)   meloxicam (MOBIC) 15 MG tablet Take 0.5 tablets (7.5 mg total) by mouth daily. (Patient not taking: Reported on 06/29/2022)   Multiple Vitamins-Minerals (ONE-A-DAY MENS 50+) TABS Take 1 tablet by mouth daily. (Patient not taking: Reported on 06/29/2022)   tamsulosin (FLOMAX) 0.4 MG CAPS capsule Take 1 capsule (0.4 mg total) by mouth daily. (Patient not taking: Reported on 06/29/2022)   triamterene-hydrochlorothiazide (DYAZIDE) 37.5-25 MG capsule Take 1 each (1 capsule total) by mouth daily. (Patient not taking: Reported on 06/29/2022)   No facility-administered encounter medications on file as of 06/29/2022.    PAST MEDICAL HISTORY: Past Medical  History:  Diagnosis Date   Acute hypoxemic respiratory failure due to COVID-19 123456   Acute metabolic encephalopathy [id number]   related to COVID-19 infection   Chronic constipation    CKD (chronic kidney disease)    stage III   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 Currently on nifedipine 30 mg daily. Serial BPs: -02/03/13: 118/82.     History of COVID-19 11/19/2019   Hyperbilirubinemia    Hypotension 11/18/2019   Hypoxia 11/21/2019   related to COVID-19 infection   Leukocytosis 10/10/2019   Major depressive disorder 12/30/2013   Personal history of colonic polyps 12/30/2013   Per colonoscopy at [facility] per pt, about sept 2014   PTSD (post-traumatic stress disorder)    saw combat during [place] war   Thrombocytopenia 10/10/2019   Tubular adenoma of colon 2014   Visual hallucinations 03/2020   Vitamin D deficiency 08/07/2019    PAST SURGICAL HISTORY: Past Surgical History:  Procedure Laterality Date   COLONOSCOPY     MUSCLE RECESSION AND RESECTION Left 12/25/2018   Procedure: INFERIOR OBLIQUE RECESSION LEFT EYE, SUPERIOR OBLIQUE TUCK LEFT EYE;  Surgeon: Gevena Cotton, MD;  Location: [facility];  Service: Ophthalmology;  Laterality: Left;  UPPER GASTROINTESTINAL ENDOSCOPY      ALLERGIES: Allergies  Allergen Reactions   Lisinopril     Angioedema? Patient reports that his tongue and mouth became very swollen, was seen at [facility] for this in 2014.     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 No distress. Cooperative with exam.  Skin: No obvious rash or jaundice. HEENT: Atraumatic. Anicteric. Lungs: Non-labored breathing on room air  Heart: Regular Abdomen: Soft, non tender. Extremities: No edema. No obvious deformity.  Musculoskeletal: No obvious joint swelling. Psych: Affect appropriate.  Neurological: Mental Status: Alert. Speech fluent.   Cranial Nerves: CNII: No RAPD. Visual fields grossly intact. CNIII, IV, VI: PERRL. No nystagmus. EOMI. CN V: Facial sensation intact bilaterally to fine touch. Masseter clench strong. Jaw jerk negative. CN VII: Facial muscles symmetric and strong. No ptosis   CN VIII: Hearing grossly intact bilaterally. CN IX: mild hypophonia. CN X: Palate elevates symmetrically. CN XI: Full strength shoulder shrug bilaterally. CN XII: Tongue protrusion full and midline. No atrophy or fasciculations. No significant dysarthria  Motor: Tone is normal no fasciculations in all  extremities. no atrophy.  decreased abduction when compared to R toes otherwise normal strength.      DTR 1-2/4 Babinski: normal  response bilaterally   Sensation: Pinprick: decreased L toes sensation especially great toe and 2nd toe Vibration: decreased L toes especially great toe and 2nd toe Temperature: all L toes   No pain on palpation at the L5-S1 area.  Coordination: Intact finger-to- nose-finger bilaterally. Romberg negative.  Gait: Able to rise from chair with arms crossed  unassisted. Normal, slightly wide based-based gait. Able to tandem walk. Able to walk on toes and heels.        The impression above as well as the plan as outlined below were extensively discussed with the patient and girlfriend who voiced understanding. All questions were answered to their satisfaction.  The patient was counseled on pertinent fall precautions per the printed material provided today, and as noted under the "Patient Instructions" section below.   When available, results of the above investigations and possible further recommendations will be communicated to the patient via telephone/MyChart. Patient to call office if not contacted after expected testing turnaround time.   Total time spent reviewing records, interview, history/exam, documentation, and coordination of care on day of encounter:  55 min   Thank you for allowing me to participate in  patient's care.  If I can answer any additional questions, I would be pleased to do so.   Sharene Butters 06/29/2022 5:04 PM    CC: Biagio Borg, MD [place] [address]  CC: Referring provider: Jinger Neighbors, MD [address],  [place] [address]

## 2022-06-29 NOTE — Patient Instructions (Signed)
Labs today  Nerve conduction study Follow up pending on the results

## 2022-07-03 LAB — PROTEIN ELECTROPHORESIS, SERUM
Albumin ELP: 4.3 g/dL (ref 3.8–4.8)
Alpha 1: 0.3 g/dL (ref 0.2–0.3)
Alpha 2: 0.6 g/dL (ref 0.5–0.9)
Beta 2: 0.4 g/dL (ref 0.2–0.5)
Beta Globulin: 0.4 g/dL (ref 0.4–0.6)
Gamma Globulin: 1.3 g/dL (ref 0.8–1.7)
Total Protein: 7.3 g/dL (ref 6.1–8.1)

## 2022-07-03 LAB — VITAMIN B1: Vitamin B1 (Thiamine): 11 nmol/L (ref 8–30)

## 2022-07-05 ENCOUNTER — Encounter: Payer: Self-pay | Admitting: Internal Medicine

## 2022-07-05 ENCOUNTER — Ambulatory Visit: Payer: BC Managed Care – PPO | Admitting: Internal Medicine

## 2022-07-05 VITALS — BP 136/86 | HR 58 | Temp 97.8°F | Ht 67.0 in | Wt 182.0 lb

## 2022-07-05 DIAGNOSIS — R0989 Other specified symptoms and signs involving the circulatory and respiratory systems: Secondary | ICD-10-CM | POA: Diagnosis not present

## 2022-07-05 DIAGNOSIS — N1831 Chronic kidney disease, stage 3a: Secondary | ICD-10-CM

## 2022-07-05 DIAGNOSIS — Z0001 Encounter for general adult medical examination with abnormal findings: Secondary | ICD-10-CM | POA: Diagnosis not present

## 2022-07-05 DIAGNOSIS — E559 Vitamin D deficiency, unspecified: Secondary | ICD-10-CM | POA: Diagnosis not present

## 2022-07-05 NOTE — Assessment & Plan Note (Signed)
Last vitamin D Lab Results  Component Value Date   VD25OH 37.89 05/04/2022   Low, to start oral replacement  

## 2022-07-05 NOTE — Progress Notes (Signed)
Patient ID: [name], male   DOB: Mar 20, 1946, 77 y.o.   MRN: [id number]         Chief Complaint:: wellness exam and Medical Management of Chronic Issues (Bp is well maintained)  , ckd3a, low vit d       HPI:  [name] is a 77 y.o. male here for wellness exam; declines flu shot and pneumovax, for shingrix at pharmacy, o/w up to date                        Also Pt denies chest pain, increased sob or doe, wheezing, orthopnea, PND, increased LE swelling, palpitations, dizziness or syncope.   Pt denies polydipsia, polyuria, or new focal neuro s/s.    Pt denies fever, wt loss, night sweats, loss of appetite, or other constitutional symptoms   Gained several lbs recently.   Wt Readings from Last 3 Encounters:  07/05/22 182 lb (82.6 kg)  06/29/22 178 lb (80.7 kg)  05/30/22 174 lb (78.9 kg)   BP Readings from Last 3 Encounters:  07/05/22 136/86  06/29/22 130/70  05/30/22 132/78   Immunization History  Administered Date(s) Administered   Pneumococcal-Unspecified 12/07/2009   There are no preventive care reminders to display for this patient.     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 Currently on nifedipine 30 mg daily. Serial BPs: -02/03/13: 118/82.     History of COVID-19 11/19/2019   Hyperbilirubinemia    Hypotension 11/18/2019   Hypoxia 11/21/2019   related to COVID-19 infection   Leukocytosis 10/10/2019   Major depressive disorder 12/30/2013   Personal history of colonic polyps 12/30/2013   Per colonoscopy at [facility] per pt, about sept 2014   PTSD (post-traumatic  stress disorder)    saw combat during [place] war   Thrombocytopenia 10/10/2019   Tubular adenoma of colon 2014   Visual hallucinations 03/2020   Vitamin D deficiency 08/07/2019   Past Surgical History:  Procedure Laterality Date   COLONOSCOPY     MUSCLE RECESSION AND RESECTION Left 12/25/2018   Procedure: INFERIOR OBLIQUE RECESSION LEFT EYE, SUPERIOR OBLIQUE TUCK LEFT EYE;  Surgeon: Gevena Cotton, MD;  Location: [facility];  Service: Ophthalmology;  Laterality: Left;   UPPER GASTROINTESTINAL ENDOSCOPY      reports that he has never smoked. He has never used smokeless tobacco. He reports that he does not drink alcohol and does not use drugs. family history includes Hypertension in an other family member. Allergies  Allergen Reactions   Lisinopril     Angioedema? Patient reports that his tongue and mouth became very swollen, was seen at [facility] for this in 2014.    Current Outpatient Medications on File Prior to Visit  Medication Sig Dispense Refill   Cholecalciferol (VITAMIN D3) 50 MCG (2000 UT) capsule Take 2,000 Units by mouth daily.     QUEtiapine (SEROQUEL) 50 MG tablet Take 1 tablet (50 mg total) by mouth at bedtime. 90 tablet 1   vitamin B-12 (CYANOCOBALAMIN) 1000 MCG tablet Take 1,000 mcg by mouth daily.     vitamin C (ASCORBIC ACID) 500  MG tablet Take 500 mg by mouth daily.     acetaminophen (TYLENOL) 325 MG tablet Take 2 tablets (650 mg total) by mouth every 6 (six) hours as needed for mild pain or headache (fever >/= 101). (Patient not taking: Reported on 06/29/2022)     albuterol (VENTOLIN HFA) 108 (90 Base) MCG/ACT inhaler Inhale 1-2 puffs into the lungs every 4 (four) hours as needed for wheezing or shortness of breath. (Patient not taking: Reported on 06/29/2022)     Artificial Tear Ointment (DRY EYES OP) Place 1 drop into both eyes daily as needed (dry eyes). (Patient not taking: Reported on 06/29/2022)     lactulose (CHRONULAC) 10 GM/15ML solution Take 30 mLs  (20 g total) by mouth 2 (two) times daily as needed for moderate constipation or severe constipation. (Patient not taking: Reported on 06/29/2022) 236 mL 0   meloxicam (MOBIC) 15 MG tablet Take 0.5 tablets (7.5 mg total) by mouth daily. (Patient not taking: Reported on 06/29/2022) 7 tablet 0   Multiple Vitamins-Minerals (ONE-A-DAY MENS 50+) TABS Take 1 tablet by mouth daily. (Patient not taking: Reported on 06/29/2022)     tamsulosin (FLOMAX) 0.4 MG CAPS capsule Take 1 capsule (0.4 mg total) by mouth daily. (Patient not taking: Reported on 06/29/2022) 90 capsule 3   triamterene-hydrochlorothiazide (DYAZIDE) 37.5-25 MG capsule Take 1 each (1 capsule total) by mouth daily. (Patient not taking: Reported on 06/29/2022) 90 capsule 3   No current facility-administered medications on file prior to visit.        ROS:  All others reviewed and negative.  Objective        PE:  BP 136/86   Pulse (!) 58   Temp 97.8 F (36.6 C) (Oral)   Ht 5\' 7"  (1.702 m)   Wt 182 lb (82.6 kg)   SpO2 98%   BMI 28.51 kg/m                 Constitutional: Pt appears in NAD               HENT: Head: NCAT.                Right Ear: External ear normal.                 Left Ear: External ear normal.                Eyes: . Pupils are equal, round, and reactive to light. Conjunctivae and EOM are normal               Nose: without d/c or deformity               Neck: Neck supple. Gross normal ROM               Cardiovascular: Normal rate and regular rhythm.                 Pulmonary/Chest: Effort normal and breath sounds without rales or wheezing.                Abd:  Soft, NT, ND, + BS, no organomegaly               Neurological: Pt is alert. At baseline orientation, motor grossly intact               Skin: Skin is warm. No rashes, no other new lesions, LE edema - none  Psychiatric: Pt behavior is normal without agitation   Micro: none  Cardiac tracings I have personally interpreted today:  none  Pertinent  Radiological findings (summarize): none   Lab Results  Component Value Date   WBC 3.5 (L) 05/04/2022   HGB 13.0 05/04/2022   HCT 39.3 05/04/2022   PLT 118.0 (L) 05/04/2022   GLUCOSE 91 05/04/2022   CHOL 177 05/04/2022   TRIG 72.0 05/04/2022   HDL 67.90 05/04/2022   LDLCALC 95 05/04/2022   ALT 15 05/04/2022   AST 20 05/04/2022   NA 141 05/04/2022   K 4.2 05/04/2022   CL 103 05/04/2022   CREATININE 1.26 05/04/2022   BUN 12 05/04/2022   CO2 30 05/04/2022   TSH 1.55 06/29/2022   PSA 0.68 06/22/2021   HGBA1C 5.4 05/04/2022   Assessment/Plan:  Polly Putnam is a 77 y.o. Black or African American [2] male with  has a past medical history of Acute hypoxemic respiratory failure due to COVID-19 (123456), Acute metabolic encephalopathy (A999333), Chronic constipation, CKD (chronic kidney disease), Cognitive deficits following cerebrovascular disease (06/08/2020), Essential hypertension (02/17/2010), History of COVID-19 (11/19/2019), Hyperbilirubinemia, Hypotension (11/18/2019), Hypoxia (11/21/2019), Leukocytosis (10/10/2019), Major depressive disorder (12/30/2013), Personal history of colonic polyps (12/30/2013), PTSD (post-traumatic stress disorder), Thrombocytopenia (10/10/2019), Tubular adenoma of colon (2014), Visual hallucinations (03/2020), and Vitamin D deficiency (08/07/2019).  Vitamin D deficiency Last vitamin D Lab Results  Component Value Date   VD25OH 37.89 05/04/2022   Low, to start oral replacement   Encounter for well adult exam with abnormal findings Age and sex appropriate education and counseling updated with regular exercise and diet Referrals for preventative services - none needed Immunizations addressed - declines flu shot, pneumovax, but for shingrix at pharmacy Smoking counseling  - none needed Evidence for depression or other mood disorder - none significant Most recent labs reviewed. I have personally reviewed and have noted: 1) the patient's  medical and social history 2) The patient's current medications and supplements 3) The patient's height, weight, and BMI have been recorded in the chart   CKD (chronic kidney disease), stage III Lab Results  Component Value Date   CREATININE 1.26 05/04/2022   Stable overall, cont to avoid nephrotoxins   Labile hypertension BP Readings from Last 3 Encounters:  07/05/22 136/86  06/29/22 130/70  05/30/22 132/78   Stable, pt to continue medical treatment dyazide 1 qd  Followup: Return in about 6 months (around 01/05/2023).  Cathlean Cower, MD 07/08/2022 9:14 PM [facility]

## 2022-07-05 NOTE — Patient Instructions (Signed)
Please continue all other medications as before, and refills have been done if requested. ? ?Please have the pharmacy call with any other refills you may need. ? ?Please continue your efforts at being more active, low cholesterol diet, and weight control. ? ?You are otherwise up to date with prevention measures today. ? ?Please keep your appointments with your specialists as you may have planned ? ?We can hold on more lab testing today ? ?Please make an Appointment to return in 6 months, or sooner if needed ?

## 2022-07-08 ENCOUNTER — Encounter: Payer: Self-pay | Admitting: Internal Medicine

## 2022-07-08 NOTE — Assessment & Plan Note (Signed)
Age and sex appropriate education and counseling updated with regular exercise and diet Referrals for preventative services - none needed Immunizations addressed - declines flu shot, pneumovax, but for shingrix at pharmacy Smoking counseling  - none needed Evidence for depression or other mood disorder - none significant Most recent labs reviewed. I have personally reviewed and have noted: 1) the patient's medical and social history 2) The patient's current medications and supplements 3) The patient's height, weight, and BMI have been recorded in the chart

## 2022-07-08 NOTE — Assessment & Plan Note (Signed)
Lab Results  Component Value Date   CREATININE 1.26 05/04/2022   Stable overall, cont to avoid nephrotoxins  

## 2022-07-08 NOTE — Assessment & Plan Note (Signed)
BP Readings from Last 3 Encounters:  07/05/22 136/86  06/29/22 130/70  05/30/22 132/78   Stable, pt to continue medical treatment dyazide 1 qd

## 2022-07-11 LAB — ANTI-NUCLEAR AB-TITER (ANA TITER): ANA Titer 1: 1:80 {titer} — ABNORMAL HIGH

## 2022-07-11 LAB — IMMUNOFIXATION ELECTROPHORESIS
IgG (Immunoglobin G), Serum: 1586 mg/dL — ABNORMAL HIGH (ref 600–1540)
IgM, Serum: 66 mg/dL (ref 50–300)
Immunoglobulin A: 308 mg/dL (ref 70–320)

## 2022-07-11 LAB — COPPER, SERUM: Copper: 116 ug/dL (ref 70–175)

## 2022-07-11 LAB — ANA: Anti Nuclear Antibody (ANA): POSITIVE — AB

## 2022-08-07 ENCOUNTER — Ambulatory Visit (INDEPENDENT_AMBULATORY_CARE_PROVIDER_SITE_OTHER): Payer: BC Managed Care – PPO | Admitting: Neurology

## 2022-08-07 ENCOUNTER — Telehealth: Payer: Self-pay

## 2022-08-07 DIAGNOSIS — R202 Paresthesia of skin: Secondary | ICD-10-CM

## 2022-08-07 DIAGNOSIS — G629 Polyneuropathy, unspecified: Secondary | ICD-10-CM

## 2022-08-07 DIAGNOSIS — M5417 Radiculopathy, lumbosacral region: Secondary | ICD-10-CM

## 2022-08-07 MED ORDER — HYDROCHLOROTHIAZIDE 25 MG PO TABS: 25.0000 mg | ORAL_TABLET | Freq: Every day | ORAL | 3 refills | Status: DC

## 2022-08-07 NOTE — Procedures (Signed)
  [facility] Neurology  [address] [place], [address]  [place], [place] [address] Tel: [phone] Fax: [phone] Test Date:  08/07/2022  Patient: [name] DOB: 1946-04-13 Physician: Jacquelyne Balint, MD  Sex: Male Height:  Ref Phys: Marlowe Kays, PA  ID#: [id number]   Technician:    History: This is a 77 year old male with paresthesias of his left lower limb.  NCV & EMG Findings: Extensive electrodiagnostic evaluation of the left lower limb shows: Absent left superficial peroneal/fibular sensory response. Left sural sensory response is within normal limits. Left peroneal/fibular (EDB) and tibial (AH) motor responses are within normal limits. Left H reflex is absent. Chronic motor axon loss changes without accompanying active denervation changes are seen in the left medial head of gastrocnemius, short head of biceps femoris, and lumbosacral paraspinal (L5 level) muscles.  Impression: This is an abnormal study. 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 No electrodiagnostic evidence of a large fiber sensorimotor neuropathy. Absent superficial peroneal/fibular sensory response is likely normal for age versus technical given no other findings to support peroneal/fibular neuropathy.    ___________________________ Jacquelyne Balint, MD    Nerve Conduction Studies Motor Nerve Results    Latency Amplitude F-Lat Segment Distance CV Comment  Site (ms) Norm (mV) Norm (ms)  (cm) (m/s) Norm   Left Fibular (EDB) Motor  Ankle 4.0  < 6.0 3.0  > 2.5        Bel fib head 11.0 - 2.8 -  Bel fib head-Ankle 32.5 46  > 40   Pop fossa 13.0 - 2.7 -  Pop fossa-Bel fib head 9 45 -   Left Tibial (AH) Motor  Ankle 4.4  < 6.0 10.3  > 4.0        Knee 14.1 - 7.6 -  Knee-Ankle 41.5 43  > 40    Sensory Sites    Neg Peak Lat Amplitude (O-P) Segment  Distance Velocity Comment  Site (ms) Norm (V) Norm  (cm) (ms)   Left Superficial Fibular Sensory  14 cm-Ankle *NR  < 4.6 *NR  > 3 14 cm-Ankle 14    Left Sural Sensory  Calf-Lat mall 3.2  < 4.6 3  > 3 Calf-Lat mall 14     H-Reflex Results    M-Lat H Lat H Neg Amp H-M Lat  Site (ms) (ms) Norm (mV) (ms)  Left Tibial H-Reflex  Pop fossa 5.9 ---  < 35.0 --- ---   Electromyography   Side Muscle Ins.Act Fibs Fasc Recrt Amp Dur Poly Activation Comment  Left Tib ant Nml Nml Nml Nml Nml Nml Nml Nml N/A  Left Gastroc MH Nml Nml Nml *1- *1+ *1+ *1+ Nml N/A  Left Rectus fem Nml Nml Nml Nml Nml Nml Nml Nml N/A  Left Biceps fem SH Nml Nml Nml *1- *1+ *1+ *1+ Nml N/A  Left Gluteus med Nml Nml Nml Nml Nml Nml Nml Nml N/A  Left Lumbar PSP lower Nml Nml Nml *1- *1+ *1+ *1+ Nml N/A      Waveforms:  Motor      Sensory      H-Reflex

## 2022-08-07 NOTE — Telephone Encounter (Signed)
Spoke with pts wife and was able to inform her of Dr. Raphael Gibney instruction's. Pts wife is now going to pick up meds and monitor pts BP for the next 7 days and call back with an update.

## 2022-08-07 NOTE — Addendum Note (Signed)
Addended by: Corwin Levins on: 08/07/2022 09:37 AM   Modules accepted: Orders

## 2022-08-07 NOTE — Progress Notes (Signed)
The nerve conduction study shows old, chronic  changes, no new findings. Recommend physical therapy , no new meds are indicated

## 2022-08-07 NOTE — Telephone Encounter (Signed)
Ok to change to hct 25 mg per day,.which will be about half of what he is currently taking - done erx  Please continue to monitor Bps and elt [place] know if any further lows

## 2022-08-07 NOTE — Telephone Encounter (Signed)
Pts wife states the   triamterene-hydrochlorothiazide (DYAZIDE) 37.5-25 MG capsule   is making the pt dizzy and sick. Pts wife is asking can the pt take a different med or a lower dose of the medication. When the pt takes the medication pt BP drops to 60's/49's-50's.  [phone].

## 2022-08-08 NOTE — Addendum Note (Signed)
Addended by: Allean Found R on: 08/08/2022 03:20 PM   Modules accepted: Orders

## 2022-08-08 NOTE — Progress Notes (Signed)
To back at 1054

## 2022-08-15 ENCOUNTER — Ambulatory Visit: Payer: BC Managed Care – PPO | Attending: Physician Assistant

## 2022-08-15 DIAGNOSIS — R2689 Other abnormalities of gait and mobility: Secondary | ICD-10-CM | POA: Insufficient documentation

## 2022-08-15 DIAGNOSIS — G629 Polyneuropathy, unspecified: Secondary | ICD-10-CM | POA: Insufficient documentation

## 2022-08-15 DIAGNOSIS — M6281 Muscle weakness (generalized): Secondary | ICD-10-CM | POA: Diagnosis present

## 2022-08-15 NOTE — Therapy (Signed)
OUTPATIENT PHYSICAL THERAPY NEURO EVALUATION   Patient Name: [name] MRN: [id number] DOB:Jan 08, 1946, 77 y.o., male Today's Date: 08/15/2022   PCP: Oliver Barre, MD REFERRING PROVIDER: Marcos Eke, PA-C  END OF SESSION:  PT End of Session - 08/15/22 0743     Visit Number 1    Number of Visits 5    Date for PT Re-Evaluation 09/15/22    Authorization Type BCBS    PT Start Time 0801    PT Stop Time 0840    PT Time Calculation (min) 39 min    Equipment Utilized During Treatment Gait belt    Activity Tolerance Patient tolerated treatment well    Behavior During Therapy WFL for tasks assessed/performed;Flat affect             Past Medical History:  Diagnosis Date   Acute hypoxemic respiratory failure due to COVID-19 11/18/2019   Acute metabolic encephalopathy 11/21/2019   related to COVID-19 infection   Chronic constipation    CKD (chronic kidney disease)    stage III   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 Currently on nifedipine 30 mg daily. Serial BPs: -02/03/13: 118/82.     History of COVID-19 11/19/2019   Hyperbilirubinemia    Hypotension 11/18/2019   Hypoxia 11/21/2019   related to COVID-19 infection   Leukocytosis 10/10/2019   Major depressive disorder 12/30/2013   Personal history of colonic polyps 12/30/2013   Per colonoscopy at [facility] per pt, about sept 2014   PTSD (post-traumatic stress disorder)    saw combat during [place] war   Thrombocytopenia 10/10/2019   Tubular adenoma of colon 2014   Visual hallucinations 03/2020   Vitamin D deficiency 08/07/2019   Past Surgical History:  Procedure Laterality Date   COLONOSCOPY     MUSCLE RECESSION AND RESECTION Left 12/25/2018   Procedure: INFERIOR OBLIQUE RECESSION LEFT EYE, SUPERIOR OBLIQUE TUCK LEFT EYE;  Surgeon: Aura Camps, MD;  Location:  [facility];  Service: Ophthalmology;  Laterality: Left;   UPPER GASTROINTESTINAL ENDOSCOPY     Patient Active Problem List   Diagnosis Date Noted   Urinary frequency 05/31/2022   Bradycardia 03/02/2022   BPH (benign prostatic hyperplasia) 12/22/2021   Urinary incontinence, male, stress 12/22/2021   AKI (acute kidney injury) (HCC) 08/07/2021   Encounter for well adult exam with abnormal findings 06/22/2021   Low back pain 06/22/2021   Insomnia 08/03/2020   Cervical spinal stenosis 07/19/2020   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   History of colonic polyp    Hyperbilirubinemia    CKD (chronic kidney disease), stage III 11/21/2019   Acute metabolic encephalopathy 11/21/2019   History of COVID-19 11/21/2019   Thrombocytopenia 10/10/2019   Vitamin D deficiency 08/07/2019   Chronic constipation 09/16/2018   Erectile dysfunction 07/11/2017   Major depressive disorder 12/30/2013   Personal history of colonic polyps 12/30/2013   PTSD (post-traumatic stress disorder) 02/03/2013   Labile hypertension 02/17/2010    ONSET DATE: 08/08/2022 referral  REFERRING DIAG: G62.9 (ICD-10-CM) - Neuropathy  THERAPY DIAG:  Muscle weakness (generalized)  Other abnormalities of gait and mobility  Rationale for Evaluation and Treatment: Rehabilitation  SUBJECTIVE:  SUBJECTIVE STATEMENT: Patient arrives to clinic with SO, not using AD. "I'm trying to complete my experience with my injury."Patient voicing that he wants to get rid of numbness in his L leg. After a few mins of walking he'll get numbness in his L leg. Patient reports he has been experiencing this for at least a year. Did fall off a ladder a significant time ago and only went to a chiropractor. Reports only mild  weakness in the L LE. Pt accompanied by: significant other  PERTINENT HISTORY: labile hypertension, BPH, stage III CKD, cognitive deficits following cerebrovascular disease (2022), PTSD, vitamin D deficiency, thrombocytopenia, hyperbilirubinemia, and chronic back pain (L3 S1 ) and hip pain  PAIN:  Are you having pain? No  PRECAUTIONS: Fall  WEIGHT BEARING RESTRICTIONS: No  FALLS: Has patient fallen in last 6 months? No  LIVING ENVIRONMENT: Lives with: lives alone Lives in: House/apartment Stairs: Yes: External: 3 steps; can reach both Has following equipment at home: shower chair and Grab bars  PLOF: Independent  PATIENT GOALS: "to fix the numbness in my leg"  OBJECTIVE:   DIAGNOSTIC FINDINGS: 05/27/20 brain MRI IMPRESSION: 1. No acute intracranial abnormality. 2. Moderate chronic microvascular ischemic change. 3. Moderate scattered microhemorrhages, which may reflect chronic hypertension or amyloid angiopathy. 4. The midbrain to pons ratio is approximately 0.16, which is decreased and borderline for progressive supranuclear palsy. Recommend clinical correlation. 5. Upper cervical spine degenerative change with suspected at least moderate canal stenosis at C3-C4. An MRI of the cervical spine could further characterize if clinically indicated.  COGNITION: Overall cognitive status: History of cognitive impairments - at baseline   SENSATION: Light touch: Impaired  Hot/Cold: Impaired  Proprioception: Impaired  All impaired in distal L LE and L hip COORDINATION: WFL B LE heel/shin and figure 8  WNL RAM B UE/LE  POSTURE: posterior pelvic tilt  LOWER EXTREMITY MMT:    MMT Right Eval Left Eval  Hip flexion 4 4  Hip extension    Hip abduction 5 5  Hip adduction 5 5  Hip internal rotation    Hip external rotation    Knee flexion 4 4  Knee extension 5 4+  Ankle dorsiflexion 5 5  Ankle plantarflexion    Ankle inversion    Ankle eversion    (Blank rows = not  tested)  BED MOBILITY:  Able to complete independently  TRANSFERS: Assistive device utilized: None  Sit to stand: Complete Independence Stand to sit: Complete Independence Chair to chair: Complete Independence  STAIRS: Level of Assistance: SBA Stair Negotiation Technique: Alternating Pattern  with No Rails Number of Stairs: 4  Height of Stairs: 6   GAIT: Gait pattern: step through pattern, decreased arm swing- Right, decreased arm swing- Left, trendelenburg, and narrow BOS Distance walked: clinic Assistive device utilized: None Level of assistance: Modified independence Comments: needs explicit single step cues for directions   FUNCTIONAL TESTS:   [facility] PT Assessment - 08/15/22 0001       Standardized Balance Assessment   Standardized Balance Assessment Timed Up and Go Test    Five times sit to stand comments  13.32 R UE    10 Meter Walk .71m/s      Timed Up and Go Test   Normal TUG (seconds) 12.25      Functional Gait  Assessment   Gait assessed  Yes    Gait Level Surface Walks 20 ft in less than 7 sec but greater than 5.5 sec, uses assistive device, slower speed, mild  gait deviations, or deviates 6-10 in outside of the 12 in walkway width.    Change in Gait Speed Able to smoothly change walking speed without loss of balance or gait deviation. Deviate no more than 6 in outside of the 12 in walkway width.    Gait with Horizontal Head Turns Performs head turns smoothly with slight change in gait velocity (eg, minor disruption to smooth gait path), deviates 6-10 in outside 12 in walkway width, or uses an assistive device.    Gait with Vertical Head Turns Performs task with slight change in gait velocity (eg, minor disruption to smooth gait path), deviates 6 - 10 in outside 12 in walkway width or uses assistive device    Gait and Pivot Turn Pivot turns safely in greater than 3 sec and stops with no loss of balance, or pivot turns safely within 3 sec and stops with mild  imbalance, requires small steps to catch balance.    Step Over Obstacle Is able to step over 2 stacked shoe boxes taped together (9 in total height) without changing gait speed. No evidence of imbalance.    Gait with Narrow Base of Support Is able to ambulate for 10 steps heel to toe with no staggering.    Gait with Eyes Closed Walks 20 ft, uses assistive device, slower speed, mild gait deviations, deviates 6-10 in outside 12 in walkway width. Ambulates 20 ft in less than 9 sec but greater than 7 sec.    Ambulating Backwards Walks 20 ft, uses assistive device, slower speed, mild gait deviations, deviates 6-10 in outside 12 in walkway width.    Steps Alternating feet, no rail.    Total Score 24               TODAY'S TREATMENT:                                                                                                                              N/a eval    PATIENT EDUCATION: Education details: PT POC, exam findings, rehab prognosis  Person educated: Patient and SO Education method: Explanation Education comprehension: verbalized understanding and needs further education  HOME EXERCISE PROGRAM: To be provided  GOALS: Goals reviewed with patient? Yes  SHORT TERM GOALS: = LTG based on PT POC LONG TERM GOALS: Target date: 09/15/22  Pt will be independent with final HEP for improved functional strength/endurance  Baseline: to be provided Goal status: INITIAL  2.  Pt will improve TUG to </= 10 secs to demonstrated reduced fall risk  Baseline: 12.25s  Goal status: INITIAL  3.  Pt will improve gait speed to >/= 1.38m/s to demonstrate improved community ambulation  Baseline: .29m/s Goal status: INITIAL  4.  Pt will improve 5x STS to </= 10 sec to demo improved functional LE strength and balance   Baseline: 13.32s R UE Goal status: INITIAL  5.  Pt will improve FGA to >/= 26/30 to demonstrate improved  balance and reduced fall risk  Baseline: 24/30 Goal status:  INITIAL  ASSESSMENT:  CLINICAL IMPRESSION: Patient is a 77 y.o. male who was seen today for physical therapy evaluation and treatment for gait impairments. Patient with only very mild gait impairments, including trendelenburg and NBOS. Patient scored a 24/30 on  Functional Gait Assessment.   <22/30 = predictive of falls, <20/30 = fall in 6 months, <18/30 = predictive of falls in PD MCID: 5 points stroke population, 4 points geriatric population (ANPTA Core Set of Outcome Measures for Adults with Neurologic Conditions, 2018). 10 Meter Walk Test: Patient instructed to walk 10 meters (32.8 ft) as quickly and as safely as possible at their normal speed x2 and at a fast speed x2. Time measured from 2 meter mark to 8 meter mark to accommodate ramp-up and ramp-down.  Normal speed: .39m/s Cut off scores: <0.4 m/s = household Ambulator, 0.4-0.8 m/s = limited community Ambulator, >0.8 m/s = community Ambulator, >1.2 m/s = crossing a street, <1.0 = increased fall risk MCID 0.05 m/s (small), 0.13 m/s (moderate), 0.06 m/s (significant)  (ANPTA Core Set of Outcome Measures for Adults with Neurologic Conditions, 2018). Five times Sit to Stand Test (FTSS) Method: Use a straight back chair with a solid seat that is 17-18" high. Ask participant to sit on the chair with arms folded across their chest.   Instructions: "Stand up and sit down as quickly as possible 5 times, keeping your arms folded across your chest."   Measurement: Stop timing when the participant touches the chair in sitting the 5th time.  TIME: 13.32 sec  Cut off scores indicative of increased fall risk: >12 sec CVA, >16 sec PD, >13 sec vestibular (ANPTA Core Set of Outcome Measures for Adults with Neurologic Conditions, 2018). Patient completed the Timed Up and Go test (TUG) in 12.25 seconds.  Geriatrics: need for further assessment of fall risk: ? 12 sec; Recurrent falls: > 15 sec; Vestibular Disorders fall risk: > 15 sec; Parkinson's  Disease fall risk: > 16 sec ([email or web address], 2023). Patient would benefit from skilled PT services to address the above mentioned deficits.    OBJECTIVE IMPAIRMENTS: Abnormal gait, decreased cognition, decreased knowledge of condition, decreased strength, decreased safety awareness, impaired sensation, and postural dysfunction.   ACTIVITY LIMITATIONS: carrying, lifting, bending, squatting, stairs, locomotion level, and caring for others  PARTICIPATION LIMITATIONS: meal prep, cleaning, laundry, interpersonal relationship, driving, shopping, community activity, and yard work  PERSONAL FACTORS: Age, Behavior pattern, Past/current experiences, Time since onset of injury/illness/exacerbation, Transportation, and 3+ comorbidities: see above  are also affecting patient's functional outcome.   REHAB POTENTIAL: Fair time since onset  CLINICAL DECISION MAKING: Stable/uncomplicated  EVALUATION COMPLEXITY: Low  PLAN:  PT FREQUENCY: 1x/week  PT DURATION: 4 weeks  PLANNED INTERVENTIONS: Therapeutic exercises, Therapeutic activity, Neuromuscular re-education, Balance training, Gait training, Patient/Family education, Self Care, Joint mobilization, Stair training, Vestibular training, Visual/preceptual remediation/compensation, Orthotic/Fit training, DME instructions, Aquatic Therapy, Manual therapy, and Re-evaluation  PLAN FOR NEXT SESSION: HEP, hip strengthening    Westley Foots, PT, DPT, CBIS 08/15/2022, 8:59 AM

## 2022-08-22 ENCOUNTER — Ambulatory Visit: Payer: BC Managed Care – PPO | Attending: Physician Assistant

## 2022-08-22 DIAGNOSIS — M6281 Muscle weakness (generalized): Secondary | ICD-10-CM

## 2022-08-22 DIAGNOSIS — R2689 Other abnormalities of gait and mobility: Secondary | ICD-10-CM | POA: Diagnosis present

## 2022-08-22 NOTE — Therapy (Signed)
OUTPATIENT PHYSICAL THERAPY NEURO TREATMENT   Patient Name: [name] MRN: [id number] DOB:1946-01-17, 77 y.o., male Today's Date: 08/22/2022   PCP: Oliver Barre, MD REFERRING PROVIDER: Marcos Eke, PA-C  END OF SESSION:  PT End of Session - 08/22/22 0741     Visit Number 2    Number of Visits 5    Date for PT Re-Evaluation 09/15/22    Authorization Type BCBS    PT Start Time 0800    PT Stop Time 0843    PT Time Calculation (min) 43 min    Activity Tolerance Patient tolerated treatment well    Behavior During Therapy [facility] for tasks assessed/performed;Flat affect             Past Medical History:  Diagnosis Date   Acute hypoxemic respiratory failure due to COVID-19 11/18/2019   Acute metabolic encephalopathy 11/21/2019   related to COVID-19 infection   Chronic constipation    CKD (chronic kidney disease)    stage III   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 Currently on nifedipine 30 mg daily. Serial BPs: -02/03/13: 118/82.     History of COVID-19 11/19/2019   Hyperbilirubinemia    Hypotension 11/18/2019   Hypoxia 11/21/2019   related to COVID-19 infection   Leukocytosis 10/10/2019   Major depressive disorder 12/30/2013   Personal history of colonic polyps 12/30/2013   Per colonoscopy at [facility] per pt, about sept 2014   PTSD (post-traumatic stress disorder)    saw combat during [place] war   Thrombocytopenia 10/10/2019   Tubular adenoma of colon 2014   Visual hallucinations 03/2020   Vitamin D deficiency 08/07/2019   Past Surgical History:  Procedure Laterality Date   COLONOSCOPY     MUSCLE RECESSION AND RESECTION Left 12/25/2018   Procedure: INFERIOR OBLIQUE RECESSION LEFT EYE, SUPERIOR OBLIQUE TUCK LEFT EYE;  Surgeon: Aura Camps, MD;  Location: [facility];  Service: Ophthalmology;   Laterality: Left;   UPPER GASTROINTESTINAL ENDOSCOPY     Patient Active Problem List   Diagnosis Date Noted   Urinary frequency 05/31/2022   Bradycardia 03/02/2022   BPH (benign prostatic hyperplasia) 12/22/2021   Urinary incontinence, male, stress 12/22/2021   AKI (acute kidney injury) (HCC) 08/07/2021   Encounter for well adult exam with abnormal findings 06/22/2021   Low back pain 06/22/2021   Insomnia 08/03/2020   Cervical spinal stenosis 07/19/2020   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   History of colonic polyp    Hyperbilirubinemia    CKD (chronic kidney disease), stage III 11/21/2019   Acute metabolic encephalopathy 11/21/2019   History of COVID-19 11/21/2019   Thrombocytopenia 10/10/2019   Vitamin D deficiency 08/07/2019   Chronic constipation 09/16/2018   Erectile dysfunction 07/11/2017   Major depressive disorder 12/30/2013   Personal history of colonic polyps 12/30/2013   PTSD (post-traumatic stress disorder) 02/03/2013   Labile hypertension 02/17/2010    ONSET DATE: 08/08/2022 referral  REFERRING DIAG: G62.9 (ICD-10-CM) - Neuropathy  THERAPY DIAG:  Muscle weakness (generalized)  Other abnormalities of gait and mobility  Rationale for Evaluation and Treatment: Rehabilitation  SUBJECTIVE:  SUBJECTIVE STATEMENT: Patient arrives to clinic with SO. Denies falls/near falls. Biggest complaint is not being able to go up and down the stairs quickly. Last walked for ~30 mins on Friday and reports some numbness in L thigh and "heat."  Pt accompanied by: significant other  PERTINENT HISTORY: labile hypertension, BPH, stage III CKD, cognitive deficits following cerebrovascular disease (2022), PTSD, vitamin D deficiency, thrombocytopenia, hyperbilirubinemia,  and chronic back pain (L3 S1 ) and hip pain  PAIN:  Are you having pain? No  PRECAUTIONS: Fall  PATIENT GOALS: "to fix the numbness in my leg"  OBJECTIVE:   DIAGNOSTIC FINDINGS: 05/27/20 brain MRI IMPRESSION: 1. No acute intracranial abnormality. 2. Moderate chronic microvascular ischemic change. 3. Moderate scattered microhemorrhages, which may reflect chronic hypertension or amyloid angiopathy. 4. The midbrain to pons ratio is approximately 0.16, which is decreased and borderline for progressive supranuclear palsy. Recommend clinical correlation. 5. Upper cervical spine degenerative change with suspected at least moderate canal stenosis at C3-C4. An MRI of the cervical spine could further characterize if clinically indicated.  TODAY'S TREATMENT:                                                                                                                              -scifit hills level 2 x 10 mins B LE for improved LE strength/endurance  -provided HEP (see below)    PATIENT EDUCATION: Education details: initial HEP Person educated: Patient and SO Education method: Explanation Education comprehension: verbalized understanding and needs further education  HOME EXERCISE PROGRAM: Access Code: [email or web address] URL: [email or web address] Date: 08/22/2022 Prepared by: Merry Lofty  Exercises - Supine Bridge  - 1 x daily - 7 x weekly - 3 sets - 10 reps - Clamshell  - 1 x daily - 7 x weekly - 3 sets - 10 reps - Sit to Stand  - 1 x daily - 7 x weekly - 3 sets - 5-7 reps - Mini Squat with Counter Support  - 1 x daily - 7 x weekly - 3 sets - 10 reps - Heel Raises with Counter Support  - 1 x daily - 7 x weekly - 3 sets - 10 reps - Standing March with Counter Support  - 1 x daily - 7 x weekly - 3 sets - 10 reps  GOALS: Goals reviewed with patient? Yes  SHORT TERM GOALS: = LTG based on PT POC LONG TERM GOALS: Target date: 09/15/22  Pt will be independent with final  HEP for improved functional strength/endurance  Baseline: to be provided Goal status: INITIAL  2.  Pt will improve TUG to </= 10 secs to demonstrated reduced fall risk  Baseline: 12.25s  Goal status: INITIAL  3.  Pt will improve gait speed to >/= 1.33m/s to demonstrate improved community ambulation  Baseline: .68m/s Goal status: INITIAL  4.  Pt will improve 5x STS to </= 10 sec to demo improved functional LE strength and balance   Baseline: 13.32s R  UE Goal status: INITIAL  5.  Pt will improve FGA to >/= 26/30 to demonstrate improved balance and reduced fall risk  Baseline: 24/30 Goal status: INITIAL  ASSESSMENT:  CLINICAL IMPRESSION: Patient seen for skilled PT session with emphasis on establishing HEP. He requires frequent cuing for proper form and consistency of exercises. Patient demonstrating delayed processing and need for frequent cuing. Continue POC.    OBJECTIVE IMPAIRMENTS: Abnormal gait, decreased cognition, decreased knowledge of condition, decreased strength, decreased safety awareness, impaired sensation, and postural dysfunction.   ACTIVITY LIMITATIONS: carrying, lifting, bending, squatting, stairs, locomotion level, and caring for others  PARTICIPATION LIMITATIONS: meal prep, cleaning, laundry, interpersonal relationship, driving, shopping, community activity, and yard work  PERSONAL FACTORS: Age, Behavior pattern, Past/current experiences, Time since onset of injury/illness/exacerbation, Transportation, and 3+ comorbidities: see above  are also affecting patient's functional outcome.   REHAB POTENTIAL: Fair time since onset  CLINICAL DECISION MAKING: Stable/uncomplicated  EVALUATION COMPLEXITY: Low  PLAN:  PT FREQUENCY: 1x/week  PT DURATION: 4 weeks  PLANNED INTERVENTIONS: Therapeutic exercises, Therapeutic activity, Neuromuscular re-education, Balance training, Gait training, Patient/Family education, Self Care, Joint mobilization, Stair training,  Vestibular training, Visual/preceptual remediation/compensation, Orthotic/Fit training, DME instructions, Aquatic Therapy, Manual therapy, and Re-evaluation  PLAN FOR NEXT SESSION: hip strengthening, balance   Westley Foots, PT, DPT, CBIS 08/22/2022, 9:13 AM

## 2022-08-29 ENCOUNTER — Encounter: Payer: Self-pay | Admitting: Physical Therapy

## 2022-08-29 ENCOUNTER — Ambulatory Visit: Payer: BC Managed Care – PPO | Admitting: Physical Therapy

## 2022-08-29 VITALS — BP 102/85

## 2022-08-29 DIAGNOSIS — M6281 Muscle weakness (generalized): Secondary | ICD-10-CM | POA: Diagnosis not present

## 2022-08-29 DIAGNOSIS — R2689 Other abnormalities of gait and mobility: Secondary | ICD-10-CM

## 2022-08-29 NOTE — Therapy (Signed)
OUTPATIENT PHYSICAL THERAPY NEURO TREATMENT   Patient Name: [name] MRN: [id number] DOB:[birth year], 77 y.o., male Today's Date: 08/29/2022   PCP: Oliver Barre, MD REFERRING PROVIDER: Marcos Eke, PA-C  END OF SESSION:  PT End of Session - 08/29/22 0803     Visit Number 3    Number of Visits 5    Date for PT Re-Evaluation 09/15/22    Authorization Type BCBS    PT Start Time 0801    PT Stop Time 0845    PT Time Calculation (min) 44 min    Equipment Utilized During Treatment Gait belt    Activity Tolerance Patient tolerated treatment well    Behavior During Therapy [facility] for tasks assessed/performed;Flat affect             Past Medical History:  Diagnosis Date   Acute hypoxemic respiratory failure due to COVID-19 11/18/2019   Acute metabolic encephalopathy 11/21/2019   related to COVID-19 infection   Chronic constipation    CKD (chronic kidney disease)    stage III   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 Currently on nifedipine 30 mg daily. Serial BPs: -02/03/13: 118/82.     History of COVID-19 11/19/2019   Hyperbilirubinemia    Hypotension 11/18/2019   Hypoxia 11/21/2019   related to COVID-19 infection   Leukocytosis 10/10/2019   Major depressive disorder 12/30/2013   Personal history of colonic polyps 12/30/2013   Per colonoscopy at [facility] per pt, about sept 2014   PTSD (post-traumatic stress disorder)    saw combat during [place] war   Thrombocytopenia 10/10/2019   Tubular adenoma of colon 2014   Visual hallucinations 03/2020   Vitamin D deficiency 08/07/2019   Past Surgical History:  Procedure Laterality Date   COLONOSCOPY     MUSCLE RECESSION AND RESECTION Left 12/25/2018   Procedure: INFERIOR OBLIQUE RECESSION LEFT EYE, SUPERIOR OBLIQUE TUCK LEFT EYE;  Surgeon: Aura Camps, MD;  Location:  [facility];  Service: Ophthalmology;  Laterality: Left;   UPPER GASTROINTESTINAL ENDOSCOPY     Patient Active Problem List   Diagnosis Date Noted   Urinary frequency 05/31/2022   Bradycardia 03/02/2022   BPH (benign prostatic hyperplasia) 12/22/2021   Urinary incontinence, male, stress 12/22/2021   AKI (acute kidney injury) (HCC) 08/07/2021   Encounter for well adult exam with abnormal findings 06/22/2021   Low back pain 06/22/2021   Insomnia 08/03/2020   Cervical spinal stenosis 07/19/2020   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   History of colonic polyp    Hyperbilirubinemia    CKD (chronic kidney disease), stage III 11/21/2019   Acute metabolic encephalopathy 11/21/2019   History of COVID-19 11/21/2019   Thrombocytopenia 10/10/2019   Vitamin D deficiency 08/07/2019   Chronic constipation 09/16/2018   Erectile dysfunction 07/11/2017   Major depressive disorder 12/30/2013   Personal history of colonic polyps 12/30/2013   PTSD (post-traumatic stress disorder) 02/03/2013   Labile hypertension 02/17/2010    ONSET DATE: 08/08/2022 referral  REFERRING DIAG: G62.9 (ICD-10-CM) - Neuropathy  THERAPY DIAG:  Muscle weakness (generalized)  Other abnormalities of gait and mobility  Rationale for Evaluation and Treatment: Rehabilitation  SUBJECTIVE:  SUBJECTIVE STATEMENT: Patient denies acute changes or falls/near falls. Patient reports being able to try a few of his exercises but states that he needs to use it more. Patient SO reports that balance is looking better overall.   Pt accompanied by: significant other - [name]   PERTINENT HISTORY: labile hypertension, BPH, stage III CKD, cognitive deficits following cerebrovascular disease (2022), PTSD,  vitamin D deficiency, thrombocytopenia, hyperbilirubinemia, and chronic back pain (L3 S1 ) and hip pain  PAIN:  Are you having pain? No  PRECAUTIONS: Fall  PATIENT GOALS: "to fix the numbness in my leg"  OBJECTIVE:   DIAGNOSTIC FINDINGS: 05/27/20 brain MRI IMPRESSION: 1. No acute intracranial abnormality. 2. Moderate chronic microvascular ischemic change. 3. Moderate scattered microhemorrhages, which may reflect chronic hypertension or amyloid angiopathy. 4. The midbrain to pons ratio is approximately 0.16, which is decreased and borderline for progressive supranuclear palsy. Recommend clinical correlation. 5. Upper cervical spine degenerative change with suspected at least moderate canal stenosis at C3-C4. An MRI of the cervical spine could further characterize if clinically indicated.  TODAY'S TREATMENT:                                                                                                                               Vitals:   08/29/22 0806  BP: 102/85   NMR: SciFit: lvl 4.5 with goal of getting steps/min > 60 for 5 min LE only for neural priming  Obstacle Course: 4 x 6" hurdle step overs, increased error with step to pattern and improved with step through (CGA-minA) Foam pad marches without UE support in // bars 3 x 20 marches (requires SBA with mod verbal cues for foot placement) Foam pad lateral step on/off trialed x 10 steps each (even with max verbal cues and mirroring, patient unable to complete sequencing) TUG Normal with SBA and mod verbal cues 14"  TUG with horizontal head turns with SBA and mod verbal cues 20" 30" 32" TUG with horizontal head turns with SBA and mod verbal cues of where to turn  30" 25" 28"  Required 2x seated rest break for low back pain with prolonged standing activity.   PATIENT EDUCATION: Education details: Continue HEP Person educated: Patient and SO Education method: Explanation Education comprehension: verbalized  understanding and needs further education  HOME EXERCISE PROGRAM: Access Code: [email or web address] URL: [email or web address] Date: 08/22/2022 Prepared by: Merry Lofty  Exercises - Supine Bridge  - 1 x daily - 7 x weekly - 3 sets - 10 reps - Clamshell  - 1 x daily - 7 x weekly - 3 sets - 10 reps - Sit to Stand  - 1 x daily - 7 x weekly - 3 sets - 5-7 reps - Mini Squat with Counter Support  - 1 x daily - 7 x weekly - 3 sets - 10 reps - Heel Raises with Counter Support  - 1 x daily - 7 x weekly - 3 sets - 10 reps -  Standing March with Counter Support  - 1 x daily - 7 x weekly - 3 sets - 10 reps  GOALS: Goals reviewed with patient? Yes  SHORT TERM GOALS: = LTG based on PT POC LONG TERM GOALS: Target date: 09/15/22  Pt will be independent with final HEP for improved functional strength/endurance  Baseline: to be provided Goal status: INITIAL  2.  Pt will improve TUG to </= 10 secs to demonstrated reduced fall risk  Baseline: 12.25s  Goal status: INITIAL  3.  Pt will improve gait speed to >/= 1.50m/s to demonstrate improved community ambulation  Baseline: .70m/s Goal status: INITIAL  4.  Pt will improve 5x STS to </= 10 sec to demo improved functional LE strength and balance   Baseline: 13.32s R UE Goal status: INITIAL  5.  Pt will improve FGA to >/= 26/30 to demonstrate improved balance and reduced fall risk  Baseline: 24/30 Goal status: INITIAL  ASSESSMENT:  CLINICAL IMPRESSION: Session emphasized dynamic balance tasks on both firm and compliant surface. Patient required significant verbal, tactile, and visual cueing throughout session and demonstrates difficulty even following one step commands. Patient's cognitive deficits are likely primary driver of functional deficits. Continue POC.   OBJECTIVE IMPAIRMENTS: Abnormal gait, decreased cognition, decreased knowledge of condition, decreased strength, decreased safety awareness, impaired sensation, and postural  dysfunction.   ACTIVITY LIMITATIONS: carrying, lifting, bending, squatting, stairs, locomotion level, and caring for others  PARTICIPATION LIMITATIONS: meal prep, cleaning, laundry, interpersonal relationship, driving, shopping, community activity, and yard work  PERSONAL FACTORS: Age, Behavior pattern, Past/current experiences, Time since onset of injury/illness/exacerbation, Transportation, and 3+ comorbidities: see above  are also affecting patient's functional outcome.   REHAB POTENTIAL: Fair time since onset  CLINICAL DECISION MAKING: Stable/uncomplicated  EVALUATION COMPLEXITY: Low  PLAN:  PT FREQUENCY: 1x/week  PT DURATION: 4 weeks  PLANNED INTERVENTIONS: Therapeutic exercises, Therapeutic activity, Neuromuscular re-education, Balance training, Gait training, Patient/Family education, Self Care, Joint mobilization, Stair training, Vestibular training, Visual/preceptual remediation/compensation, Orthotic/Fit training, DME instructions, Aquatic Therapy, Manual therapy, and Re-evaluation  PLAN FOR NEXT SESSION: hip strengthening, balance, step over and LE clearance activities    Carmelia Bake, PT, DPT 08/29/2022, 9:00 AM

## 2022-09-05 ENCOUNTER — Ambulatory Visit: Payer: BC Managed Care – PPO

## 2022-09-05 DIAGNOSIS — R2689 Other abnormalities of gait and mobility: Secondary | ICD-10-CM

## 2022-09-05 DIAGNOSIS — M6281 Muscle weakness (generalized): Secondary | ICD-10-CM

## 2022-09-05 NOTE — Therapy (Signed)
OUTPATIENT PHYSICAL THERAPY NEURO TREATMENT   Patient Name: [name] MRN: [id number] DOB:01/02/1946, 77 y.o., male Today's Date: 09/05/2022   PCP: Oliver Barre, MD REFERRING PROVIDER: Marcos Eke, PA-C  END OF SESSION:  PT End of Session - 09/05/22 0745     Visit Number 4    Number of Visits 5    Date for PT Re-Evaluation 09/15/22    Authorization Type BCBS    PT Start Time 0800    PT Stop Time 0840    PT Time Calculation (min) 40 min    Equipment Utilized During Treatment Gait belt    Activity Tolerance Patient tolerated treatment well    Behavior During Therapy WFL for tasks assessed/performed;Flat affect             Past Medical History:  Diagnosis Date   Acute hypoxemic respiratory failure due to COVID-19 11/18/2019   Acute metabolic encephalopathy 11/21/2019   related to COVID-19 infection   Chronic constipation    CKD (chronic kidney disease)    stage III   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 Currently on nifedipine 30 mg daily. Serial BPs: -02/03/13: 118/82.     History of COVID-19 11/19/2019   Hyperbilirubinemia    Hypotension 11/18/2019   Hypoxia 11/21/2019   related to COVID-19 infection   Leukocytosis 10/10/2019   Major depressive disorder 12/30/2013   Personal history of colonic polyps 12/30/2013   Per colonoscopy at [facility] per pt, about sept 2014   PTSD (post-traumatic stress disorder)    saw combat during [place] war   Thrombocytopenia 10/10/2019   Tubular adenoma of colon 2014   Visual hallucinations 03/2020   Vitamin D deficiency 08/07/2019   Past Surgical History:  Procedure Laterality Date   COLONOSCOPY     MUSCLE RECESSION AND RESECTION Left 12/25/2018   Procedure: INFERIOR OBLIQUE RECESSION LEFT EYE, SUPERIOR OBLIQUE TUCK LEFT EYE;  Surgeon: Aura Camps, MD;  Location:  [facility];  Service: Ophthalmology;  Laterality: Left;   UPPER GASTROINTESTINAL ENDOSCOPY     Patient Active Problem List   Diagnosis Date Noted   Urinary frequency 05/31/2022   Bradycardia 03/02/2022   BPH (benign prostatic hyperplasia) 12/22/2021   Urinary incontinence, male, stress 12/22/2021   AKI (acute kidney injury) (HCC) 08/07/2021   Encounter for well adult exam with abnormal findings 06/22/2021   Low back pain 06/22/2021   Insomnia 08/03/2020   Cervical spinal stenosis 07/19/2020   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   History of colonic polyp    Hyperbilirubinemia    CKD (chronic kidney disease), stage III 11/21/2019   Acute metabolic encephalopathy 11/21/2019   History of COVID-19 11/21/2019   Thrombocytopenia 10/10/2019   Vitamin D deficiency 08/07/2019   Chronic constipation 09/16/2018   Erectile dysfunction 07/11/2017   Major depressive disorder 12/30/2013   Personal history of colonic polyps 12/30/2013   PTSD (post-traumatic stress disorder) 02/03/2013   Labile hypertension 02/17/2010    ONSET DATE: 08/08/2022 referral  REFERRING DIAG: G62.9 (ICD-10-CM) - Neuropathy  THERAPY DIAG:  Muscle weakness (generalized)  Other abnormalities of gait and mobility  Rationale for Evaluation and Treatment: Rehabilitation  SUBJECTIVE:  SUBJECTIVE STATEMENT: Patient reports doing well. Denies falls/near falls. Feels as though he continues to improve. L LE will still "heat up" at times.   Pt accompanied by: significant other - [name]   PERTINENT HISTORY: labile hypertension, BPH, stage III CKD, cognitive deficits following cerebrovascular disease (2022), PTSD, vitamin D deficiency, thrombocytopenia, hyperbilirubinemia, and chronic back pain  (L3 S1 ) and hip pain  PAIN:  Are you having pain? No  PRECAUTIONS: Fall  PATIENT GOALS: "to fix the numbness in my leg"  OBJECTIVE:   DIAGNOSTIC FINDINGS: 05/27/20 brain MRI IMPRESSION: 1. No acute intracranial abnormality. 2. Moderate chronic microvascular ischemic change. 3. Moderate scattered microhemorrhages, which may reflect chronic hypertension or amyloid angiopathy. 4. The midbrain to pons ratio is approximately 0.16, which is decreased and borderline for progressive supranuclear palsy. Recommend clinical correlation. 5. Upper cervical spine degenerative change with suspected at least moderate canal stenosis at C3-C4. An MRI of the cervical spine could further characterize if clinically indicated.  TODAY'S TREATMENT:                                                                                                                               There were no vitals filed for this visit.  NMR: SciFit: level 4.5 x10 mins B LE only for improved functional strengthening  Obstacle course: stepping over 4" hurdles x3 in // bars, standing on [employer] transferring x4 cones  Standing on [employer] batting balloon -> simple dual task  Difficulty finding way back to original mat without Max cuing  Rebounder: normal BOS  Difficulty with bimanual task and appropriate reactionary balance   PATIENT EDUCATION: Education details: Continue HEP, minimizing dual tasking Person educated: Patient and SO Education method: Explanation Education comprehension: verbalized understanding and needs further education  HOME EXERCISE PROGRAM: Access Code: [email or web address] URL: [email or web address] Date: 08/22/2022 Prepared by: Merry Lofty  Exercises - Supine Bridge  - 1 x daily - 7 x weekly - 3 sets - 10 reps - Clamshell  - 1 x daily - 7 x weekly - 3 sets - 10 reps - Sit to Stand  - 1 x daily - 7 x weekly - 3 sets - 5-7 reps - Mini Squat with Counter Support  - 1 x daily - 7 x weekly - 3  sets - 10 reps - Heel Raises with Counter Support  - 1 x daily - 7 x weekly - 3 sets - 10 reps - Standing March with Counter Support  - 1 x daily - 7 x weekly - 3 sets - 10 reps  GOALS: Goals reviewed with patient? Yes  SHORT TERM GOALS: = LTG based on PT POC LONG TERM GOALS: Target date: 09/15/22  Pt will be independent with final HEP for improved functional strength/endurance  Baseline: to be provided Goal status: INITIAL  2.  Pt will improve TUG to </= 10 secs to demonstrated reduced fall risk  Baseline: 12.25s  Goal status: INITIAL  3.  Pt will improve gait speed to >/= 1.58m/s to demonstrate improved community ambulation  Baseline: .57m/s Goal status: INITIAL  4.  Pt will improve 5x STS to </= 10 sec to demo improved functional LE strength and balance   Baseline: 13.32s R UE Goal status: INITIAL  5.  Pt will improve FGA to >/= 26/30 to demonstrate improved balance and reduced fall risk  Baseline: 24/30 Goal status: INITIAL  ASSESSMENT:  CLINICAL IMPRESSION: Patient seen for skilled PT session with emphasis on progressing balance tasks with simple dual task overlay. Patient fluctuating between prioritizing balance task vs cognitive task with sometimes breakdown in safety requiring CGA/MinA from PT to assist in balance strategies. Cognitive impairments continue to impact patients functional ability. Continue POC.   OBJECTIVE IMPAIRMENTS: Abnormal gait, decreased cognition, decreased knowledge of condition, decreased strength, decreased safety awareness, impaired sensation, and postural dysfunction.   ACTIVITY LIMITATIONS: carrying, lifting, bending, squatting, stairs, locomotion level, and caring for others  PARTICIPATION LIMITATIONS: meal prep, cleaning, laundry, interpersonal relationship, driving, shopping, community activity, and yard work  PERSONAL FACTORS: Age, Behavior pattern, Past/current experiences, Time since onset of injury/illness/exacerbation,  Transportation, and 3+ comorbidities: see above  are also affecting patient's functional outcome.   REHAB POTENTIAL: Fair time since onset  CLINICAL DECISION MAKING: Stable/uncomplicated  EVALUATION COMPLEXITY: Low  PLAN:  PT FREQUENCY: 1x/week  PT DURATION: 4 weeks  PLANNED INTERVENTIONS: Therapeutic exercises, Therapeutic activity, Neuromuscular re-education, Balance training, Gait training, Patient/Family education, Self Care, Joint mobilization, Stair training, Vestibular training, Visual/preceptual remediation/compensation, Orthotic/Fit training, DME instructions, Aquatic Therapy, Manual therapy, and Re-evaluation  PLAN FOR NEXT SESSION: check goals and dc   Westley Foots, PT, DPT, CBIS 09/05/2022, 8:43 AM

## 2022-09-12 ENCOUNTER — Encounter: Payer: Self-pay | Admitting: Physical Therapy

## 2022-09-12 ENCOUNTER — Ambulatory Visit: Payer: BC Managed Care – PPO | Admitting: Physical Therapy

## 2022-09-12 VITALS — BP 122/74 | HR 62

## 2022-09-12 DIAGNOSIS — M6281 Muscle weakness (generalized): Secondary | ICD-10-CM | POA: Diagnosis not present

## 2022-09-12 DIAGNOSIS — R2689 Other abnormalities of gait and mobility: Secondary | ICD-10-CM

## 2022-09-12 NOTE — Therapy (Signed)
OUTPATIENT PHYSICAL THERAPY NEURO TREATMENT / DISCHARGE   Patient Name: [name] MRN: [id number] DOB:07-11-1945, 77 y.o., male Today's Date: 09/12/2022   PCP: Oliver Barre, MD REFERRING PROVIDER: Marcos Eke, PA-C  END OF SESSION:  PT End of Session - 09/12/22 0805     Visit Number 5    Number of Visits 5    Date for PT Re-Evaluation 09/15/22    Authorization Type BCBS    PT Start Time 0804    PT Stop Time 0828    PT Time Calculation (min) 24 min    Equipment Utilized During Treatment Gait belt    Activity Tolerance Patient tolerated treatment well    Behavior During Therapy [facility] for tasks assessed/performed;Flat affect             Past Medical History:  Diagnosis Date   Acute hypoxemic respiratory failure due to COVID-19 11/18/2019   Acute metabolic encephalopathy 11/21/2019   related to COVID-19 infection   Chronic constipation    CKD (chronic kidney disease)    stage III   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 Currently on nifedipine 30 mg daily. Serial BPs: -02/03/13: 118/82.     History of COVID-19 11/19/2019   Hyperbilirubinemia    Hypotension 11/18/2019   Hypoxia 11/21/2019   related to COVID-19 infection   Leukocytosis 10/10/2019   Major depressive disorder 12/30/2013   Personal history of colonic polyps 12/30/2013   Per colonoscopy at [facility] per pt, about sept 2014   PTSD (post-traumatic stress disorder)    saw combat during [place] war   Thrombocytopenia 10/10/2019   Tubular adenoma of colon 2014   Visual hallucinations 03/2020   Vitamin D deficiency 08/07/2019   Past Surgical History:  Procedure Laterality Date   COLONOSCOPY     MUSCLE RECESSION AND RESECTION Left 12/25/2018   Procedure: INFERIOR OBLIQUE RECESSION LEFT EYE, SUPERIOR OBLIQUE TUCK LEFT EYE;  Surgeon: Aura Camps, MD;   Location: [facility];  Service: Ophthalmology;  Laterality: Left;   UPPER GASTROINTESTINAL ENDOSCOPY     Patient Active Problem List   Diagnosis Date Noted   Urinary frequency 05/31/2022   Bradycardia 03/02/2022   BPH (benign prostatic hyperplasia) 12/22/2021   Urinary incontinence, male, stress 12/22/2021   AKI (acute kidney injury) (HCC) 08/07/2021   Encounter for well adult exam with abnormal findings 06/22/2021   Low back pain 06/22/2021   Insomnia 08/03/2020   Cervical spinal stenosis 07/19/2020   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   History of colonic polyp    Hyperbilirubinemia    CKD (chronic kidney disease), stage III 11/21/2019   Acute metabolic encephalopathy 11/21/2019   History of COVID-19 11/21/2019   Thrombocytopenia 10/10/2019   Vitamin D deficiency 08/07/2019   Chronic constipation 09/16/2018   Erectile dysfunction 07/11/2017   Major depressive disorder 12/30/2013   Personal history of colonic polyps 12/30/2013   PTSD (post-traumatic stress disorder) 02/03/2013   Labile hypertension 02/17/2010    ONSET DATE: 08/08/2022 referral  REFERRING DIAG: G62.9 (ICD-10-CM) - Neuropathy  THERAPY DIAG:  Muscle weakness (generalized)  Other abnormalities of gait and mobility  Rationale for Evaluation and Treatment: Rehabilitation  SUBJECTIVE:  SUBJECTIVE STATEMENT: Denies acute changes/falls/near falls. Patient reports that he is doing well.   Pt accompanied by: significant other - [name]   PERTINENT HISTORY: labile hypertension, BPH, stage III CKD, cognitive deficits following cerebrovascular disease (2022), PTSD, vitamin D deficiency, thrombocytopenia, hyperbilirubinemia, and chronic back pain (L3 S1 ) and hip pain  PAIN:  Are you  having pain? No  PRECAUTIONS: Fall  PATIENT GOALS: "to fix the numbness in my leg"  OBJECTIVE:   DIAGNOSTIC FINDINGS: 05/27/20 brain MRI IMPRESSION: 1. No acute intracranial abnormality. 2. Moderate chronic microvascular ischemic change. 3. Moderate scattered microhemorrhages, which may reflect chronic hypertension or amyloid angiopathy. 4. The midbrain to pons ratio is approximately 0.16, which is decreased and borderline for progressive supranuclear palsy. Recommend clinical correlation. 5. Upper cervical spine degenerative change with suspected at least moderate canal stenosis at C3-C4. An MRI of the cervical spine could further characterize if clinically indicated.  TODAY'S TREATMENT:                                                                                                                               Vitals:   09/12/22 0808  BP: 122/74  Pulse: 62    TherAct (Assessment of LTGs):   [facility] PT Assessment - 09/12/22 0001       Standardized Balance Assessment   Five times sit to stand comments  10.49 sec   without UE use   10 Meter Walk 1.08 m/s   (no AD + SBA)     Timed Up and Go Test   Normal TUG (seconds) 10.83   sec (without AD + SBA) (mod verbal cues to recall task)     Functional Gait  Assessment   Gait assessed  Yes    Gait Level Surface Walks 20 ft in less than 7 sec but greater than 5.5 sec, uses assistive device, slower speed, mild gait deviations, or deviates 6-10 in outside of the 12 in walkway width.    Change in Gait Speed Able to change speed, demonstrates mild gait deviations, deviates 6-10 in outside of the 12 in walkway width, or no gait deviations, unable to achieve a major change in velocity, or uses a change in velocity, or uses an assistive device.    Gait with Horizontal Head Turns Performs head turns smoothly with slight change in gait velocity (eg, minor disruption to smooth gait path), deviates 6-10 in outside 12 in walkway width, or uses an  assistive device.    Gait with Vertical Head Turns Performs task with slight change in gait velocity (eg, minor disruption to smooth gait path), deviates 6 - 10 in outside 12 in walkway width or uses assistive device    Gait and Pivot Turn Pivot turns safely in greater than 3 sec and stops with no loss of balance, or pivot turns safely within 3 sec and stops with mild imbalance, requires small steps to catch balance.    Step Over Obstacle  Is able to step over one shoe box (4.5 in total height) but must slow down and adjust steps to clear box safely. May require verbal cueing.    Gait with Narrow Base of Support Ambulates 7-9 steps.    Gait with Eyes Closed Walks 20 ft, uses assistive device, slower speed, mild gait deviations, deviates 6-10 in outside 12 in walkway width. Ambulates 20 ft in less than 9 sec but greater than 7 sec.    Ambulating Backwards Walks 20 ft, uses assistive device, slower speed, mild gait deviations, deviates 6-10 in outside 12 in walkway width.    Steps Cannot do safely.    Total Score 17              PATIENT EDUCATION: Education details: Continue HEP  Person educated: Patient and SO Education method: Explanation Education comprehension: verbalized understanding and needs further education  HOME EXERCISE PROGRAM: Access Code: [email or web address] URL: [email or web address] Date: 08/22/2022 Prepared by: Merry Lofty  Exercises - Supine Bridge  - 1 x daily - 7 x weekly - 3 sets - 10 reps - Clamshell  - 1 x daily - 7 x weekly - 3 sets - 10 reps - Sit to Stand  - 1 x daily - 7 x weekly - 3 sets - 5-7 reps - Mini Squat with Counter Support  - 1 x daily - 7 x weekly - 3 sets - 10 reps - Heel Raises with Counter Support  - 1 x daily - 7 x weekly - 3 sets - 10 reps - Standing March with Counter Support  - 1 x daily - 7 x weekly - 3 sets - 10 reps  GOALS: Goals reviewed with patient? Yes  SHORT TERM GOALS: = LTG based on PT POC LONG TERM GOALS: Target date:  09/15/22  Pt will be independent with final HEP for improved functional strength/endurance  Baseline: Patient and caregiver report confidence in final HEP Goal status: MET  2.  Pt will improve TUG to </= 10 secs to demonstrated reduced fall risk  Baseline: 12.25s; 10.83 s Goal status: NOT MET  3.  Pt will improve gait speed to >/= 1.96m/s to demonstrate improved community ambulation  Baseline: .33m/s; improved to 1.08 m/s Goal status: MET  4.  Pt will improve 5x STS to </= 10 sec to demo improved functional LE strength and balance   Baseline: 13.32s R UE; 10.49 sec without UE support Goal status: NOT MET  5.  Pt will improve FGA to >/= 26/30 to demonstrate improved balance and reduced fall risk  Baseline: 24/30; regressed to 17/30 Goal status: NOT MET  ASSESSMENT:  CLINICAL IMPRESSION: Patient is discharging from skilled physical therapy services due to achieving maximal rehab potential at this time. Patient progress has been largely limited by cognitive deficits requiring mod verbal and max visual cues throughout session in order to safely complete testing noted above. Patient is still at an increased risk for falls as indicated by FGA scores. Patient did improve gait speed, TUG, and FxSTS; however, patient requires constant supervision for safety. Recommended continued work on HEP with caregiver supervision in order to maintain gains outside of therapy.   OBJECTIVE IMPAIRMENTS: Abnormal gait, decreased cognition, decreased knowledge of condition, decreased strength, decreased safety awareness, impaired sensation, and postural dysfunction.   ACTIVITY LIMITATIONS: carrying, lifting, bending, squatting, stairs, locomotion level, and caring for others  PARTICIPATION LIMITATIONS: meal prep, cleaning, laundry, interpersonal relationship, driving, shopping, community activity, and yard work  PERSONAL FACTORS: Age, Behavior pattern,  Past/current experiences, Time since onset of  injury/illness/exacerbation, Transportation, and 3+ comorbidities: see above  are also affecting patient's functional outcome.   REHAB POTENTIAL: Fair time since onset  CLINICAL DECISION MAKING: Stable/uncomplicated  EVALUATION COMPLEXITY: Low  PLAN:  PT FREQUENCY: 1x/week  PT DURATION: 4 weeks  PLANNED INTERVENTIONS: Therapeutic exercises, Therapeutic activity, Neuromuscular re-education, Balance training, Gait training, Patient/Family education, Self Care, Joint mobilization, Stair training, Vestibular training, Visual/preceptual remediation/compensation, Orthotic/Fit training, DME instructions, Aquatic Therapy, Manual therapy, and Re-evaluation  PLAN FOR NEXT SESSION: Not indicated as patient is discharging   Carmelia Bake, PT, DPT 09/12/2022, 8:46 AM   PHYSICAL THERAPY DISCHARGE SUMMARY  Visits from Start of Care: 5  Current functional level related to goals / functional outcomes: Achieved 2/5 LTGs, progress towards additional 2  Remaining deficits: Continued falls risk, caregiver aware of appropriate safety measures   Education / Equipment:  Continue HEP, return if notice significant decline  Patient agrees to discharge. Patient goals were partially met. Patient is being discharged due to maximized rehab potential.

## 2022-09-19 ENCOUNTER — Ambulatory Visit: Payer: BC Managed Care – PPO | Admitting: Internal Medicine

## 2022-09-19 VITALS — BP 134/90 | HR 58 | Temp 98.4°F | Ht 67.0 in | Wt 183.0 lb

## 2022-09-19 DIAGNOSIS — D696 Thrombocytopenia, unspecified: Secondary | ICD-10-CM

## 2022-09-19 DIAGNOSIS — R739 Hyperglycemia, unspecified: Secondary | ICD-10-CM

## 2022-09-19 DIAGNOSIS — N1831 Chronic kidney disease, stage 3a: Secondary | ICD-10-CM | POA: Diagnosis not present

## 2022-09-19 DIAGNOSIS — R0989 Other specified symptoms and signs involving the circulatory and respiratory systems: Secondary | ICD-10-CM

## 2022-09-19 DIAGNOSIS — E559 Vitamin D deficiency, unspecified: Secondary | ICD-10-CM | POA: Diagnosis not present

## 2022-09-19 LAB — URINALYSIS, ROUTINE W REFLEX MICROSCOPIC
Bilirubin Urine: NEGATIVE
Hgb urine dipstick: NEGATIVE
Ketones, ur: NEGATIVE
Leukocytes,Ua: NEGATIVE
Nitrite: NEGATIVE
RBC / HPF: NONE SEEN (ref 0–?)
Specific Gravity, Urine: 1.02 (ref 1.000–1.030)
Total Protein, Urine: NEGATIVE
Urine Glucose: NEGATIVE
Urobilinogen, UA: 0.2 (ref 0.0–1.0)
WBC, UA: NONE SEEN (ref 0–?)
pH: 6 (ref 5.0–8.0)

## 2022-09-19 LAB — LIPID PANEL
Cholesterol: 141 mg/dL (ref 0–200)
HDL: 64.9 mg/dL (ref >39.00)
LDL Cholesterol: 65 mg/dL (ref 0–99)
NonHDL: 75.94
Total CHOL/HDL Ratio: 2
Triglycerides: 56 mg/dL (ref 0.0–149.0)
VLDL: 11.2 mg/dL (ref 0.0–40.0)

## 2022-09-19 LAB — HEPATIC FUNCTION PANEL
ALT: 18 U/L (ref 0–53)
AST: 33 U/L (ref 0–37)
Albumin: 4.2 g/dL (ref 3.5–5.2)
Alkaline Phosphatase: 55 U/L (ref 39–117)
Bilirubin, Direct: 0.1 mg/dL (ref 0.0–0.3)
Total Bilirubin: 0.4 mg/dL (ref 0.2–1.2)
Total Protein: 7.5 g/dL (ref 6.0–8.3)

## 2022-09-19 LAB — CBC WITH DIFFERENTIAL/PLATELET
Basophils Absolute: 0 10*3/uL (ref 0.0–0.1)
Basophils Relative: 0.7 % (ref 0.0–3.0)
Eosinophils Absolute: 0 10*3/uL (ref 0.0–0.7)
Eosinophils Relative: 0.4 % (ref 0.0–5.0)
HCT: 39.3 % (ref 39.0–52.0)
Hemoglobin: 12.7 g/dL — ABNORMAL LOW (ref 13.0–17.0)
Lymphocytes Relative: 28.6 % (ref 12.0–46.0)
Lymphs Abs: 1.1 10*3/uL (ref 0.7–4.0)
MCHC: 32.3 g/dL (ref 30.0–36.0)
MCV: 90.5 fl (ref 78.0–100.0)
Monocytes Absolute: 0.3 10*3/uL (ref 0.1–1.0)
Monocytes Relative: 9 % (ref 3.0–12.0)
Neutro Abs: 2.3 10*3/uL (ref 1.4–7.7)
Neutrophils Relative %: 61.3 % (ref 43.0–77.0)
Platelets: 122 10*3/uL — ABNORMAL LOW (ref 150.0–400.0)
RBC: 4.34 Mil/uL (ref 4.22–5.81)
RDW: 13.9 % (ref 11.5–15.5)
WBC: 3.8 10*3/uL — ABNORMAL LOW (ref 4.0–10.5)

## 2022-09-19 LAB — BASIC METABOLIC PANEL
BUN: 14 mg/dL (ref 6–23)
CO2: 32 mEq/L (ref 19–32)
Calcium: 9.6 mg/dL (ref 8.4–10.5)
Chloride: 103 mEq/L (ref 96–112)
Creatinine, Ser: 1.44 mg/dL (ref 0.40–1.50)
GFR: 46.78 mL/min — ABNORMAL LOW (ref >60.00)
Glucose, Bld: 88 mg/dL (ref 70–99)
Potassium: 4.6 mEq/L (ref 3.5–5.1)
Sodium: 141 mEq/L (ref 135–145)

## 2022-09-19 LAB — HEMOGLOBIN A1C: Hgb A1c MFr Bld: 5.4 % (ref 4.6–6.5)

## 2022-09-19 LAB — TSH: TSH: 0.83 u[IU]/mL (ref 0.35–5.50)

## 2022-09-19 MED ORDER — HYDROCHLOROTHIAZIDE 12.5 MG PO CAPS: ORAL_CAPSULE | ORAL | 11 refills | Status: DC

## 2022-09-19 NOTE — Patient Instructions (Addendum)
Ok to take the HCT 12.5 mg per day, and even second one per day if the BP is still > 140;90  Please continue all other medications as before, and refills have been done if requested.  Please have the pharmacy call with any other refills you may need.  Please continue your efforts at being more active, low cholesterol diet, and weight control.  You are otherwise up to date with prevention measures today.  Please keep your appointments with your specialists as you may have planned  Please go to the LAB at the blood drawing area for the tests to be done  You will be contacted by phone if any changes need to be made immediately.  Otherwise, you will receive a letter about your results with an explanation, but please check with MyChart first.  Please remember to sign up for MyChart if you have not done so, as this will be important to you in the future with finding out test results, communicating by private email, and scheduling acute appointments online when needed.  Please make an Appointment to return in Sept 16, or sooner if needed

## 2022-09-19 NOTE — Progress Notes (Unsigned)
Patient ID: [name], male   DOB: [birth year], 77 y.o.   MRN: [id number]        Chief Complaint: follow up HTN, low vit d, low paltelets, ckd 3a       HPI:  [name] is a 77 y.o. male here overall doing ok, but wife with him checks his bp frequently and remarks again how labile it seems, this time more on the high side as his hct has been reduced to 12.5 mg   Occasionally BP will be > 150, o/w asympt for now reason she can determine.  Pt denies chest pain, increased sob or doe, wheezing, orthopnea, PND, increased LE swelling, palpitations, dizziness or syncope.   Pt denies polydipsia, polyuria, or new focal neuro s/s.    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 Currently on nifedipine 30 mg daily. Serial BPs: -02/03/13: 118/82.     History of COVID-19 11/19/2019   Hyperbilirubinemia    Hypotension 11/18/2019   Hypoxia 11/21/2019   related to COVID-19 infection   Leukocytosis 10/10/2019   Major depressive disorder 12/30/2013   Personal history of colonic polyps 12/30/2013   Per colonoscopy at [facility] per pt, about sept 2014   PTSD (post-traumatic stress disorder)    saw combat during [place] war   Thrombocytopenia 10/10/2019   Tubular adenoma of colon 2014    Visual hallucinations 03/2020   Vitamin D deficiency 08/07/2019   Past Surgical History:  Procedure Laterality Date   COLONOSCOPY     MUSCLE RECESSION AND RESECTION Left 12/25/2018   Procedure: INFERIOR OBLIQUE RECESSION LEFT EYE, SUPERIOR OBLIQUE TUCK LEFT EYE;  Surgeon: Aura Camps, MD;  Location: [facility];  Service: Ophthalmology;  Laterality: Left;   UPPER GASTROINTESTINAL ENDOSCOPY      reports that he has never smoked. He has never used smokeless tobacco. He reports that he does not drink alcohol and does not use drugs. family history includes Hypertension in an other family member. Allergies  Allergen Reactions   Lisinopril     Angioedema? Patient reports that his tongue and mouth became very swollen, was seen at [facility] for this in 2014.    Current Outpatient Medications on File Prior to Visit  Medication Sig Dispense Refill   Cholecalciferol (VITAMIN D3) 50 MCG (2000 UT) capsule Take 2,000 Units by mouth daily.     vitamin C (ASCORBIC ACID) 500 MG tablet Take 500 mg by mouth daily.     acetaminophen (TYLENOL) 325 MG tablet Take 2 tablets (650 mg total) by mouth every 6 (six) hours as needed for mild pain or headache (fever >/= 101). (Patient not taking: Reported on 06/29/2022)     albuterol (VENTOLIN HFA) 108 (90  Base) MCG/ACT inhaler Inhale 1-2 puffs into the lungs every 4 (four) hours as needed for wheezing or shortness of breath. (Patient not taking: Reported on 06/29/2022)     Artificial Tear Ointment (DRY EYES OP) Place 1 drop into both eyes daily as needed (dry eyes). (Patient not taking: Reported on 06/29/2022)     lactulose (CHRONULAC) 10 GM/15ML solution Take 30 mLs (20 g total) by mouth 2 (two) times daily as needed for moderate constipation or severe constipation. (Patient not taking: Reported on 06/29/2022) 236 mL 0   meloxicam (MOBIC) 15 MG tablet Take 0.5 tablets (7.5 mg total) by mouth daily. (Patient not taking: Reported on 06/29/2022) 7 tablet 0    Multiple Vitamins-Minerals (ONE-A-DAY MENS 50+) TABS Take 1 tablet by mouth daily. (Patient not taking: Reported on 06/29/2022)     QUEtiapine (SEROQUEL) 50 MG tablet Take 1 tablet (50 mg total) by mouth at bedtime. (Patient not taking: Reported on 09/19/2022) 90 tablet 1   tamsulosin (FLOMAX) 0.4 MG CAPS capsule Take 1 capsule (0.4 mg total) by mouth daily. (Patient not taking: Reported on 06/29/2022) 90 capsule 3   vitamin B-12 (CYANOCOBALAMIN) 1000 MCG tablet Take 1,000 mcg by mouth daily. (Patient not taking: Reported on 09/19/2022)     No current facility-administered medications on file prior to visit.        ROS:  All others reviewed and negative.  Objective        PE:  BP (!) 134/90 (BP Location: Left Arm, Patient Position: Sitting, Cuff Size: Normal)   Pulse (!) 58   Temp 98.4 F (36.9 C) (Oral)   Ht 5\' 7"  (1.702 m)   Wt 183 lb (83 kg)   SpO2 100%   BMI 28.66 kg/m                 Constitutional: Pt appears in NAD               HENT: Head: NCAT.                Right Ear: External ear normal.                 Left Ear: External ear normal.                Eyes: . Pupils are equal, round, and reactive to light. Conjunctivae and EOM are normal               Nose: without d/c or deformity               Neck: Neck supple. Gross normal ROM               Cardiovascular: Normal rate and regular rhythm.                 Pulmonary/Chest: Effort normal and breath sounds without rales or wheezing.                Abd:  Soft, NT, ND, + BS, no organomegaly               Neurological: Pt is alert. At baseline orientation, motor grossly intact               Skin: Skin is warm. No rashes, no other new lesions, LE edema - none               Psychiatric: Pt behavior is normal without agitation   Micro: none  Cardiac tracings I have personally  interpreted today:  none  Pertinent Radiological findings (summarize): none   Lab Results  Component Value Date   WBC 3.8 (L) 09/19/2022   HGB 12.7 (L)  09/19/2022   HCT 39.3 09/19/2022   PLT 122.0 (L) 09/19/2022   GLUCOSE 88 09/19/2022   CHOL 141 09/19/2022   TRIG 56.0 09/19/2022   HDL 64.90 09/19/2022   LDLCALC 65 09/19/2022   ALT 18 09/19/2022   AST 33 09/19/2022   NA 141 09/19/2022   K 4.6 09/19/2022   CL 103 09/19/2022   CREATININE 1.44 09/19/2022   BUN 14 09/19/2022   CO2 32 09/19/2022   TSH 0.83 09/19/2022   PSA 0.68 06/22/2021   HGBA1C 5.4 09/19/2022   Assessment/Plan:  Ezequel Capua is a 77 y.o. Black or African American [2] male with  has a past medical history of Acute hypoxemic respiratory failure due to COVID-19 (11/18/2019), Acute metabolic encephalopathy (11/21/2019), Chronic constipation, CKD (chronic kidney disease), Cognitive deficits following cerebrovascular disease (06/08/2020), Essential hypertension (02/17/2010), History of COVID-19 (11/19/2019), Hyperbilirubinemia, Hypotension (11/18/2019), Hypoxia (11/21/2019), Leukocytosis (10/10/2019), Major depressive disorder (12/30/2013), Personal history of colonic polyps (12/30/2013), PTSD (post-traumatic stress disorder), Thrombocytopenia (10/10/2019), Tubular adenoma of colon (2014), Visual hallucinations (03/2020), and Vitamin D deficiency (08/07/2019).  Labile hypertension BP Readings from Last 3 Encounters:  09/19/22 (!) 134/90  09/12/22 122/74  08/29/22 102/85   Remains labile this time to the upside,  pt to continue medical treatment hct 12.5 mg qd but for repeat later in the day for SBP > 150   Vitamin D deficiency Last vitamin D Lab Results  Component Value Date   VD25OH 37.89 05/04/2022   Low, to start oral replacement   Thrombocytopenia Lab Results  Component Value Date   PLT 122.0 (L) 09/19/2022   Stable overall, no overt bruising or bleeding,  to f/u any worsening symptoms or concerns   CKD (chronic kidney disease), stage III Lab Results  Component Value Date   CREATININE 1.44 09/19/2022   Stable overall, cont to avoid  nephrotoxins  Followup: Return in about 3 months (around 01/01/2023).  Oliver Barre, MD 09/21/2022 6:58 PM [facility]

## 2022-09-19 NOTE — Progress Notes (Signed)
The test results show that your current treatment is OK, as the tests are stable.  Please continue the same plan.  There is no other need for change of treatment or further evaluation based on these results, at this time.  thanks 

## 2022-09-21 ENCOUNTER — Encounter: Payer: Self-pay | Admitting: Internal Medicine

## 2022-09-21 NOTE — Assessment & Plan Note (Signed)
Last vitamin D Lab Results  Component Value Date   VD25OH 37.89 05/04/2022   Low, to start oral replacement  

## 2022-09-21 NOTE — Assessment & Plan Note (Signed)
Lab Results  Component Value Date   CREATININE 1.44 09/19/2022   Stable overall, cont to avoid nephrotoxins

## 2022-09-21 NOTE — Assessment & Plan Note (Signed)
BP Readings from Last 3 Encounters:  09/19/22 (!) 134/90  09/12/22 122/74  08/29/22 102/85   Remains labile this time to the upside,  pt to continue medical treatment hct 12.5 mg qd but for repeat later in the day for SBP > 150

## 2022-09-21 NOTE — Assessment & Plan Note (Signed)
Lab Results  Component Value Date   PLT 122.0 (L) 09/19/2022   Stable overall, no overt bruising or bleeding,  to f/u any worsening symptoms or concerns

## 2022-09-27 ENCOUNTER — Telehealth: Payer: Self-pay | Admitting: Internal Medicine

## 2022-09-27 MED ORDER — HYDROCHLOROTHIAZIDE 25 MG PO TABS: ORAL_TABLET | ORAL | 3 refills | Status: AC

## 2022-09-27 NOTE — Telephone Encounter (Signed)
See below

## 2022-09-27 NOTE — Telephone Encounter (Signed)
Patient would like to go back to the 12.5 tablets instead of the capsules..  Patient feels that the tablets work better.  The directions need to stay the same.12.5 twice a day. For a total of 25 mg.  Pharmacy:  [facility] on [address].  Patient would like some one to call her:  [phone]

## 2022-09-27 NOTE — Telephone Encounter (Signed)
Done erx 

## 2022-09-27 NOTE — Telephone Encounter (Signed)
Done erx  Hct does not come in 12,5 tab - so has to be 25 mg - 1/2 bid

## 2022-09-28 NOTE — Telephone Encounter (Signed)
Notified wife w/MD response../[email or web address] 

## 2022-11-30 ENCOUNTER — Encounter (INDEPENDENT_AMBULATORY_CARE_PROVIDER_SITE_OTHER): Payer: Self-pay

## 2022-12-19 ENCOUNTER — Encounter: Payer: Self-pay | Admitting: Pharmacist

## 2022-12-20 ENCOUNTER — Ambulatory Visit: Payer: BC Managed Care – PPO | Admitting: Internal Medicine

## 2022-12-20 ENCOUNTER — Encounter: Payer: Self-pay | Admitting: Internal Medicine

## 2022-12-20 VITALS — BP 132/84 | HR 60 | Temp 98.5°F | Ht 67.0 in | Wt 180.0 lb

## 2022-12-20 DIAGNOSIS — R0989 Other specified symptoms and signs involving the circulatory and respiratory systems: Secondary | ICD-10-CM | POA: Diagnosis not present

## 2022-12-20 DIAGNOSIS — E559 Vitamin D deficiency, unspecified: Secondary | ICD-10-CM

## 2022-12-20 DIAGNOSIS — N1831 Chronic kidney disease, stage 3a: Secondary | ICD-10-CM | POA: Diagnosis not present

## 2022-12-20 DIAGNOSIS — E538 Deficiency of other specified B group vitamins: Secondary | ICD-10-CM

## 2022-12-20 DIAGNOSIS — E7800 Pure hypercholesterolemia, unspecified: Secondary | ICD-10-CM

## 2022-12-20 DIAGNOSIS — D696 Thrombocytopenia, unspecified: Secondary | ICD-10-CM

## 2022-12-20 DIAGNOSIS — R739 Hyperglycemia, unspecified: Secondary | ICD-10-CM | POA: Diagnosis not present

## 2022-12-20 LAB — CBC WITH DIFFERENTIAL/PLATELET
Basophils Absolute: 0 10*3/uL (ref 0.0–0.1)
Basophils Relative: 0.7 % (ref 0.0–3.0)
Eosinophils Absolute: 0 10*3/uL (ref 0.0–0.7)
Eosinophils Relative: 0.6 % (ref 0.0–5.0)
HCT: 39.2 % (ref 39.0–52.0)
Hemoglobin: 12.7 g/dL — ABNORMAL LOW (ref 13.0–17.0)
Lymphocytes Relative: 35.1 % (ref 12.0–46.0)
Lymphs Abs: 1.1 10*3/uL (ref 0.7–4.0)
MCHC: 32.4 g/dL (ref 30.0–36.0)
MCV: 89.6 fl (ref 78.0–100.0)
Monocytes Absolute: 0.3 10*3/uL (ref 0.1–1.0)
Monocytes Relative: 9.7 % (ref 3.0–12.0)
Neutro Abs: 1.8 10*3/uL (ref 1.4–7.7)
Neutrophils Relative %: 53.9 % (ref 43.0–77.0)
Platelets: 123 10*3/uL — ABNORMAL LOW (ref 150.0–400.0)
RBC: 4.38 Mil/uL (ref 4.22–5.81)
RDW: 14.2 % (ref 11.5–15.5)
WBC: 3.3 10*3/uL — ABNORMAL LOW (ref 4.0–10.5)

## 2022-12-20 LAB — LIPID PANEL
Cholesterol: 135 mg/dL (ref 0–200)
HDL: 60.9 mg/dL (ref >39.00)
LDL Cholesterol: 62 mg/dL (ref 0–99)
NonHDL: 74.52
Total CHOL/HDL Ratio: 2
Triglycerides: 62 mg/dL (ref 0.0–149.0)
VLDL: 12.4 mg/dL (ref 0.0–40.0)

## 2022-12-20 LAB — BASIC METABOLIC PANEL
BUN: 10 mg/dL (ref 6–23)
CO2: 32 meq/L (ref 19–32)
Calcium: 9.6 mg/dL (ref 8.4–10.5)
Chloride: 102 meq/L (ref 96–112)
Creatinine, Ser: 1.28 mg/dL (ref 0.40–1.50)
GFR: 53.79 mL/min — ABNORMAL LOW (ref >60.00)
Glucose, Bld: 85 mg/dL (ref 70–99)
Potassium: 4.2 meq/L (ref 3.5–5.1)
Sodium: 140 meq/L (ref 135–145)

## 2022-12-20 LAB — VITAMIN B12: Vitamin B-12: 594 pg/mL (ref 211–911)

## 2022-12-20 LAB — HEPATIC FUNCTION PANEL
ALT: 18 U/L (ref 0–53)
AST: 23 U/L (ref 0–37)
Albumin: 4 g/dL (ref 3.5–5.2)
Alkaline Phosphatase: 55 U/L (ref 39–117)
Bilirubin, Direct: 0.2 mg/dL (ref 0.0–0.3)
Total Bilirubin: 0.8 mg/dL (ref 0.2–1.2)
Total Protein: 7.2 g/dL (ref 6.0–8.3)

## 2022-12-20 LAB — HEMOGLOBIN A1C: Hgb A1c MFr Bld: 5.5 % (ref 4.6–6.5)

## 2022-12-20 LAB — TSH: TSH: 1.23 u[IU]/mL (ref 0.35–5.50)

## 2022-12-20 LAB — VITAMIN D 25 HYDROXY (VIT D DEFICIENCY, FRACTURES): VITD: 57.13 ng/mL (ref 30.00–100.00)

## 2022-12-20 NOTE — Patient Instructions (Signed)

## 2022-12-20 NOTE — Progress Notes (Signed)
Patient ID: [name], male   DOB: 1945-09-02, 77 y.o.   MRN: [id number]        Chief Complaint: follow up HTN, HLD and DM, low, platelets       HPI:  [name] is a 77 y.o. male here with wife, overall doing ok.  Pt denies chest pain, increased sob or doe, wheezing, orthopnea, PND, increased LE swelling, palpitations, dizziness or syncope.   Pt denies polydipsia, polyuria, or new focal neuro s/s.    Pt denies fever, wt loss, night sweats, loss of appetite, or other constitutional symptoms  Has no bruising or bleeding, wife asks for plt check today.  She remains concerned about his BP and checks sometimes several times per day.  Remains labile, and recent attempt to improve control resulted in symptomatic low bp.         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 Currently on nifedipine 30 mg daily. Serial BPs: -02/03/13: 118/82.     History of COVID-19 11/19/2019   Hyperbilirubinemia    Hypotension 11/18/2019   Hypoxia 11/21/2019   related to COVID-19 infection   Leukocytosis 10/10/2019   Major depressive disorder 12/30/2013   Personal history of colonic polyps 12/30/2013   Per colonoscopy at [facility] per pt, about sept 2014   PTSD (post-traumatic stress disorder)    saw combat during [place] war   Thrombocytopenia 10/10/2019   Tubular adenoma of colon 2014    Visual hallucinations 03/2020   Vitamin D deficiency 08/07/2019   Past Surgical History:  Procedure Laterality Date   COLONOSCOPY     MUSCLE RECESSION AND RESECTION Left 12/25/2018   Procedure: INFERIOR OBLIQUE RECESSION LEFT EYE, SUPERIOR OBLIQUE TUCK LEFT EYE;  Surgeon: Aura Camps, MD;  Location: [facility];  Service: Ophthalmology;  Laterality: Left;   UPPER GASTROINTESTINAL ENDOSCOPY      reports that he has never smoked. He has never used smokeless tobacco. He reports that he does not drink alcohol and does not use drugs. family history includes Hypertension in an other family member. Allergies  Allergen Reactions   Lisinopril     Angioedema? Patient reports that his tongue and mouth became very swollen, was seen at [facility] for this in 2014.    Current Outpatient Medications on File Prior to Visit  Medication Sig Dispense Refill   hydrochlorothiazide (HYDRODIURIL) 25 MG tablet 1/2 tab by mouth twice per day 90 tablet 3   acetaminophen (TYLENOL) 325 MG tablet Take 2 tablets (650 mg total) by mouth every 6 (six) hours as needed for mild pain or headache (fever >/= 101). (Patient not taking: Reported on 06/29/2022)     albuterol (VENTOLIN HFA) 108 (90 Base) MCG/ACT inhaler Inhale 1-2 puffs into the lungs every 4 (four) hours as needed for wheezing or shortness of breath. (Patient not taking: Reported  on 06/29/2022)     Artificial Tear Ointment (DRY EYES OP) Place 1 drop into both eyes daily as needed (dry eyes). (Patient not taking: Reported on 06/29/2022)     Cholecalciferol (VITAMIN D3) 50 MCG (2000 UT) capsule Take 2,000 Units by mouth daily. (Patient not taking: Reported on 12/20/2022)     lactulose (CHRONULAC) 10 GM/15ML solution Take 30 mLs (20 g total) by mouth 2 (two) times daily as needed for moderate constipation or severe constipation. (Patient not taking: Reported on 06/29/2022) 236 mL 0   meloxicam (MOBIC) 15 MG tablet Take 0.5 tablets (7.5 mg total) by mouth daily.  (Patient not taking: Reported on 06/29/2022) 7 tablet 0   Multiple Vitamins-Minerals (ONE-A-DAY MENS 50+) TABS Take 1 tablet by mouth daily. (Patient not taking: Reported on 06/29/2022)     QUEtiapine (SEROQUEL) 50 MG tablet Take 1 tablet (50 mg total) by mouth at bedtime. (Patient not taking: Reported on 09/19/2022) 90 tablet 1   tamsulosin (FLOMAX) 0.4 MG CAPS capsule Take 1 capsule (0.4 mg total) by mouth daily. (Patient not taking: Reported on 06/29/2022) 90 capsule 3   vitamin B-12 (CYANOCOBALAMIN) 1000 MCG tablet Take 1,000 mcg by mouth daily. (Patient not taking: Reported on 09/19/2022)     vitamin C (ASCORBIC ACID) 500 MG tablet Take 500 mg by mouth daily. (Patient not taking: Reported on 12/20/2022)     No current facility-administered medications on file prior to visit.        ROS:  All others reviewed and negative.  Objective        PE:  BP 132/84 (BP Location: Left Arm, Patient Position: Sitting, Cuff Size: Normal)   Pulse 60   Temp 98.5 F (36.9 C) (Oral)   Ht 5\' 7"  (1.702 m)   Wt 180 lb (81.6 kg)   SpO2 91%   BMI 28.19 kg/m                 Constitutional: Pt appears in NAD               HENT: Head: NCAT.                Right Ear: External ear normal.                 Left Ear: External ear normal.                Eyes: . Pupils are equal, round, and reactive to light. Conjunctivae and EOM are normal               Nose: without d/c or deformity               Neck: Neck supple. Gross normal ROM               Cardiovascular: Normal rate and regular rhythm.                 Pulmonary/Chest: Effort normal and breath sounds without rales or wheezing.                Abd:  Soft, NT, ND, + BS, no organomegaly               Neurological: Pt is alert. At baseline orientation, motor grossly intact               Skin: Skin is warm. No rashes, no other new lesions, LE edema - none  Psychiatric: Pt behavior is normal without agitation   Micro: none  Cardiac tracings I have  personally interpreted today:  none  Pertinent Radiological findings (summarize): none   Lab Results  Component Value Date   WBC 3.3 (L) 12/20/2022   HGB 12.7 (L) 12/20/2022   HCT 39.2 12/20/2022   PLT 123.0 (L) 12/20/2022   GLUCOSE 85 12/20/2022   CHOL 135 12/20/2022   TRIG 62.0 12/20/2022   HDL 60.90 12/20/2022   LDLCALC 62 12/20/2022   ALT 18 12/20/2022   AST 23 12/20/2022   NA 140 12/20/2022   K 4.2 12/20/2022   CL 102 12/20/2022   CREATININE 1.28 12/20/2022   BUN 10 12/20/2022   CO2 32 12/20/2022   TSH 1.23 12/20/2022   PSA 0.68 06/22/2021   HGBA1C 5.5 12/20/2022   Assessment/Plan:  Kemo Finkel is a 77 y.o. Black or African American [2] male with  has a past medical history of Acute hypoxemic respiratory failure due to COVID-19 (11/18/2019), Acute metabolic encephalopathy (11/21/2019), Chronic constipation, CKD (chronic kidney disease), Cognitive deficits following cerebrovascular disease (06/08/2020), Essential hypertension (02/17/2010), History of COVID-19 (11/19/2019), Hyperbilirubinemia, Hypotension (11/18/2019), Hypoxia (11/21/2019), Leukocytosis (10/10/2019), Major depressive disorder (12/30/2013), Personal history of colonic polyps (12/30/2013), PTSD (post-traumatic stress disorder), Thrombocytopenia (10/10/2019), Tubular adenoma of colon (2014), Visual hallucinations (03/2020), and Vitamin D deficiency (08/07/2019).  Vitamin D deficiency Last vitamin D Lab Results  Component Value Date   VD25OH 57.13 12/20/2022   Stable, cont oral replacement   Thrombocytopenia Also for f/u cbc today, asympt, cont to follow  Labile hypertension BP Readings from Last 3 Encounters:  12/20/22 132/84  09/19/22 (!) 134/90  09/12/22 122/74   Stable, pt to continue medical treatment hct 12.5 qd   CKD (chronic kidney disease), stage III Lab Results  Component Value Date   CREATININE 1.28 12/20/2022   Stable overall, cont to avoid nephrotoxins  Followup: Return in  about 6 months (around 06/19/2023).  Oliver Barre, MD 12/23/2022 6:27 PM [facility]

## 2022-12-23 ENCOUNTER — Encounter: Payer: Self-pay | Admitting: Internal Medicine

## 2022-12-23 NOTE — Assessment & Plan Note (Signed)
Last vitamin D Lab Results  Component Value Date   VD25OH 57.13 12/20/2022   Stable, cont oral replacement

## 2022-12-23 NOTE — Assessment & Plan Note (Signed)
Lab Results  Component Value Date   CREATININE 1.28 12/20/2022   Stable overall, cont to avoid nephrotoxins

## 2022-12-23 NOTE — Assessment & Plan Note (Signed)
Also for f/u cbc today, asympt, cont to follow

## 2022-12-23 NOTE — Assessment & Plan Note (Signed)
BP Readings from Last 3 Encounters:  12/20/22 132/84  09/19/22 (!) 134/90  09/12/22 122/74   Stable, pt to continue medical treatment hct 12.5 qd

## 2023-01-01 ENCOUNTER — Ambulatory Visit: Payer: BC Managed Care – PPO | Admitting: Internal Medicine

## 2023-06-13 ENCOUNTER — Ambulatory Visit: Payer: 59 | Admitting: Internal Medicine

## 2023-06-19 ENCOUNTER — Ambulatory Visit: Payer: BC Managed Care – PPO | Admitting: Internal Medicine

## 2023-08-20 ENCOUNTER — Telehealth: Payer: Self-pay | Admitting: Internal Medicine

## 2023-08-20 NOTE — Telephone Encounter (Signed)
 Pt caregiver will have to go thru medical records.

## 2023-08-20 NOTE — Telephone Encounter (Signed)
 Copied from CRM [id number]. Topic: Medical Record Request - Records Request >> Aug 20, 2023  8:29 AM Allyne Areola wrote: Reason for CRM : Celestin Ike Malady patient's care giver is calling to request all of patient's medical records, she tried contacting medical records but has not been able to get through to them. Best call back number is [phone].  ---  Electronic records request submitted 5.5.25. TDW

## 2023-09-14 ENCOUNTER — Telehealth: Payer: Self-pay

## 2023-09-14 NOTE — Transitions of Care (Post Inpatient/ED Visit) (Signed)
   09/14/2023  Name: [name] MRN: [id number] DOB: Jan 29, 1946  Today's TOC FU Call Status: Today's TOC FU Call Status:: Successful TOC FU Call Completed TOC FU Call Complete Date: 09/13/23  Attempted to reach the patient regarding the most recent Inpatient/ED visit.  Follow Up Plan: No further outreach attempts will be made at this time. We have been unable to contact the patient. Patient transferred to inpatient PT at the [facility] [place], LPN [facility] Nurse Health Advisor Direct Dial [phone]

## 2023-09-28 NOTE — Discharge Summary (Signed)
 [employer] [facility]  Discharge Summary  PCP: Lynwood LELON Rush, MD Discharge Details   Admit date:         09/16/2023 Discharge date:       09/28/2023   Hospital LOS:  13 days  Active Hospital Problems   Diagnosis Date Noted POA  . Neurogenic orthostatic hypotension (*) 09/28/2023 Unknown  . Class 1 obesity due to excess calories with serious comorbidity and body mass index (BMI) of 30.0 to 30.9 in adult 09/17/2023 Not Applicable  . Cerebral amyloid angiopathy (*) 06/16/2023 Yes  . Urinary incontinence, male, stress 12/22/2021 Yes  . 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 Not Applicable  . CKD (chronic kidney disease), stage III (*) 11/21/2019 Yes  . Chronic constipation 09/16/2018 Yes  . Labile hypertension 02/17/2010 Yes    Resolved Hospital Problems   Diagnosis Date Noted Date Resolved POA  . *Syncope and collapse 09/16/2023 09/28/2023 Yes      Current Discharge Medication List     START taking these medications      Details  cyanocobalamin  1000 MCG tablet  Take one tablet (1,000 mcg dose) by mouth daily.   fludrocortisone 0.1 MG tablet Commonly known as: FLORINEF  Take one tablet (0.1 mg dose) by mouth daily.   hydrALAzine  HCl 10 mg tablet Commonly known as: APRESOLINE   Take one tablet (10 mg dose) by mouth at bedtime as needed (for SBP > 170.). Indication: High Blood Pressure   midodrine HCl 2.5 mg tablet Commonly known as: PROAMATINE  Take two tablets (5 mg dose) by mouth 30 (thirty) minutes before breakfast and 30 (thirty) minutes before lunch for 30 days. Indication: Blood Pressure Drop Upon Standing   pyridostigmine 60 MG tablet Commonly known as: MESTINON  Take one tablet (60 mg dose) by mouth 3 (three) times a day.   pyridoxine 50 MG tablet Commonly known as: B-6  Take one tablet (50 mg dose) by mouth daily. Indication: Peripheral Nerve Disease       CONTINUE  these medications which have NOT CHANGED      Details  acetaminophen  325 mg tablet Commonly known as: TYLENOL   Take two tablets (650 mg dose) by mouth every 4 (four) hours as needed for Pain.   melatonin 3 mg Tabs  Take one tablet (3 mg dose) by mouth at bedtime as needed. Indication: Trouble Sleeping   multivitamin tablet  Take one tablet by mouth every morning.   polyethylene glycol 17 GM/SCOOP powder Commonly known as: MIRALAX ,GAVILAX,CLEARLAX  Take seventeen g by mouth every morning.   sennosides-docusate sodium  8.6-50 mg per tablet Commonly known as: SENOKOT-S  Take one tablet by mouth 2 (two) times daily.      * You might also be taking other medications not listed above. If you have questions about any of your other medications, talk to the person who prescribed them or your Primary Care Provider.          STOP taking these medications    amLODIPine  besylate 2.5 mg tablet Commonly known as: NORVASC         Reason for medication changes: Due to orthostatic hypotension - add midodrine, florinef and off label mestinon - PRN hydralazine  for supine hypertnsion.   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   Hospital Course:        [name] is a 78 y.o. male with past medical history of hypertension  with orthostatic hypotension, previous lengthy hospitalization for COVID at [facility] in 2021 during which he developed some confusion that he never recovered from.  According to patient's brother [place], patient previously worked as a Pensions consultant at [employer] but has not been able to work since suffering from COVID although he is still technically employed by them.  Patient used to live with his girlfriend but 3 months ago was admitted at [facility] for pneumonia and the flu.  During this hospitalization brain MRI questioned cerebral amyloid angiopathy.   During this hospitalization patient did have workup for reversible causes of dementia including nonreactive RPR, elevated vitamin B12 level, normal TSH.  The patient was found to have elevated CK.  He was found to have labile hypertension.  He experienced transient thrombocytopenia with normal vitamin B12 and folate levels.  He was subsequently placed in a skilled nursing facility in [place] for about 3 months and then 4 days ago was admitted at the [facility] facility in [place].    According to brother patient does have history of orthostatic hypotension resulting in syncopal episodes. This morning while at the [facility] facility in [place] the patient had some incontinence and was placed on a commode.  While on the commode he passed out and subsequently appeared to be nonverbal.  Blood pressure during this episode was reported to be 202/110 and blood sugar was 130.      Very brief summary, he was found to have profound orthostatic hypotension. He was given IVF without much improvement and not related to volume depletion. He was subsequently titrated on various agents with much difficulty finding a regimen to prevent supine hypotension and profound orthostatic hypotension. We suspect neurogenic process effecting his autoregulation and do not feel this is curable. He will continue to endorse episodes of orthostasis and require monitoring of his BP with prn holding of his midodrine if hypertensive/ Prn hydralazine  nightly for Hypertension. We have had rather decent control with midodrine use in morning and noon at 7.5-10mg  and will recommend Prn hydralazine  at night if SBP >170. He will require WC for longer distances and be restricted to short distances with ambulation. Have discussed with his HCPOA. Given the effect of QOL, palliative care should be consulted. We will defer here given [facility] association. SNF is not felt to be beneficial givne his limitations and we will plan to discharge back to his current  facility   Hospital course by problem: # neurogenic Orthostatic hypotension  Labile blood pressure  # Transient loss of consciousness due to Orthostatic hypotension # HTN Urgency, resolve.d  Transient episode of loss of consciousness, due to orthostatic syncope although blood pressure during or immediately after episode reported to be significantly elevated. - he presented on amlodipine  2.5mg . we will discontinue this.  - as above, fluid boluses did not resolve his orthostatics. Cortisol was checked at random and present. He is without na/K derangement with consideration to adrenal insufficiency.   -- will discharge on midodrine 5mg  Am and lunch time to cover most likely times of activity -- PRN hydralazine  nightly for SBP > 170's.  - he is of high risk for orthostatic syncope and this will not be curable. Recurrent hospitalizations would be unlikely to fix this and would recommend GOC discussions with palliative/ hospice referral.    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 Had microhemorrhages in 2022, which progressed significantly on his MRI 09/16/23. He did experience some increased confusion overnight. Treated with seroquel . A repeat stat CTH was done without acute concerns. Suspect an element of hospital related delirium. Okay for discharge.  Avoid asa Supportive care Neurology followup as OP     # Chronic debility -Return to LTC when BP/orthostasis improved (had just been admitted 4 days prior to this admission at [facility] facility in [place])   # Urinary incontinence # BPH -avoid alpha blockers, would recommend Foley if retention is encountered.    # Overweight, Body mass index  is 30.73 kg/m. -Affects all facets of care.   -Encourage healthy and adequate dietary intake along with exercising as able to achieve/maintain a healthy weight.   Recommendations to physicians/followup needed:  - neurology for CAA - ongoing BP monitoring and titration of midodrine. May need sliding scale. Type alterations in future.  --- could consider discontinuation of  mestinon as its unclear how much benefit this is offering.  - palliative/hospice care referral upon admission to facility to assist with GOC.    Physical Exam: BP (!) 156/105 (BP Location: Left Upper Arm, Patient Position: Lying)   Pulse 63   Temp 97.6 F (36.4 C) (Oral)   Resp 18   Ht 1.702 m (5' 7)   Wt 89 kg (196 lb 3.4 oz)   SpO2 100%   BMI 30.73 kg/m   Constitutional - elderly male, with obvious decline in health, resting comfortably, no acute distress Eyes - pupils equal round and reactive to light and accomodation Nose - no gross deformity or drainage CV - (+)S1S2, no murmur, no JVD    Resp - CTA bilaterally, no wheezing or crackles;  GI - (+)BS, soft, non-tender, non-distended Extremities- ROM normal.  No peripheral edema Skin - no rashes or wounds Neuro - alert, oriented to self. He has decreased ST memory, speech is slow and soft. He is ortiented to situation but with gaps in his memory. He is moving all extremities, albeit weak and slow.  Psych - flat facial features, calm and appropriate given situation.    Labs on Discharge:  Recent Labs    Units 09/25/23 0703 09/23/23 0052  WBC thou/mcL 4.4 5.5  HGB gm/dL 87.4* 86.8*  HCT % 61.2* 41.0  PLT thou/mcL 129* 142*   Recent Labs    Units 09/25/23 0615 09/23/23 0052  NA mmol/L 138 139  K mmol/L 4.2 4.1  CL mmol/L 103 103  CO2 mmol/L 26 26  BUN mg/dL 15 17  CREATININE mg/dL 8.99 8.88  CALCIUM mg/dL 9.5 9.4   No results for input(s): BILITOT, AST, ALT, ALKPHOS, PROT, ALBUMIN, LDH, URICACID in the last 168  hours.  Recent Labs    Units 09/22/23 0859  TSH uIU/mL 1.560  HGBA1C % 5.5   No results for input(s): LABPROT, INR, PTT in the last 168 hours.  No results for input(s): CHOL, LDL, HDL, TRIG in the last 168 hours. No results for input(s): TROPONIN, CK in the last 168 hours.  Invalid input(s): CK-MB   Diagnostics:  CT Head WO Contrast  Final Result  IMPRESSION:  1.  No acute intracranial abnormality.        Electronically Signed by: Dallas Jubilee, MD on 09/28/2023 12:11 PM    CT Head WO Contrast  Final  Result  IMPRESSION:  No definite acute intracranial abnormality appreciated.    Electronically Signed by: Elsie Dayhoff, MD on 09/23/2023 12:28 PM    EEG Awake and Drowsy  Final Result  Trong Gosling  DOB:  11/15/1945  DATE OF SERVICE:  09/22/2023   REFERRING PROVIDER: Franky Ice, DO  [address] [place],  [place] [address]    HISTORY:  This is an 78 y.o. male with a history of seizure.    Reason for exam:  Establishing the first diagnosis of a seizure disorder.    This is a 19 channel EEG with one channel dedicated to a limited EKG lead.    Electrodes were placed in accordance with the International 10-20 system.       The background consisted of a 5-7 Hz., 20-30 uV posteriorly dominant   rhythm that was symmetric over the hemispheres.    Although drowsiness was seen, there was no evidence of stage II sleep on   this recording.    Photic stimulation was normal.            Hyperventilation was omitted.            There were no focal abnormalities, epileptiform changes, or seizures   identified.     Interpretation:  This adult awake and drowsy EEG is abnormal due to:  Diffuse background slowing    Clinical correlation:  This EEG is suggestive of a mild to moderate non-specific encephalopathy.    These etiologies could include, yet are not limited to, metabolic,   infectious, toxic, or post-ictal causes.   Clinical  correlation   recommended.     Selinda Hahn, MD       Echocardiogram Complete WO Enhancing Agent  Final Result  Left Ventricle: EF: 60-65%.  .  Right Ventricle: Normal systolic excursion velocity by TDI (>9.5 cm/s).  .  Aortic Valve: Mild to moderate aortic valve regurgitation.      MRI Head WO W Contrast  Final Result  IMPRESSION: 1.  No acute intracranial process is seen. 2. Mild cerebral atrophy and periventricular white matter changes are seen. 3. There are scattered punctate areas of susceptibility artifact seen throughout the subcortical white matter. 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    Electronically Signed by: Rock Croft, MD on 09/16/2023 5:53 PM    EEG Awake and Drowsy  Final Result      History : Mr. [name] is a 78 y.o. male referred for AMS. EEG   ordered to look for evidence of seizures and/or interictal discharges.     Current Medications : MAR reviewed          Procedure : This is a routine 21 channel adult electroencephalogram with   one channel dedicated to limited EKG recording. Various montages were used   in the acquisition of the EEG. No activating procedures were performed    State : This study was obtained with the patient in the awake and drowsy   state.          EEG Description :  The posterior dominant rhythm consisted of a frequency   of 7-8 Hz with an amplitude ranging between 20-30 V.  It was found to be   symmetric across the bilateral posterior derivations and attenuated with   eye opening.  The remaining background consisted of alpha and theta that   was somewhat organized, continuous, symmetric, and reactive.  Photic stimulation and hyperventilation were not performed.     Drowsiness was evidenced by decreased muscle artifact.   No stage N2 sleep   architecture was attained.    There were no focal abnormalities, epileptiform  abnormalities, or   electrographic seizures.          EEG Interpretation : This is abnormal awake and drowsy EEG due to   generalized slowing of the background.    No seizures.    Clinical Correlation : Findings indicate:  Mild degree of encephalopathy (diffuse cerebral dysfunction) of   nonspecific etiology.     Medford Blumenthal, MD  09/16/2023, 11:46 AM      CT Head WO Contrast  Final Result  IMPRESSION:  No acute intracranial abnormality.    Electronically Signed by: Marinda Fleming, MD on 09/16/2023 6:56 AM    XR Chest Ap Portable  Final Result  IMPRESSION:      No acute pulmonary disease.    Electronically Signed by: Marinda Fleming, MD on 09/16/2023 3:44 AM       Post Hospital Care   Discharge Procedure Orders  Ambulatory referral to Neurology  Referral Priority: Routine Referral Type: Consultation  Referral Reason: Evaluate and Return  Number of Visits Requested: 1 Expiration Date: 03/19/24   Follow-up with Primary Care Physician  Referral Priority: Urgent Referral Type: Consultation  Referral Reason: Transfer of Care for Problem  Number of Visits Requested: 1 Expiration Date: 03/25/24   Regular Diet   Discharge instructions  Order Comments: You presented to the hospital due to passing out. We suspect you have dysfunction in the nervous system that controls blood pressure and heart rate regulation. This is called autonomic dysfunction. We suspect this is related to your Cerebral Amalyoid Angiopathy, and unfortunatly there is not a cure for this. Our goal is to prevent your blood pressures at rest from beeing Excessivly high (>170) and your blood pressures from being TOO low when standing that would cause you to pass out.  - We are going to recommend taking your time between position changes. Would avoid walking more than a few steps. For longer distances will recommend you use a wheel chair. You will need to wear compression stockings when you are planning to get up.  Abdominal binder may also be helpful as these modalities help keep blood flowing to the brain when we stand do offset gravity.  - we have you on a medication called Midodrine whichhelps compress blood vessels- in your case we are limited in how much we can use as it also causes you hypertension while laying. For this reason we will dose it in the morning and lunch time when you are most likely to be active. We will hold night time doses. - at night we will recommend as needed anti-hypertensive hydralazine  if your blood pressures is > .  - florinef will help you retain water to keep the blood vessels filled.  - good nutrition will be important as well as proteins in the blood vessels will keep fluids from leaking into tissue.  - The mestinon we are using off label as it has shown some benefit in helping blood pressures dropping when standing.  - lastly we will send you on some B-6 as this can help with nerve function.  Overall, I am concerned over the longevity of your health with these new findings. I do recommend consideration to palliative care as an added layer of support and assist with ongoing Goals of care as we will  be very limited on additonal therapies for you. I fear this will cause significant decrease in your quality of life.   Activity as tolerated  Order Comments: Recommend avoiding walking more than a few feet. Take your time with position changes. Recommend Wheel chair for getting around longer distances.   Notify Physician and Call 911 if you experience any of the following: Sudden numbness or weakness, sudden trouble speaking, vision problems, trouble walking, sudden severe headache with no known cause.   Notify Physician and CALL 911 if you experience Chest Pain or discomfort, especially if associated with tiredness, sick on stomach or sweaty feeling.   Notify Physician for confusion or disorientation     Potential for Rehab:        Poor Code Status:   No  CPR Disposition: Assisted Living facility Consults:  Neurology   Followup appointments: No future appointments.   Time spent in discharge process:  total time spent 60 minutes   Electronically signed: Debby CHRISTELLA Lessen, NP 09/28/2023 / 4:09 PM   *Some images could not be shown.

## 2023-10-01 ENCOUNTER — Telehealth: Payer: Self-pay

## 2023-10-01 NOTE — Transitions of Care (Post Inpatient/ED Visit) (Signed)
 10/01/2023  Name: [name] MRN: [id number] DOB: 1945-08-05  Today's TOC FU Call Status: Today's TOC FU Call Status:: Successful TOC FU Call Completed TOC FU Call Complete Date: 10/01/23 Patient's Name and Date of Birth confirmed.  Transition Care Management Follow-up Telephone Call Date of Discharge: 09/28/23 Discharge Facility: Other [facility]) Name of Other ([facility]) Discharge Facility: [facility] Type of Discharge: Inpatient Admission Primary Inpatient Discharge Diagnosis:: encephalopathy How have you been since you were released from the hospital?: Better Any questions or concerns?: No  Items Reviewed: Did you receive and understand the discharge instructions provided?: Yes Medications obtained,verified, and reconciled?: Yes (Medications Reviewed) Any new allergies since your discharge?: No Dietary orders reviewed?: Yes Do you have support at home?: Yes People in Home [RPT]: spouse  Medications Reviewed Today: Medications Reviewed Today     Reviewed by Darrall Ellison, LPN (Licensed Practical Nurse) on 10/01/23 at 1223  Med List Status: <None>   Medication Order Taking? Sig Documenting Provider Last Dose Status Informant  acetaminophen  (TYLENOL ) 325 MG tablet [id number]  Take 2 tablets (650 mg total) by mouth every 6 (six) hours as needed for mild pain or headache (fever >/= 101).  Patient not taking: Reported on 06/29/2022   Michela Aguas, MD  Active Self  albuterol  (VENTOLIN  HFA) 108 (90 Base) MCG/ACT inhaler [id number]  Inhale 1-2 puffs into the lungs every 4 (four) hours as needed for wheezing or shortness of breath.  Patient not taking: Reported on 06/29/2022   Michela Aguas, MD  Active Self  amLODipine  (NORVASC ) 2.5 MG tablet [id number] Yes Take 2.5 mg by mouth. [provider]  Active   Artificial Tear Ointment (DRY EYES OP) [id number]  Place 1 drop into both eyes daily as needed (dry eyes).  Patient not taking: Reported on 06/29/2022    [provider]  Active Self  Cholecalciferol (VITAMIN D3) 50 MCG (2000 UT) capsule [id number]  Take 2,000 Units by mouth daily.  Patient not taking: Reported on 12/20/2022   [provider]  Active Self  hydrochlorothiazide  (HYDRODIURIL ) 25 MG tablet [id number]  1/2 tab by mouth twice per day Roslyn Coombe, MD  Active   lactulose  (CHRONULAC ) 10 GM/15ML solution [id number]  Take 30 mLs (20 g total) by mouth 2 (two) times daily as needed for moderate constipation or severe constipation.  Patient not taking: Reported on 06/29/2022   Michela Aguas, MD  Active Self  meloxicam  (MOBIC ) 15 MG tablet [id number]  Take 0.5 tablets (7.5 mg total) by mouth daily.  Patient not taking: Reported on 06/29/2022   Ulysees Gander, DO  Active Self  Multiple Vitamins-Minerals (ONE-A-DAY MENS 50+) TABS [id number]  Take 1 tablet by mouth daily.  Patient not taking: Reported on 06/29/2022   [provider]  Active Self  QUEtiapine  (SEROQUEL ) 50 MG tablet [id number]  Take 1 tablet (50 mg total) by mouth at bedtime.  Patient not taking: Reported on 09/19/2022   Roslyn Coombe, MD  Active Self  tamsulosin  (FLOMAX ) 0.4 MG CAPS capsule [id number]  Take 1 capsule (0.4 mg total) by mouth daily.  Patient not taking: Reported on 06/29/2022   Roslyn Coombe, MD  Active   vitamin B-12 (CYANOCOBALAMIN ) 1000 MCG tablet [id number]  Take 1,000 mcg by mouth daily.  Patient not taking: Reported on 09/19/2022   [provider]  Active Self  vitamin C (ASCORBIC ACID ) 500 MG tablet [id number]  Take 500 mg by mouth daily.  Patient not taking: Reported on 12/20/2022   [provider]  Active Self            Home Care and Equipment/Supplies: Were Home Health Services Ordered?: NA Any new equipment or medical supplies ordered?: NA  Functional Questionnaire: Do you need assistance with bathing/showering or dressing?: No Do you need assistance with meal preparation?: No Do you need assistance with  eating?: No Do you have difficulty maintaining continence: No Do you need assistance with getting out of bed/getting out of a chair/moving?: No Do you have difficulty managing or taking your medications?: No  Follow up appointments reviewed: PCP Follow-up appointment confirmed?: No MD Provider Line Number:[phone] Given: No Specialist Hospital Follow-up appointment confirmed?: No Reason Specialist Follow-Up Not Confirmed: Patient has Specialist Provider Number and will Call for Appointment Do you need transportation to your follow-up appointment?: No Do you understand care options if your condition(s) worsen?: Yes-patient verbalized understanding  Patient is at the [facility] today. They are going to schedule his appt with specialist  SIGNATURE Darrall Ellison, LPN [facility] Nurse Health Advisor Direct Dial [phone]

## 2023-10-01 NOTE — Progress Notes (Signed)
 CARE COORDINATOR CONTACT WITH PATIENT/CAREGIVER AFTER HOSPITAL DISCHARGE   Patient contacted.  Encouraged to schedule Hospital follow up appointment with their Non NH PCP.  Attempted to contact patient x2. Unable to reach. Left voicemail.

## 2023-12-05 ENCOUNTER — Ambulatory Visit: Payer: Self-pay | Admitting: Internal Medicine

## 2023-12-05 ENCOUNTER — Ambulatory Visit: Admitting: Internal Medicine

## 2023-12-05 ENCOUNTER — Encounter: Payer: Self-pay | Admitting: Internal Medicine

## 2023-12-05 VITALS — BP 174/102 | HR 60 | Temp 98.0°F | Ht 71.0 in | Wt 196.0 lb

## 2023-12-05 DIAGNOSIS — F5101 Primary insomnia: Secondary | ICD-10-CM | POA: Diagnosis not present

## 2023-12-05 DIAGNOSIS — Z0001 Encounter for general adult medical examination with abnormal findings: Secondary | ICD-10-CM

## 2023-12-05 DIAGNOSIS — R0989 Other specified symptoms and signs involving the circulatory and respiratory systems: Secondary | ICD-10-CM | POA: Diagnosis not present

## 2023-12-05 DIAGNOSIS — R269 Unspecified abnormalities of gait and mobility: Secondary | ICD-10-CM

## 2023-12-05 DIAGNOSIS — E559 Vitamin D deficiency, unspecified: Secondary | ICD-10-CM

## 2023-12-05 DIAGNOSIS — Z0000 Encounter for general adult medical examination without abnormal findings: Secondary | ICD-10-CM

## 2023-12-05 DIAGNOSIS — Z1211 Encounter for screening for malignant neoplasm of colon: Secondary | ICD-10-CM | POA: Diagnosis not present

## 2023-12-05 DIAGNOSIS — D696 Thrombocytopenia, unspecified: Secondary | ICD-10-CM

## 2023-12-05 DIAGNOSIS — R350 Frequency of micturition: Secondary | ICD-10-CM

## 2023-12-05 DIAGNOSIS — N1831 Chronic kidney disease, stage 3a: Secondary | ICD-10-CM

## 2023-12-05 LAB — URINALYSIS, ROUTINE W REFLEX MICROSCOPIC
Bilirubin Urine: NEGATIVE
Hgb urine dipstick: NEGATIVE
Ketones, ur: NEGATIVE
Nitrite: NEGATIVE
Specific Gravity, Urine: 1.01 (ref 1.000–1.030)
Total Protein, Urine: NEGATIVE
Urine Glucose: NEGATIVE
Urobilinogen, UA: 0.2 (ref 0.0–1.0)
pH: 7.5 (ref 5.0–8.0)

## 2023-12-05 MED ORDER — CEPHALEXIN 500 MG PO CAPS: 500.0000 mg | ORAL_CAPSULE | Freq: Three times a day (TID) | ORAL | 0 refills | Status: AC

## 2023-12-05 NOTE — Assessment & Plan Note (Signed)
 Also for referral PT

## 2023-12-05 NOTE — Assessment & Plan Note (Signed)
 Age and sex appropriate education and counseling updated with regular exercise and diet Referrals for preventative services - for colonoscopy Immunizations addressed - for flu shot, shingrix , prevnar 20 at pharmacy Smoking counseling  - none needed Evidence for depression or other mood disorder - none significant Most recent labs reviewed. I have personally reviewed and have noted: 1) the patient's medical and social history 2) The patient's current medications and supplements 3) The patient's height, weight, and BMI have been recorded in the chart

## 2023-12-05 NOTE — Assessment & Plan Note (Signed)
 Chronic mild stable, no overt bleeding

## 2023-12-05 NOTE — Assessment & Plan Note (Signed)
 With recent mild worsening, for otc melatonin up to 10 mg at bedtime prn

## 2023-12-05 NOTE — Progress Notes (Signed)
 Patient ID: [name], male   DOB: 1946/02/23, 78 y.o.   MRN: [id number]         Chief Complaint:: wellness exam and labile htn, abnormal UA with lower abd pressure, gait disorder, insomnia       HPI:  [name] is a 78 y.o. male here for wellness exam with wife for support; due for colonoscopy,for flu shot and shingrix and prevnar 20 at pharmacy per pt preference, o/w up to date                        Also has records with abnormal UA with elevated wbc at [facility] on Monday, now with lower abd pressure, Denies urinary symptoms such as frequency, urgency, flank pain, hematuria or n/v, fever, chills.  Pt denies chest pain, increased sob or doe, wheezing, orthopnea, PND, increased LE swelling, palpitations, dizziness or syncope.   Pt denies polydipsia, polyuria, or new focal neuro s/s, though does have general weakness and off balance recently BP has been labile and even low at times, pt does not want change today.  Also c/o worsening insomnia most nights the past several weeks.     Wt Readings from Last 3 Encounters:  12/05/23 196 lb (88.9 kg)  12/20/22 180 lb (81.6 kg)  09/19/22 183 lb (83 kg)   BP Readings from Last 3 Encounters:  12/05/23 (!) 174/102  12/20/22 132/84  09/19/22 (!) 134/90   Immunization History  Administered Date(s) Administered   Pneumococcal-Unspecified 12/07/2009   Health Maintenance Due  Topic Date Due   Colonoscopy  10/17/2023   INFLUENZA VACCINE  11/16/2023      Past Medical History:  Diagnosis Date   Acute hypoxemic respiratory failure due to COVID-19 11/18/2019   Acute metabolic encephalopathy 11/21/2019   related to COVID-19 infection   Chronic constipation    CKD (chronic kidney disease)    stage III   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 Currently on nifedipine  30 mg daily. Serial BPs:  -02/03/13: 118/82.     History of COVID-19 11/19/2019   Hyperbilirubinemia    Hypotension 11/18/2019   Hypoxia 11/21/2019   related to COVID-19 infection   Leukocytosis 10/10/2019   Major depressive disorder 12/30/2013   Personal history of colonic polyps 12/30/2013   Per colonoscopy at [facility] per pt, about sept 2014   PTSD (post-traumatic stress disorder)    saw combat during [place] war   Thrombocytopenia 10/10/2019   Tubular adenoma of colon 2014   Visual hallucinations 03/2020   Vitamin D  deficiency 08/07/2019   Past Surgical History:  Procedure Laterality Date   COLONOSCOPY     MUSCLE RECESSION AND RESECTION Left 12/25/2018   Procedure: INFERIOR OBLIQUE RECESSION LEFT EYE, SUPERIOR OBLIQUE TUCK LEFT EYE;  Surgeon: Jacques Sharper, MD;  [facility];  Service: Ophthalmology;  Laterality: Left;   UPPER GASTROINTESTINAL ENDOSCOPY      reports that he has never smoked. He has never used smokeless tobacco. He reports that he does not drink alcohol and does not use drugs. family history includes Hypertension in an other family member. Allergies  Allergen Reactions   Lisinopril      Angioedema? Patient reports that his tongue and mouth became very swollen, was seen at [facility] for this in 2014.    Current Outpatient Medications on File Prior to Visit  Medication Sig Dispense  Refill   acetaminophen  (TYLENOL ) 325 MG tablet Take 2 tablets (650 mg total) by mouth every 6 (six) hours as needed for mild pain or headache (fever >/= 101).     amLODipine  (NORVASC ) 2.5 MG tablet Take 2.5 mg by mouth.     hydrALAZINE  (APRESOLINE ) 25 MG tablet 10 mg.     hydrochlorothiazide  (HYDRODIURIL ) 25 MG tablet 1/2 tab by mouth twice per day 90 tablet 3   lisinopril  (ZESTRIL ) 5 MG tablet Take 5 mg by mouth.     Multiple Vitamin tablet Take 1 tablet by mouth every morning.     polyethylene glycol powder (GLYCOLAX /MIRALAX ) 17 GM/SCOOP powder Take 17 g by mouth.     pyridOXINE (B-6) 50 MG  tablet Take 50 mg by mouth.     senna-docusate (SENOKOT-S) 8.6-50 MG tablet Take by mouth.     vitamin B-12 (CYANOCOBALAMIN ) 1000 MCG tablet Take 1,000 mcg by mouth daily.     No current facility-administered medications on file prior to visit.        ROS:  All others reviewed and negative.  Objective        PE:  BP (!) 174/102   Pulse 60   Temp 98 F (36.7 C) (Oral)   Ht 5' 11 (1.803 m)   Wt 196 lb (88.9 kg)   SpO2 96%   BMI 27.34 kg/m                 Constitutional: Pt appears in NAD               HENT: Head: NCAT.                Right Ear: External ear normal.                 Left Ear: External ear normal.                Eyes: . Pupils are equal, round, and reactive to light. Conjunctivae and EOM are normal               Nose: without d/c or deformity               Neck: Neck supple. Gross normal ROM               Cardiovascular: Normal rate and regular rhythm.                 Pulmonary/Chest: Effort normal and breath sounds without rales or wheezing.                Abd:  Soft, NT, ND, + BS, no organomegaly               Neurological: Pt is alert. At baseline orientation, motor grossly intact               Skin: Skin is warm. No rashes, no other new lesions, LE edema - none               Psychiatric: Pt behavior is normal without agitation   Micro: none  Cardiac tracings I have personally interpreted today:  none  Pertinent Radiological findings (summarize): none   Lab Results  Component Value Date   WBC 3.3 (L) 12/20/2022   HGB 12.7 (L) 12/20/2022   HCT 39.2 12/20/2022   PLT 123.0 (L) 12/20/2022   GLUCOSE 85 12/20/2022   CHOL 135 12/20/2022   TRIG 62.0 12/20/2022   HDL 60.90 12/20/2022   LDLCALC  62 12/20/2022   ALT 18 12/20/2022   AST 23 12/20/2022   NA 140 12/20/2022   K 4.2 12/20/2022   CL 102 12/20/2022   CREATININE 1.28 12/20/2022   BUN 10 12/20/2022   CO2 32 12/20/2022   TSH 1.23 12/20/2022   PSA 0.68 06/22/2021   HGBA1C 5.5 12/20/2022    Assessment/Plan:  [name] is a 78 y.o. Black or African American [2] male with  has a past medical history of Acute hypoxemic respiratory failure due to COVID-19 (11/18/2019), Acute metabolic encephalopathy (11/21/2019), Chronic constipation, CKD (chronic kidney disease), Cognitive deficits following cerebrovascular disease (06/08/2020), Essential hypertension (02/17/2010), History of COVID-19 (11/19/2019), Hyperbilirubinemia, Hypotension (11/18/2019), Hypoxia (11/21/2019), Leukocytosis (10/10/2019), Major depressive disorder (12/30/2013), Personal history of colonic polyps (12/30/2013), PTSD (post-traumatic stress disorder), Thrombocytopenia (10/10/2019), Tubular adenoma of colon (2014), Visual hallucinations (03/2020), and Vitamin D  deficiency (08/07/2019).  Encounter for well adult exam with abnormal findings Age and sex appropriate education and counseling updated with regular exercise and diet Referrals for preventative services - for colonoscopy Immunizations addressed - for flu shot, shingrix , prevnar 20 at pharmacy Smoking counseling  - none needed Evidence for depression or other mood disorder - none significant Most recent labs reviewed. I have personally reviewed and have noted: 1) the patient's medical and social history 2) The patient's current medications and supplements 3) The patient's height, weight, and BMI have been recorded in the chart   Vitamin D  deficiency Last vitamin D  Lab Results  Component Value Date   VD25OH 57.13 12/20/2022   Stable, cont oral replacement   Urinary frequency Also for repeat UA and cx, for empiric cephalexin  500 tid course,  to f/u any worsening symptoms or concerns  Thrombocytopenia Chronic mild stable, no overt bleeding  Labile hypertension BP Readings from Last 3 Encounters:  12/05/23 (!) 174/102  12/20/22 132/84  09/19/22 (!) 134/90   Uncontrolled today but labile at home, pt and wife declines any changes, pt to  continue medical treatment norvasc  2.5 mg every day, hydralazine  25 tid, hct 12.5 bid, lisinopril  5 qd    Insomnia With recent mild worsening, for otc melatonin up to 10 mg at bedtime prn  CKD (chronic kidney disease), stage III Lab Results  Component Value Date   CREATININE 1.28 12/20/2022   Stable overall, cont to avoid nephrotoxins   Gait disorder Also for referral PT  Followup: Return in about 6 months (around 06/06/2024).  Casey Rush, MD 12/05/2023 8:04 PM [facility]

## 2023-12-05 NOTE — Assessment & Plan Note (Signed)
Lab Results  Component Value Date   CREATININE 1.28 12/20/2022   Stable overall, cont to avoid nephrotoxins

## 2023-12-05 NOTE — Assessment & Plan Note (Signed)
 BP Readings from Last 3 Encounters:  12/05/23 (!) 174/102  12/20/22 132/84  09/19/22 (!) 134/90   Uncontrolled today but labile at home, pt and wife declines any changes, pt to continue medical treatment norvasc  2.5 mg every day, hydralazine  25 tid, hct 12.5 bid, lisinopril  5 qd

## 2023-12-05 NOTE — Assessment & Plan Note (Signed)
Last vitamin D Lab Results  Component Value Date   VD25OH 57.13 12/20/2022   Stable, cont oral replacement

## 2023-12-05 NOTE — Patient Instructions (Signed)
 Please take all new medication as prescribed - the antibiotic  Ok to take up to 10 mg melatonin at bedtime to help with sleep  Please continue all other medications as before, and refills have been done if requested.  Please have the pharmacy call with any other refills you may need.  Please continue your efforts at being more active, low cholesterol diet, and weight control.  You are otherwise up to date with prevention measures today.  Please keep your appointments with your specialists as you may have planned  You will be contacted regarding the referral for: Physical Therapy, and Colonoscopy  Please go to the LAB at the blood drawing area for the tests to be done - just the urine testing today  You will be contacted by phone if any changes need to be made immediately.  Otherwise, you will receive a letter about your results with an explanation, but please check with MyChart first.  Please make an Appointment to return in 6 months, or sooner if needed

## 2023-12-05 NOTE — Assessment & Plan Note (Addendum)
 Also for repeat UA and cx, for empiric cephalexin  500 tid course,  to f/u any worsening symptoms or concerns

## 2023-12-08 LAB — URINE CULTURE

## 2023-12-10 ENCOUNTER — Encounter: Payer: Self-pay | Admitting: Gastroenterology

## 2023-12-19 ENCOUNTER — Telehealth: Payer: Self-pay | Admitting: Radiology

## 2023-12-19 NOTE — Telephone Encounter (Signed)
 Copied from CRM #[id number]. Topic: General - Phone/Fax/Address >> Dec 19, 2023 10:35 AM Zy'onna H wrote: Patients representative is calling for clinic's fax number to send over Medical forms to   Norleen Lynwood ORN, MD/his team on behalf of the patient.  At the time of the faxed documents, can someone from the clinic inform the brother of Mr. Todorov if he has to come and pick up these forms or if they can be faxed back to him.   Callback Number: [phone] PCP/PCP Team please advise.

## 2023-12-20 ENCOUNTER — Telehealth: Payer: Self-pay | Admitting: Internal Medicine

## 2023-12-20 NOTE — Telephone Encounter (Signed)
 Paperwork was dropped off by pt brother the paperwork is from [place] dep of [employer] conservator/restorer. Once paperwork is completed please reach out to the pt brother Donna @ [phone] for pick up. Please advise, Thanks

## 2023-12-20 NOTE — Telephone Encounter (Signed)
 Called number provided and he noted he wasn't exactly sure what the other forms noted were. Was able to inform him that we received a Medical Consultation Form which was completed and placed up front for pick up.

## 2023-12-20 NOTE — Telephone Encounter (Signed)
 [name], brother called back, and asked for the forms to be faxed to [name], the benefits consultant, at [facility] [employer].  The provided fax # is [phone].

## 2023-12-24 NOTE — Telephone Encounter (Signed)
 Form received. Will review with supervisor for completion

## 2023-12-31 ENCOUNTER — Ambulatory Visit: Attending: Internal Medicine | Admitting: Physical Therapy

## 2023-12-31 VITALS — BP 157/82 | HR 67

## 2023-12-31 DIAGNOSIS — R269 Unspecified abnormalities of gait and mobility: Secondary | ICD-10-CM | POA: Insufficient documentation

## 2023-12-31 DIAGNOSIS — M6281 Muscle weakness (generalized): Secondary | ICD-10-CM | POA: Diagnosis present

## 2023-12-31 DIAGNOSIS — R2689 Other abnormalities of gait and mobility: Secondary | ICD-10-CM | POA: Insufficient documentation

## 2023-12-31 NOTE — Therapy (Unsigned)
 OUTPATIENT PHYSICAL THERAPY NEURO EVALUATION   Patient Name: [name] MRN: [id number] DOB:February 22, 1946, 78 y.o., male Today's Date: 01/01/2024   PCP: Norleen Lynwood ORN, MD REFERRING PROVIDER: Norleen Lynwood ORN, MD  END OF SESSION:  PT End of Session - 01/01/24 1941     Visit Number 1    Authorization Type [employer]    PT Start Time 1102    PT Stop Time 1155    PT Time Calculation (min) 53 min    Equipment Utilized During Treatment Gait belt    Activity Tolerance Patient limited by lethargy    Behavior During Therapy Flat affect   Groggy/drowsiness         Past Medical History:  Diagnosis Date   Acute hypoxemic respiratory failure due to COVID-19 11/18/2019   Acute metabolic encephalopathy 11/21/2019   related to COVID-19 infection   Chronic constipation    CKD (chronic kidney disease)    stage III   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 Currently on nifedipine  30 mg daily. Serial BPs: -02/03/13: 118/82.     History of COVID-19 11/19/2019   Hyperbilirubinemia    Hypotension 11/18/2019   Hypoxia 11/21/2019   related to COVID-19 infection   Leukocytosis 10/10/2019   Major depressive disorder 12/30/2013   Personal history of colonic polyps 12/30/2013   Per colonoscopy at [facility] per pt, about sept 2014   PTSD (post-traumatic stress disorder)    saw combat during [place] war   Thrombocytopenia 10/10/2019   Tubular adenoma of colon 2014   Visual hallucinations 03/2020   Vitamin D  deficiency 08/07/2019   Past Surgical History:  Procedure Laterality Date   COLONOSCOPY     MUSCLE RECESSION AND RESECTION Left 12/25/2018   Procedure: INFERIOR OBLIQUE RECESSION LEFT EYE, SUPERIOR OBLIQUE TUCK LEFT EYE;  Surgeon: Jacques Sharper, MD;  Location: [facility];  Service: Ophthalmology;  Laterality: Left;    UPPER GASTROINTESTINAL ENDOSCOPY     Patient Active Problem List   Diagnosis Date Noted   Gait disorder 12/05/2023   Urinary frequency 05/31/2022   Bradycardia 03/02/2022   BPH (benign prostatic hyperplasia) 12/22/2021   Urinary incontinence, male, stress 12/22/2021   AKI (acute kidney injury) (HCC) 08/07/2021   Encounter for well adult exam with abnormal findings 06/22/2021   Low back pain 06/22/2021   Insomnia 08/03/2020   Cervical spinal stenosis 07/19/2020   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   History of colonic polyp    Hyperbilirubinemia    CKD (chronic kidney disease), stage III 11/21/2019   Acute metabolic encephalopathy 11/21/2019   History of COVID-19 11/21/2019   Thrombocytopenia 10/10/2019   Vitamin D  deficiency 08/07/2019   Chronic constipation 09/16/2018   Erectile dysfunction 07/11/2017   Major depressive disorder 12/30/2013   History of colonic polyps 12/30/2013   PTSD (post-traumatic stress disorder) 02/03/2013   Labile hypertension 02/17/2010    ONSET DATE: 09-16-23  REFERRING DIAG:  Diagnosis  R26.9 (ICD-10-CM) - Gait disorder    THERAPY DIAG:  Other abnormalities of gait and mobility  Muscle weakness (generalized)  Rationale for Evaluation and Treatment: Rehabilitation  SUBJECTIVE:  SUBJECTIVE STATEMENT: Pt presents to PT eval in manual wheelchair - pt very groggy/sleepy - unable to answer questions - does not appear to be oriented to date/time/place.  Accompanied by friend - she states W/c use started in Feb. 2025 ; lives in [facility] in [place] - has lived in this facility since July Pt accompanied by: friend - Engineer, maintenance; has CNA also  PERTINENT HISTORY: hospitalized at [facility] 6-1 - 09-28-23 with acute encephalopathy  Stage 3  chronic kidney disease, unspecified whether stage 3a or 3b CKD   Cognitive deficits following cerebrovascular disease   Neurogenic orthostatic hypotension   PAIN:  Are you having pain? No  PRECAUTIONS: Fall  RED FLAGS: None   WEIGHT BEARING RESTRICTIONS: No  FALLS: Has patient fallen in last 6 months? No  LIVING ENVIRONMENT: Lives with: lives in a skilled nursing facility and pt's friend, Sherran, reports that pt is living at [facility] (SNF?)  - pt very groggy/lethargic - unable to answer questions Lives in: Other in [facility] facility - SNF? Stairs: No Has following equipment at home: Vannie - 2 wheeled, Wheelchair (manual), and shower chair  PLOF: Needs assistance with ADLs, Needs assistance with gait, and Needs assistance with transfers  PATIENT GOALS: get back on his feet and smile again - states his friend, Sherran - pt unable to state goals  OBJECTIVE:  Note: Objective measures were completed at Evaluation unless otherwise noted.    COGNITION: Overall cognitive status: Impaired   SENSATION: Not tested  COORDINATION: Not tested  POSTURE: rounded shoulders, forward head, and increased thoracic kyphosis  LOWER EXTREMITY ROM:   WFL's   LOWER EXTREMITY MMT:    MMT Right Eval Left Eval  Hip flexion 4 4  Hip extension    Hip abduction    Hip adduction    Hip internal rotation    Hip external rotation    Knee flexion    Knee extension 5 5  Ankle dorsiflexion    Ankle plantarflexion    Ankle inversion    Ankle eversion    (Blank rows = not tested)  BED MOBILITY:  Not tested  TRANSFERS: Sit to stand: CGA  Assistive device utilized: Environmental consultant - 2 wheeled     Stand to sit: CGA  Assistive device utilized: Environmental consultant - 2 wheeled      GAIT:  Findings: Gait Characteristics: step through pattern, decreased step length- Right, decreased step length- Left, shuffling, trunk flexed, and narrow BOS, Distance walked: 50', 60' , Assistive device utilized:Walker - 2 wheeled,  Level of assistance: Min A and Mod A, and Comments: 2nd person assisted for safety; assisted in controlling RW as pt pushed RW too far ahead                                                                                                                                TREATMENT DATE: 12-31-23    PATIENT EDUCATION: Education details: eval results discussed with  caregiver Person educated: Caregiver pt too drowsy and too cognitively impaired to answer questions in today's eval Education method: Explanation Education comprehension: verbalized understanding  HOME EXERCISE PROGRAM: To be established   GOALS: Messaged received from front office staff that pt's caregiver cancelled all appts scheduled - stating that pt would be receiving PT at the [facility];  goals not written due to pt not going to return for PT services at this facility -  per message received, PT appts were cancelled approx 1 hour after they were scheduled   ASSESSMENT:  CLINICAL IMPRESSION: Patient is a 78 y.o. gentleman who was seen today for physical therapy evaluation and treatment for gait and balance impairments due to acute encephalopathy with hospitalization 09-16-23 - 09-28-23.  Pt very groggy/drowsy during PT evaluation and not oriented to date, time or place.  Unable to provide history - friend, Sherran, providing information.  Pt gait trained with RW approx. 40' x 1 rep and 45' x 1 rep with +2 mod assist and controlling RW to prevent pt from pushing it too far in front of him.  Pt had difficulty following commands/directions with MMT.  PT appts were scheduled but message was received that caregiver called back approx. 1 hour later and cancelled all scheduled appts, stating he would be getting PT at the [facility].  Goals were not established due to pt not returning to this facility for follow up PT services.    OBJECTIVE IMPAIRMENTS: Abnormal gait, decreased activity tolerance, decreased balance, decreased endurance, decreased knowledge  of use of DME, decreased strength, and decreased safety awareness.   ACTIVITY LIMITATIONS: carrying, lifting, bending, standing, squatting, stairs, transfers, and locomotion level  PARTICIPATION LIMITATIONS: meal prep, cleaning, laundry, shopping, and community activity  PERSONAL FACTORS: Behavior pattern, Past/current experiences, Time since onset of injury/illness/exacerbation, Transportation, and cognitive deficits are also affecting patient's functional outcome.   REHAB POTENTIAL: Fair due to severity of deficits including cognitive impairments  CLINICAL DECISION MAKING: Evolving/moderate complexity  EVALUATION COMPLEXITY: Moderate  PLAN:  PT FREQUENCY: one time visit per message received stating all PT appts had been cancelled at this facility and pt would be receiving PT at the [facility] center  PT DURATION: 1 week  PLANNED INTERVENTIONS: 97110-Therapeutic exercises, 97530- Therapeutic activity, 97112- Neuromuscular re-education, 97535- Self Care, 02883- Gait training, and Patient/Family education  PLAN FOR NEXT SESSION: N/A - eval only due to pt not returning   Kenshawn Maciolek Suzanne, PT 01/01/2024, 7:45 PM

## 2024-01-01 ENCOUNTER — Encounter: Payer: Self-pay | Admitting: Physical Therapy

## 2024-01-02 NOTE — Telephone Encounter (Signed)
 Questions and additional details needed to complete forms. Paperwork given to Dr. Norleen to review and complete.

## 2024-01-07 NOTE — Telephone Encounter (Signed)
 Patient brother, Donna, contacted and made aware that paperwork is completed and verified fax number. Copies left at front desk for pickup by family.   AttnBETHA [name] Faxed to: [phone] and [phone].

## 2024-01-15 ENCOUNTER — Ambulatory Visit: Admitting: Physical Therapy

## 2024-01-22 ENCOUNTER — Ambulatory Visit: Admitting: Physical Therapy

## 2024-01-29 ENCOUNTER — Ambulatory Visit: Admitting: Physical Therapy

## 2024-01-31 ENCOUNTER — Ambulatory Visit: Admitting: Gastroenterology

## 2024-02-05 ENCOUNTER — Ambulatory Visit: Admitting: Physical Therapy

## 2024-02-11 ENCOUNTER — Ambulatory Visit: Admitting: Physical Therapy

## 2024-02-19 ENCOUNTER — Ambulatory Visit: Admitting: Physical Therapy

## 2024-02-26 ENCOUNTER — Ambulatory Visit: Admitting: Physical Therapy

## 2024-03-04 ENCOUNTER — Ambulatory Visit: Admitting: Physical Therapy

## 2024-03-05 ENCOUNTER — Encounter: Payer: Self-pay | Admitting: Internal Medicine

## 2024-03-10 ENCOUNTER — Ambulatory Visit: Admitting: Physical Therapy

## 2024-05-22 ENCOUNTER — Telehealth: Payer: Self-pay

## 2024-05-22 NOTE — Telephone Encounter (Addendum)
 Received paperwork from [facility] for patients continued leave from work.  Leave Consultant: Annamaria Loupe Phone: [phone]  Completed using previous documentation from patients chart and faxed over to Ms. Loupe [phone].   Reached out to patient, spoke with Ms. Anabel Elbe, okay per DPR. She informed me he was in a facility and that it was okay to share her number with Ms. Kamora and send over paperwork on his behalf. Let her know that they were also neededing to speak with him to complete some forms before moving to the next step. She agreed.  Left detailed message for Ms. Annamaria of best contact number.

## 2024-08-18 ENCOUNTER — Encounter: Admitting: Internal Medicine
